# Patient Record
Sex: Female | Born: 1935
Health system: Southern US, Community
[De-identification: ages and names within clinical notes are randomized; demographics above are authoritative.]

## PROBLEM LIST (undated history)

## (undated) DIAGNOSIS — IMO0001 Reserved for inherently not codable concepts without codable children: Secondary | ICD-10-CM

## (undated) DIAGNOSIS — K5792 Diverticulitis of intestine, part unspecified, without perforation or abscess without bleeding: Secondary | ICD-10-CM

## (undated) DIAGNOSIS — I509 Heart failure, unspecified: Secondary | ICD-10-CM

## (undated) HISTORY — DX: Diverticulitis of intestine, part unspecified, without perforation or abscess without bleeding: K57.92

## (undated) HISTORY — PX: HAMMER TOE SURGERY: SHX385

## (undated) HISTORY — PX: CERVICAL CONE BIOPSY: SUR198

---

## 2008-07-22 ENCOUNTER — Emergency Department (HOSPITAL_COMMUNITY): Admission: EM | Admit: 2008-07-22 | Discharge: 2008-07-22 | Payer: Self-pay | Admitting: Emergency Medicine

## 2010-10-14 LAB — POCT I-STAT, CHEM 8
Calcium, Ion: 1.14 mmol/L (ref 1.12–1.32)
Creatinine, Ser: 1 mg/dL (ref 0.4–1.2)
Glucose, Bld: 96 mg/dL (ref 70–99)
Hemoglobin: 14.6 g/dL (ref 12.0–15.0)
Sodium: 135 mEq/L (ref 135–145)
TCO2: 25 mmol/L (ref 0–100)

## 2010-10-14 LAB — COMPREHENSIVE METABOLIC PANEL
ALT: 28 U/L (ref 0–35)
AST: 30 U/L (ref 0–37)
CO2: 26 mEq/L (ref 19–32)
Calcium: 9 mg/dL (ref 8.4–10.5)
GFR calc Af Amer: >60 mL/min (ref >60)
GFR calc non Af Amer: >60 mL/min (ref >60)
Sodium: 132 mEq/L — ABNORMAL LOW (ref 135–145)

## 2010-10-14 LAB — DIFFERENTIAL
Eosinophils Absolute: 0 10*3/uL (ref 0.0–0.7)
Eosinophils Relative: 0 % (ref 0–5)
Lymphs Abs: 1.4 10*3/uL (ref 0.7–4.0)
Monocytes Absolute: 0.5 10*3/uL (ref 0.1–1.0)
Monocytes Relative: 5 % (ref 3–12)

## 2010-10-14 LAB — POCT URINALYSIS DIP (DEVICE)
Glucose, UA: NEGATIVE mg/dL
Nitrite: NEGATIVE
Urobilinogen, UA: 1 mg/dL (ref 0.0–1.0)

## 2010-10-14 LAB — CBC
MCHC: 33.5 g/dL (ref 30.0–36.0)
RBC: 4.57 MIL/uL (ref 3.87–5.11)
WBC: 10.2 10*3/uL (ref 4.0–10.5)

## 2015-01-26 ENCOUNTER — Encounter (HOSPITAL_COMMUNITY)
Admission: RE | Admit: 2015-01-26 | Discharge: 2015-01-26 | Disposition: A | Discharge status: Home / Self Care | Payer: Medicare Other | Source: Ambulatory Visit | Attending: Orthopedic Surgery | Admitting: Orthopedic Surgery

## 2015-01-26 ENCOUNTER — Encounter (HOSPITAL_COMMUNITY): Payer: Self-pay

## 2015-01-26 DIAGNOSIS — S82002A Unspecified fracture of left patella, initial encounter for closed fracture: Secondary | ICD-10-CM | POA: Insufficient documentation

## 2015-01-26 DIAGNOSIS — X58XXXA Exposure to other specified factors, initial encounter: Secondary | ICD-10-CM | POA: Insufficient documentation

## 2015-01-26 DIAGNOSIS — Z01812 Encounter for preprocedural laboratory examination: Secondary | ICD-10-CM | POA: Insufficient documentation

## 2015-01-26 HISTORY — DX: Reserved for inherently not codable concepts without codable children: IMO0001

## 2015-01-26 LAB — URINALYSIS, ROUTINE W REFLEX MICROSCOPIC
Bilirubin Urine: NEGATIVE
Glucose, UA: NEGATIVE mg/dL
Ketones, ur: NEGATIVE mg/dL
Leukocytes, UA: NEGATIVE
Nitrite: NEGATIVE
Protein, ur: NEGATIVE mg/dL
Specific Gravity, Urine: 1.007 (ref 1.005–1.030)
UROBILINOGEN UA: 0.2 mg/dL (ref 0.0–1.0)
pH: 6.5 (ref 5.0–8.0)

## 2015-01-26 LAB — BASIC METABOLIC PANEL
ANION GAP: 5 (ref 5–15)
BUN: 14 mg/dL (ref 6–20)
CALCIUM: 9.4 mg/dL (ref 8.9–10.3)
CHLORIDE: 105 mmol/L (ref 101–111)
CO2: 28 mmol/L (ref 22–32)
Creatinine, Ser: 0.66 mg/dL (ref 0.44–1.00)
GFR calc non Af Amer: >60 mL/min (ref >60)
GLUCOSE: 100 mg/dL — AB (ref 65–99)
POTASSIUM: 4 mmol/L (ref 3.5–5.1)
SODIUM: 138 mmol/L (ref 135–145)

## 2015-01-26 LAB — CBC
HCT: 37.9 % (ref 36.0–46.0)
Hemoglobin: 12.5 g/dL (ref 12.0–15.0)
MCH: 31 pg (ref 26.0–34.0)
MCHC: 33 g/dL (ref 30.0–36.0)
MCV: 94 fL (ref 78.0–100.0)
Platelets: 290 10*3/uL (ref 150–400)
RBC: 4.03 MIL/uL (ref 3.87–5.11)
RDW: 13.1 % (ref 11.5–15.5)
WBC: 4.3 10*3/uL (ref 4.0–10.5)

## 2015-01-26 LAB — PROTIME-INR
INR: 1.04 (ref 0.00–1.49)
PROTHROMBIN TIME: 13.8 s (ref 11.6–15.2)

## 2015-01-26 LAB — URINE MICROSCOPIC-ADD ON

## 2015-01-26 LAB — ABO/RH: ABO/RH(D): O POS

## 2015-01-26 LAB — APTT: aPTT: 30 seconds (ref 24–37)

## 2015-01-26 NOTE — Patient Instructions (Addendum)
Sabrina Hernandez  01/26/2015   Your procedure is scheduled on: 01/29/2015    Report to [facility] Main  Entrance take [facility]  elevators to 3rd floor to  [place] at    0930 AM.  Call this number if you have problems the morning of surgery [phone]   Remember: ONLY 1 PERSON MAY GO WITH YOU TO SHORT STAY TO GET  READY MORNING OF [place].  Do not eat food or drink liquids :After Midnight.     Take these medicines the morning of surgery with A SIP OF WATER: none                                You may not have any metal on your body including hair pins and              piercings  Do not wear jewelry, make-up, lotions, powders or perfumes, deodorant             Do not wear nail polish.  Do not shave  48 hours prior to surgery.               Do not bring valuables to the hospital. [place].  Contacts, dentures or bridgework may not be worn into surgery.  Leave suitcase in the car. After surgery it may be brought to your room.     Special Instructions:   Coughing and deep breathing exercises, leg exercises               Please read over the following fact sheets you were given: _____________________________________________________________________             [facility] - Preparing for Surgery Before surgery, you can play an important role.  Because skin is not sterile, your skin needs to be as free of germs as possible.  You can reduce the number of germs on your skin by washing with CHG (chlorahexidine gluconate) soap before surgery.  CHG is an antiseptic cleaner which kills germs and bonds with the skin to continue killing germs even after washing. Please DO NOT use if you have an allergy to CHG or antibacterial soaps.  If your skin becomes reddened/irritated stop using the CHG and inform your nurse when you arrive at Short Stay. Do not shave (including legs and underarms) for at least 48 hours prior  to the first CHG shower.  You may shave your face/neck. Please follow these instructions carefully:  1.  Shower with CHG Soap the night before surgery and the  morning of Surgery.  2.  If you choose to wash your hair, wash your hair first as usual with your  normal  shampoo.  3.  After you shampoo, rinse your hair and body thoroughly to remove the  shampoo.                           4.  Use CHG as you would any other liquid soap.  You can apply chg directly  to the skin and wash                       Gently with a scrungie or clean washcloth.  5.  Apply the CHG Soap to your body ONLY FROM THE NECK DOWN.   Do not use on face/ open                           Wound or open sores. Avoid contact with eyes, ears mouth and genitals (private parts).                       Wash face,  Genitals (private parts) with your normal soap.             6.  Wash thoroughly, paying special attention to the area where your surgery  will be performed.  7.  Thoroughly rinse your body with warm water from the neck down.  8.  DO NOT shower/wash with your normal soap after using and rinsing off  the CHG Soap.                9.  Pat yourself dry with a clean towel.            10.  Wear clean pajamas.            11.  Place clean sheets on your bed the night of your first shower and do not  sleep with pets. Day of Surgery : Do not apply any lotions/deodorants the morning of surgery.  Please wear clean clothes to the hospital/surgery center.  FAILURE TO FOLLOW THESE INSTRUCTIONS MAY RESULT IN THE CANCELLATION OF YOUR SURGERY PATIENT SIGNATURE_________________________________  NURSE SIGNATURE__________________________________  ________________________________________________________________________  WHAT IS A BLOOD TRANSFUSION? Blood Transfusion Information  A transfusion is the replacement of blood or some of its parts. Blood is made up of multiple cells which provide different functions.  Red blood cells carry oxygen  and are used for blood loss replacement.  White blood cells fight against infection.  Platelets control bleeding.  Plasma helps clot blood.  Other blood products are available for specialized needs, such as hemophilia or other clotting disorders. BEFORE THE TRANSFUSION  Who gives blood for transfusions?   Healthy volunteers who are fully evaluated to make sure their blood is safe. This is blood bank blood. Transfusion therapy is the safest it has ever been in the practice of medicine. Before blood is taken from a donor, a complete history is taken to make sure that person has no history of diseases nor engages in risky social behavior (examples are intravenous drug use or sexual activity with multiple partners). The donor's travel history is screened to minimize risk of transmitting infections, such as malaria. The donated blood is tested for signs of infectious diseases, such as HIV and hepatitis. The blood is then tested to be sure it is compatible with you in order to minimize the chance of a transfusion reaction. If you or a relative donates blood, this is often done in anticipation of surgery and is not appropriate for emergency situations. It takes many days to process the donated blood. RISKS AND COMPLICATIONS Although transfusion therapy is very safe and saves many lives, the main dangers of transfusion include:  1. Getting an infectious disease. 2. Developing a transfusion reaction. This is an allergic reaction to something in the blood you were given. Every precaution is taken to prevent this. The decision to have a blood transfusion has been considered carefully by your caregiver before blood is given. Blood is not given unless the benefits outweigh the risks. AFTER THE TRANSFUSION  Right after  receiving a blood transfusion, you will usually feel much better and more energetic. This is especially true if your red blood cells have gotten low (anemic). The transfusion raises the level  of the red blood cells which carry oxygen, and this usually causes an energy increase.  The nurse administering the transfusion will monitor you carefully for complications. HOME CARE INSTRUCTIONS  No special instructions are needed after a transfusion. You may find your energy is better. Speak with your caregiver about any limitations on activity for underlying diseases you may have. SEEK MEDICAL CARE IF:   Your condition is not improving after your transfusion.  You develop redness or irritation at the intravenous (IV) site. SEEK IMMEDIATE MEDICAL CARE IF:  Any of the following symptoms occur over the next 12 hours:  Shaking chills.  You have a temperature by mouth above 102 F (38.9 C), not controlled by medicine.  Chest, back, or muscle pain.  People around you feel you are not acting correctly or are confused.  Shortness of breath or difficulty breathing.  Dizziness and fainting.  You get a rash or develop hives.  You have a decrease in urine output.  Your urine turns a dark color or changes to pink, red, or brown. Any of the following symptoms occur over the next 10 days:  You have a temperature by mouth above 102 F (38.9 C), not controlled by medicine.  Shortness of breath.  Weakness after normal activity.  The white part of the eye turns yellow (jaundice).  You have a decrease in the amount of urine or are urinating less often.  Your urine turns a dark color or changes to pink, red, or brown. Document Released: 06/13/2000 Document Revised: 09/08/2011 Document Reviewed: 01/31/2008 ExitCare Patient Information 2014 [place].  _______________________________________________________________________  Incentive Spirometer  An incentive spirometer is a tool that can help keep your lungs clear and active. This tool measures how well you are filling your lungs with each breath. Taking long deep breaths may help reverse or decrease the chance of developing  breathing (pulmonary) problems (especially infection) following:  A long period of time when you are unable to move or be active. BEFORE THE PROCEDURE   If the spirometer includes an indicator to show your best effort, your nurse or respiratory therapist will set it to a desired goal.  If possible, sit up straight or lean slightly forward. Try not to slouch.  Hold the incentive spirometer in an upright position. INSTRUCTIONS FOR USE  3. Sit on the edge of your bed if possible, or sit up as far as you can in bed or on a chair. 4. Hold the incentive spirometer in an upright position. 5. Breathe out normally. 6. Place the mouthpiece in your mouth and seal your lips tightly around it. 7. Breathe in slowly and as deeply as possible, raising the piston or the ball toward the top of the column. 8. Hold your breath for 3-5 seconds or for as long as possible. Allow the piston or ball to fall to the bottom of the column. 9. Remove the mouthpiece from your mouth and breathe out normally. 10. Rest for a few seconds and repeat Steps 1 through 7 at least 10 times every 1-2 hours when you are awake. Take your time and take a few normal breaths between deep breaths. 11. The spirometer may include an indicator to show your best effort. Use the indicator as a goal to work toward during each repetition. 12. After each set  of 10 deep breaths, practice coughing to be sure your lungs are clear. If you have an incision (the cut made at the time of surgery), support your incision when coughing by placing a pillow or rolled up towels firmly against it. Once you are able to get out of bed, walk around indoors and cough well. You may stop using the incentive spirometer when instructed by your caregiver.  RISKS AND COMPLICATIONS  Take your time so you do not get dizzy or light-headed.  If you are in pain, you may need to take or ask for pain medication before doing incentive spirometry. It is harder to take a deep  breath if you are having pain. AFTER USE  Rest and breathe slowly and easily.  It can be helpful to keep track of a log of your progress. Your caregiver can provide you with a simple table to help with this. If you are using the spirometer at home, follow these instructions: [place] IF:   You are having difficultly using the spirometer.  You have trouble using the spirometer as often as instructed.  Your pain medication is not giving enough relief while using the spirometer.  You develop fever of 100.5 F (38.1 C) or higher. SEEK IMMEDIATE MEDICAL CARE IF:   You cough up bloody sputum that had not been present before.  You develop fever of 102 F (38.9 C) or greater.  You develop worsening pain at or near the incision site. MAKE SURE YOU:   Understand these instructions.  Will watch your condition.  Will get help right away if you are not doing well or get worse. Document Released: 10/27/2006 Document Revised: 09/08/2011 Document Reviewed: 12/28/2006 [facility] Patient Information 2014 [place], [place].   ________________________________________________________________________
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
## 2015-01-29 ENCOUNTER — Ambulatory Visit (HOSPITAL_COMMUNITY): Payer: Medicare Other

## 2015-01-29 ENCOUNTER — Encounter (HOSPITAL_COMMUNITY): Payer: Self-pay | Admitting: *Deleted

## 2015-01-29 ENCOUNTER — Encounter (HOSPITAL_COMMUNITY)
Admission: RE | Disposition: A | Discharge status: Home Health | Payer: Self-pay | Source: Ambulatory Visit | Attending: Orthopedic Surgery

## 2015-01-29 ENCOUNTER — Inpatient Hospital Stay (HOSPITAL_COMMUNITY)
Admission: RE | Admit: 2015-01-29 | Discharge: 2015-01-30 | DRG: 517 | Disposition: A | Discharge status: Home Health | Payer: Medicare Other | Source: Ambulatory Visit | Attending: Orthopedic Surgery | Admitting: Orthopedic Surgery

## 2015-01-29 ENCOUNTER — Ambulatory Visit (HOSPITAL_COMMUNITY): Payer: Medicare Other | Admitting: Certified Registered Nurse Anesthetist

## 2015-01-29 DIAGNOSIS — S82042A Displaced comminuted fracture of left patella, initial encounter for closed fracture: Principal | ICD-10-CM | POA: Diagnosis present

## 2015-01-29 DIAGNOSIS — S82002S Unspecified fracture of left patella, sequela: Secondary | ICD-10-CM

## 2015-01-29 DIAGNOSIS — Z87891 Personal history of nicotine dependence: Secondary | ICD-10-CM | POA: Diagnosis not present

## 2015-01-29 DIAGNOSIS — T148XXA Other injury of unspecified body region, initial encounter: Secondary | ICD-10-CM

## 2015-01-29 DIAGNOSIS — Z7982 Long term (current) use of aspirin: Secondary | ICD-10-CM | POA: Diagnosis not present

## 2015-01-29 DIAGNOSIS — S82002A Unspecified fracture of left patella, initial encounter for closed fracture: Secondary | ICD-10-CM | POA: Diagnosis present

## 2015-01-29 DIAGNOSIS — Z01812 Encounter for preprocedural laboratory examination: Secondary | ICD-10-CM | POA: Diagnosis not present

## 2015-01-29 HISTORY — PX: ORIF PATELLA: SHX5033

## 2015-01-29 LAB — TYPE AND SCREEN
ABO/RH(D): O POS
ANTIBODY SCREEN: NEGATIVE

## 2015-01-29 SURGERY — OPEN REDUCTION INTERNAL FIXATION (ORIF) PATELLA
Anesthesia: Spinal | Site: Knee | Laterality: Left

## 2015-01-29 MED ORDER — DEXTROSE 5 % IV SOLN
500.0000 mg | Freq: Four times a day (QID) | INTRAVENOUS | Status: DC | PRN
  Filled 2015-01-29: qty 5

## 2015-01-29 MED ORDER — PROPOFOL 10 MG/ML IV BOLUS
INTRAVENOUS | Status: AC
  Filled 2015-01-29: qty 20

## 2015-01-29 MED ORDER — LIDOCAINE HCL (CARDIAC) 20 MG/ML IV SOLN
INTRAVENOUS | Status: DC | PRN
  Administered 2015-01-29: 50 mg via INTRAVENOUS

## 2015-01-29 MED ORDER — CARBOXYMETHYLCELLULOSE SODIUM 0.5 % OP SOLN: 2.0000 [drp] | Freq: Two times a day (BID) | OPHTHALMIC | Status: DC | PRN

## 2015-01-29 MED ORDER — POLYETHYLENE GLYCOL 3350 17 G PO PACK
17.0000 g | PACK | Freq: Two times a day (BID) | ORAL | Status: DC
  Administered 2015-01-30: 17 g via ORAL

## 2015-01-29 MED ORDER — SODIUM CHLORIDE 0.9 % IJ SOLN
INTRAMUSCULAR | Status: DC | PRN
  Administered 2015-01-29: 30 mL

## 2015-01-29 MED ORDER — CEFAZOLIN SODIUM-DEXTROSE 2-3 GM-% IV SOLR
INTRAVENOUS | Status: AC
  Filled 2015-01-29: qty 50

## 2015-01-29 MED ORDER — FERROUS SULFATE 325 (65 FE) MG PO TABS
325.0000 mg | ORAL_TABLET | Freq: Three times a day (TID) | ORAL | Status: DC
  Administered 2015-01-30: 325 mg via ORAL
  Filled 2015-01-29 (×5): qty 1

## 2015-01-29 MED ORDER — PROPOFOL 10 MG/ML IV BOLUS
INTRAVENOUS | Status: DC | PRN
  Administered 2015-01-29 (×2): 20 mg via INTRAVENOUS
  Administered 2015-01-29: 10 mg via INTRAVENOUS

## 2015-01-29 MED ORDER — PROPOFOL INFUSION 10 MG/ML OPTIME
INTRAVENOUS | Status: DC | PRN
  Administered 2015-01-29: 180 ug/kg/min via INTRAVENOUS

## 2015-01-29 MED ORDER — PHENOL 1.4 % MT LIQD
1.0000 | OROMUCOSAL | Status: DC | PRN
  Filled 2015-01-29: qty 177

## 2015-01-29 MED ORDER — BUPIVACAINE IN DEXTROSE 0.75-8.25 % IT SOLN
INTRATHECAL | Status: DC | PRN
  Administered 2015-01-29: 2 mL via INTRATHECAL

## 2015-01-29 MED ORDER — ONDANSETRON HCL 4 MG/2ML IJ SOLN
4.0000 mg | Freq: Four times a day (QID) | INTRAMUSCULAR | Status: DC | PRN
  Filled 2015-01-29: qty 2

## 2015-01-29 MED ORDER — HYDROCODONE-ACETAMINOPHEN 7.5-325 MG PO TABS
1.0000 | ORAL_TABLET | ORAL | Status: DC
  Administered 2015-01-29 – 2015-01-30 (×6): 1 via ORAL
  Filled 2015-01-29 (×5): qty 1
  Filled 2015-01-29: qty 2

## 2015-01-29 MED ORDER — LACTATED RINGERS IV SOLN
INTRAVENOUS | Status: DC
  Administered 2015-01-29: 1000 mL via INTRAVENOUS
  Administered 2015-01-29: 14:00:00 via INTRAVENOUS

## 2015-01-29 MED ORDER — ALUM & MAG HYDROXIDE-SIMETH 200-200-20 MG/5ML PO SUSP: 30.0000 mL | ORAL | Status: DC | PRN

## 2015-01-29 MED ORDER — LIDOCAINE HCL (CARDIAC) 20 MG/ML IV SOLN
INTRAVENOUS | Status: AC
  Filled 2015-01-29: qty 5

## 2015-01-29 MED ORDER — RIVAROXABAN 10 MG PO TABS
10.0000 mg | ORAL_TABLET | ORAL | Status: DC
  Administered 2015-01-30: 10 mg via ORAL
  Filled 2015-01-29 (×2): qty 1

## 2015-01-29 MED ORDER — CHLORHEXIDINE GLUCONATE 4 % EX LIQD: 60.0000 mL | Freq: Once | CUTANEOUS | Status: DC

## 2015-01-29 MED ORDER — HYDROMORPHONE HCL 1 MG/ML IJ SOLN
0.5000 mg | INTRAMUSCULAR | Status: DC | PRN
  Administered 2015-01-29 (×2): 1 mg via INTRAVENOUS
  Filled 2015-01-29 (×2): qty 1

## 2015-01-29 MED ORDER — CELECOXIB 200 MG PO CAPS
200.0000 mg | ORAL_CAPSULE | Freq: Two times a day (BID) | ORAL | Status: DC
  Administered 2015-01-29 – 2015-01-30 (×2): 200 mg via ORAL
  Filled 2015-01-29 (×3): qty 1

## 2015-01-29 MED ORDER — HYDROMORPHONE HCL 1 MG/ML IJ SOLN: 0.2500 mg | INTRAMUSCULAR | Status: DC | PRN

## 2015-01-29 MED ORDER — KETOROLAC TROMETHAMINE 30 MG/ML IJ SOLN
INTRAMUSCULAR | Status: AC
  Filled 2015-01-29: qty 1

## 2015-01-29 MED ORDER — DEXAMETHASONE SODIUM PHOSPHATE 10 MG/ML IJ SOLN: 10.0000 mg | Freq: Once | INTRAMUSCULAR | Status: DC

## 2015-01-29 MED ORDER — DOCUSATE SODIUM 100 MG PO CAPS
100.0000 mg | ORAL_CAPSULE | Freq: Two times a day (BID) | ORAL | Status: DC
  Administered 2015-01-29 – 2015-01-30 (×2): 100 mg via ORAL

## 2015-01-29 MED ORDER — CEFAZOLIN SODIUM-DEXTROSE 2-3 GM-% IV SOLR
2.0000 g | Freq: Four times a day (QID) | INTRAVENOUS | Status: AC
  Administered 2015-01-29 – 2015-01-30 (×2): 2 g via INTRAVENOUS
  Filled 2015-01-29 (×2): qty 50

## 2015-01-29 MED ORDER — TRANEXAMIC ACID 1000 MG/10ML IV SOLN
1000.0000 mg | Freq: Once | INTRAVENOUS | Status: AC
  Administered 2015-01-29: 1000 mg via INTRAVENOUS
  Filled 2015-01-29: qty 10

## 2015-01-29 MED ORDER — METHOCARBAMOL 500 MG PO TABS: 500.0000 mg | ORAL_TABLET | Freq: Four times a day (QID) | ORAL | Status: DC | PRN

## 2015-01-29 MED ORDER — EPHEDRINE SULFATE 50 MG/ML IJ SOLN
INTRAMUSCULAR | Status: DC | PRN
  Administered 2015-01-29 (×2): 5 mg via INTRAVENOUS

## 2015-01-29 MED ORDER — DEXAMETHASONE SODIUM PHOSPHATE 10 MG/ML IJ SOLN
INTRAMUSCULAR | Status: AC
  Filled 2015-01-29: qty 1

## 2015-01-29 MED ORDER — METOCLOPRAMIDE HCL 10 MG PO TABS
5.0000 mg | ORAL_TABLET | Freq: Three times a day (TID) | ORAL | Status: DC | PRN
  Filled 2015-01-29: qty 2

## 2015-01-29 MED ORDER — ONDANSETRON HCL 4 MG PO TABS: 4.0000 mg | ORAL_TABLET | Freq: Four times a day (QID) | ORAL | Status: DC | PRN

## 2015-01-29 MED ORDER — BISACODYL 10 MG RE SUPP: 10.0000 mg | Freq: Every day | RECTAL | Status: DC | PRN

## 2015-01-29 MED ORDER — MAGNESIUM CITRATE PO SOLN: 1.0000 | Freq: Once | ORAL | Status: AC | PRN

## 2015-01-29 MED ORDER — POLYVINYL ALCOHOL 1.4 % OP SOLN
2.0000 [drp] | Freq: Two times a day (BID) | OPHTHALMIC | Status: DC | PRN
  Filled 2015-01-29: qty 15

## 2015-01-29 MED ORDER — MENTHOL 3 MG MT LOZG
1.0000 | LOZENGE | OROMUCOSAL | Status: DC | PRN
Start: 2015-01-29 — End: 2015-01-30

## 2015-01-29 MED ORDER — HYPROMELLOSE (GONIOSCOPIC) 2.5 % OP SOLN
2.0000 [drp] | Freq: Two times a day (BID) | OPHTHALMIC | Status: DC | PRN
  Filled 2015-01-29: qty 15

## 2015-01-29 MED ORDER — SODIUM CHLORIDE 0.9 % IV SOLN
INTRAVENOUS | Status: DC
  Administered 2015-01-29: 16:00:00 via INTRAVENOUS
  Filled 2015-01-29 (×8): qty 1000

## 2015-01-29 MED ORDER — BUPIVACAINE-EPINEPHRINE (PF) 0.25% -1:200000 IJ SOLN
INTRAMUSCULAR | Status: AC
  Filled 2015-01-29: qty 30

## 2015-01-29 MED ORDER — EPHEDRINE SULFATE 50 MG/ML IJ SOLN
INTRAMUSCULAR | Status: AC
  Filled 2015-01-29: qty 1

## 2015-01-29 MED ORDER — DEXAMETHASONE SODIUM PHOSPHATE 10 MG/ML IJ SOLN
10.0000 mg | Freq: Once | INTRAMUSCULAR | Status: AC
  Administered 2015-01-30: 10 mg via INTRAVENOUS
  Filled 2015-01-29: qty 1

## 2015-01-29 MED ORDER — DEXAMETHASONE SODIUM PHOSPHATE 10 MG/ML IJ SOLN
INTRAMUSCULAR | Status: DC | PRN
  Administered 2015-01-29: 10 mg via INTRAVENOUS

## 2015-01-29 MED ORDER — METOCLOPRAMIDE HCL 5 MG/ML IJ SOLN: 5.0000 mg | Freq: Three times a day (TID) | INTRAMUSCULAR | Status: DC | PRN

## 2015-01-29 MED ORDER — DIPHENHYDRAMINE HCL 25 MG PO CAPS: 25.0000 mg | ORAL_CAPSULE | Freq: Four times a day (QID) | ORAL | Status: DC | PRN

## 2015-01-29 MED ORDER — SODIUM CHLORIDE 0.9 % IJ SOLN
INTRAMUSCULAR | Status: AC
  Filled 2015-01-29: qty 50

## 2015-01-29 MED ORDER — KETOROLAC TROMETHAMINE 30 MG/ML IJ SOLN
INTRAMUSCULAR | Status: DC | PRN
  Administered 2015-01-29: 30 mg

## 2015-01-29 MED ORDER — CEFAZOLIN SODIUM-DEXTROSE 2-3 GM-% IV SOLR
2.0000 g | INTRAVENOUS | Status: AC
  Administered 2015-01-29: 2 g via INTRAVENOUS

## 2015-01-29 MED ORDER — BUPIVACAINE-EPINEPHRINE (PF) 0.25% -1:200000 IJ SOLN
INTRAMUSCULAR | Status: DC | PRN
  Administered 2015-01-29: 30 mL

## 2015-01-29 SURGICAL SUPPLY — 57 items
BAG ZIPLOCK 12X15 (MISCELLANEOUS) ×2 IMPLANT
BANDAGE ELASTIC 6 VELCRO ST LF (GAUZE/BANDAGES/DRESSINGS) ×2 IMPLANT
BANDAGE ESMARK 6X9 LF (GAUZE/BANDAGES/DRESSINGS) ×1 IMPLANT
BNDG COHESIVE 6X5 TAN STRL LF (GAUZE/BANDAGES/DRESSINGS) ×2 IMPLANT
BNDG ESMARK 6X9 LF (GAUZE/BANDAGES/DRESSINGS) ×2
BNDG GAUZE ELAST 4 BULKY (GAUZE/BANDAGES/DRESSINGS) IMPLANT
CUFF TOURN SGL QUICK 34 (TOURNIQUET CUFF)
CUFF TOURN SGL QUICK 44 (TOURNIQUET CUFF) IMPLANT
CUFF TRNQT CYL 34X4X40X1 (TOURNIQUET CUFF) IMPLANT
DECANTER SPIKE VIAL GLASS SM (MISCELLANEOUS) ×2 IMPLANT
DRAPE C-ARM 42X120 X-RAY (DRAPES) ×2 IMPLANT
DRAPE C-ARMOR (DRAPES) ×2 IMPLANT
DRAPE INCISE IOBAN 66X45 STRL (DRAPES) ×2 IMPLANT
DRAPE U-SHAPE 47X51 STRL (DRAPES) ×2 IMPLANT
DRSG AQUACEL AG ADV 3.5X10 (GAUZE/BANDAGES/DRESSINGS) ×2 IMPLANT
DRSG EMULSION OIL 3X16 NADH (GAUZE/BANDAGES/DRESSINGS) IMPLANT
DRSG PAD ABDOMINAL 8X10 ST (GAUZE/BANDAGES/DRESSINGS) IMPLANT
DURAPREP 26ML APPLICATOR (WOUND CARE) ×2 IMPLANT
ELECT REM PT RETURN 9FT ADLT (ELECTROSURGICAL) ×2
ELECTRODE REM PT RTRN 9FT ADLT (ELECTROSURGICAL) ×1 IMPLANT
GAUZE SPONGE 4X4 12PLY STRL (GAUZE/BANDAGES/DRESSINGS) IMPLANT
GLOVE BIOGEL PI IND STRL 7.5 (GLOVE) ×1 IMPLANT
GLOVE BIOGEL PI IND STRL 8.5 (GLOVE) ×1 IMPLANT
GLOVE BIOGEL PI INDICATOR 7.5 (GLOVE) ×1
GLOVE BIOGEL PI INDICATOR 8.5 (GLOVE) ×1
GLOVE ECLIPSE 8.0 STRL XLNG CF (GLOVE) IMPLANT
GLOVE ORTHO TXT STRL SZ7.5 (GLOVE) ×4 IMPLANT
GLOVE SURG ORTHO 8.0 STRL STRW (GLOVE) ×2 IMPLANT
GOWN SPEC L3 XXLG W/TWL (GOWN DISPOSABLE) ×4 IMPLANT
GOWN STRL REUS W/TWL LRG LVL3 (GOWN DISPOSABLE) ×2 IMPLANT
IMMOBILIZER KNEE 20 (SOFTGOODS) ×2 IMPLANT
IV CATH 14GX2 1/4 (CATHETERS) ×4 IMPLANT
LIQUID BAND (GAUZE/BANDAGES/DRESSINGS) ×2 IMPLANT
MANIFOLD NEPTUNE II (INSTRUMENTS) ×2 IMPLANT
NS IRRIG 1000ML POUR BTL (IV SOLUTION) ×2 IMPLANT
PACK TOTAL JOINT (CUSTOM PROCEDURE TRAY) ×2 IMPLANT
PAD CAST 4YDX4 CTTN HI CHSV (CAST SUPPLIES) IMPLANT
PADDING CAST COTTON 4X4 STRL (CAST SUPPLIES)
PASSER SUT SWANSON 36MM LOOP (INSTRUMENTS) IMPLANT
POSITIONER SURGICAL ARM (MISCELLANEOUS) ×2 IMPLANT
SPONGE LAP 18X18 X RAY DECT (DISPOSABLE) IMPLANT
SPONGE LAP 4X18 X RAY DECT (DISPOSABLE) IMPLANT
STAPLER VISISTAT (STAPLE) IMPLANT
SUT ETHIBOND NAB CT1 #1 30IN (SUTURE) ×4 IMPLANT
SUT FIBERWIRE #2 38 T-5 BLUE (SUTURE)
SUT FIBERWIRE #5 38 BLUE (WIRE) ×2 IMPLANT
SUT VIC AB 0 CT1 27 (SUTURE) ×2
SUT VIC AB 0 CT1 27XBRD ANTBC (SUTURE) ×2 IMPLANT
SUT VIC AB 1 CT1 27 (SUTURE) ×2
SUT VIC AB 1 CT1 27XBRD ANTBC (SUTURE) ×2 IMPLANT
SUT VIC AB 2-0 CT1 27 (SUTURE) ×2
SUT VIC AB 2-0 CT1 TAPERPNT 27 (SUTURE) ×2 IMPLANT
SUT WIRE 4-0 14IN SS (SUTURE) ×2 IMPLANT
SUTURE FIBERWR #2 38 T-5 BLUE (SUTURE) IMPLANT
TOWEL OR 17X26 10 PK STRL BLUE (TOWEL DISPOSABLE) ×2 IMPLANT
WATER STERILE IRR 1500ML POUR (IV SOLUTION) IMPLANT
WRAP KNEE MAXI GEL POST OP (GAUZE/BANDAGES/DRESSINGS) ×2 IMPLANT

## 2015-01-29 NOTE — Interval H&P Note (Signed)
History and Physical Interval Note:  01/29/2015 12:49 PM  Sabrina Hernandez  has presented today for surgery, with the diagnosis of LEFT PATELLA FRACTURE   The various methods of treatment have been discussed with the patient and family. After consideration of risks, benefits and other options for treatment, the patient has consented to  Procedure(s): OPEN REDUCTION INTERNAL (ORIF) FIXATION LEFT PATELLA (Left) as a surgical intervention .  The patient's history has been reviewed, patient examined, no change in status, stable for surgery.  I have reviewed the patient's chart and labs.  Questions were answered to the patient's satisfaction.     Mauri Pole

## 2015-01-29 NOTE — Anesthesia Procedure Notes (Signed)
Spinal Patient location during procedure: OR Staffing Anesthesiologist: Franne Grip Performed by: anesthesiologist  Preanesthetic Checklist Completed: patient identified, site marked, surgical consent, pre-op evaluation, timeout performed, IV checked, risks and benefits discussed and monitors and equipment checked Spinal Block Patient position: sitting Prep: Betadine Patient monitoring: heart rate, continuous pulse ox and blood pressure Approach: midline Location: L3-4 Injection technique: single-shot Needle Needle type: Spinocan  Needle gauge: 22 G Needle length: 9 cm Additional Notes Expiration date of kit checked and confirmed. Patient tolerated procedure well, without complications. CSF clear. No paresthesia. Meaningful verbal contact maintained throughout spinal placement.

## 2015-01-29 NOTE — Anesthesia Postprocedure Evaluation (Signed)
  Anesthesia Post-op Note  Patient: Sabrina Hernandez  Procedure(s) Performed: Procedure(s): OPEN REDUCTION INTERNAL (ORIF) FIXATION LEFT PATELLA (Left)  Patient Location: PACU  Anesthesia Type: Spinal/MAC  Level of Consciousness: awake and alert   Airway and Oxygen Therapy: Patient Spontanous Breathing  Post-op Pain: Controlled  Post-op Assessment: Post-op Vital signs reviewed, Patient's Cardiovascular Status Stable and Respiratory Function Stable. Block receeding.  Post-op Vital Signs: Reviewed  Filed Vitals:   01/29/15 1545  BP: 139/58  Pulse: 82  Temp:   Resp: 19    Complications: No apparent anesthesia complications

## 2015-01-29 NOTE — Progress Notes (Signed)
Orthopedic Tech Progress Note Patient Details:  Sabrina Hernandez 03-Jun-1935 [id number]  Patient ID: Sabrina Hernandez, female   DOB: October 04, 1934, 80 y.o.   MRN: [id number]   Sabrina Hernandez 01/29/2015, 6:22 PMCalled [employer] for bledsoe brace.

## 2015-01-29 NOTE — Brief Op Note (Addendum)
01/29/2015  12:51 PM  PATIENT:  Sabrina Hernandez  80 y.o. female  PRE-OPERATIVE DIAGNOSIS:  Comminuted closed LEFT PATELLA FRACTURE   POST-OPERATIVE DIAGNOSIS:  Comminuted closed LEFT PATELLA FRACTURE  PROCEDURE:  Procedure(s): OPEN REDUCTION INTERNAL (ORIF) FIXATION LEFT PATELLA (Left)  SURGEON:  Surgeon(s) and Role:    * Paralee Cancel, MD - Primary  PHYSICIAN ASSISTANT: Danae Orleans, PA-C  ANESTHESIA:   spinal  EBL:   Minimal  BLOOD ADMINISTERED:none  DRAINS: none   LOCAL MEDICATIONS USED:  MARCAINE     SPECIMEN:  No Specimen  DISPOSITION OF SPECIMEN:  N/A  COUNTS:  YES  TOURNIQUET:  55min @ 268mmHg  DICTATION: .Other Dictation: Dictation Number [id number]  PLAN OF CARE: Admit for overnight observation  PATIENT DISPOSITION:  PACU - hemodynamically stable.   Delay start of Pharmacological VTE agent (>24hrs) due to surgical blood loss or risk of bleeding: no

## 2015-01-29 NOTE — Transfer of Care (Addendum)
Immediate Anesthesia Transfer of Care Note  Patient: Sabrina Hernandez  Procedure(s) Performed: Procedure(s): OPEN REDUCTION INTERNAL (ORIF) FIXATION LEFT PATELLA (Left)  Patient Location: PACU  Anesthesia Type:MAC and Spinal  Level of Consciousness: Patient easily awoken, sedated, comfortable, cooperative, following commands, responds to stimulation.   Airway & Oxygen Therapy: Patient spontaneously breathing, ventilating well, oxygen via simple oxygen mask.  Post-op Assessment: Report given to PACU RN, vital signs reviewed and stable, spinal level T12.   Post vital signs: Reviewed and stable.  Complications: No apparent anesthesia complications

## 2015-01-29 NOTE — Anesthesia Preprocedure Evaluation (Addendum)
Anesthesia Evaluation  Patient identified by MRN, date of birth, ID band Patient awake    Reviewed: Allergy & Precautions, NPO status , Patient's Chart, lab work & pertinent test results  Airway Mallampati: II  TM Distance: >3 FB Neck ROM: Full    Dental no notable dental hx.    Pulmonary shortness of breath, former smoker,  breath sounds clear to auscultation  Pulmonary exam normal       Cardiovascular negative cardio ROS Normal cardiovascular examRhythm:Regular Rate:Normal     Neuro/Psych negative neurological ROS  negative psych ROS   GI/Hepatic negative GI ROS, Neg liver ROS,   Endo/Other  negative endocrine ROS  Renal/GU negative Renal ROS  negative genitourinary   Musculoskeletal negative musculoskeletal ROS (+)   Abdominal   Peds negative pediatric ROS (+)  Hematology negative hematology ROS (+)   Anesthesia Other Findings   Reproductive/Obstetrics negative OB ROS                            Anesthesia Physical Anesthesia Plan  ASA: II  Anesthesia Plan: Spinal   Post-op Pain Management:    Induction: Intravenous  Airway Management Planned:   Additional Equipment:   Intra-op Plan:   Post-operative Plan: Extubation in OR  Informed Consent: I have reviewed the patients History and Physical, chart, labs and discussed the procedure including the risks, benefits and alternatives for the proposed anesthesia with the patient or authorized representative who has indicated his/her understanding and acceptance.   Dental advisory given  Plan Discussed with: CRNA  Anesthesia Plan Comments: (Discussed risks and benefits of and differences between spinal and general. Discussed risks of spinal including headache, backache, failure, bleeding and hematoma, infection, and nerve damage and paralysis. Patient consents to spinal. Questions answered. Coagulation studies and platelet count  acceptable. )       Anesthesia Quick Evaluation

## 2015-01-30 ENCOUNTER — Encounter (HOSPITAL_COMMUNITY): Payer: Self-pay | Admitting: Orthopedic Surgery

## 2015-01-30 LAB — CBC
HCT: 34.3 % — ABNORMAL LOW (ref 36.0–46.0)
HEMOGLOBIN: 11.5 g/dL — AB (ref 12.0–15.0)
MCH: 31.1 pg (ref 26.0–34.0)
MCHC: 33.5 g/dL (ref 30.0–36.0)
MCV: 92.7 fL (ref 78.0–100.0)
Platelets: 319 10*3/uL (ref 150–400)
RBC: 3.7 MIL/uL — ABNORMAL LOW (ref 3.87–5.11)
RDW: 13 % (ref 11.5–15.5)
WBC: 6.5 10*3/uL (ref 4.0–10.5)

## 2015-01-30 LAB — BASIC METABOLIC PANEL
Anion gap: 8 (ref 5–15)
BUN: 12 mg/dL (ref 6–20)
CHLORIDE: 102 mmol/L (ref 101–111)
CO2: 25 mmol/L (ref 22–32)
Calcium: 9 mg/dL (ref 8.9–10.3)
Creatinine, Ser: 0.53 mg/dL (ref 0.44–1.00)
GFR calc Af Amer: >60 mL/min (ref >60)
GFR calc non Af Amer: >60 mL/min (ref >60)
GLUCOSE: 127 mg/dL — AB (ref 65–99)
Potassium: 4.6 mmol/L (ref 3.5–5.1)
Sodium: 135 mmol/L (ref 135–145)

## 2015-01-30 MED ORDER — TIZANIDINE HCL 4 MG PO TABS: 4.0000 mg | ORAL_TABLET | Freq: Four times a day (QID) | ORAL | Status: DC | PRN

## 2015-01-30 MED ORDER — DOCUSATE SODIUM 100 MG PO CAPS: 100.0000 mg | ORAL_CAPSULE | Freq: Two times a day (BID) | ORAL | Status: DC

## 2015-01-30 MED ORDER — HYDROCODONE-ACETAMINOPHEN 7.5-325 MG PO TABS: 1.0000 | ORAL_TABLET | ORAL | Status: DC | PRN

## 2015-01-30 MED ORDER — RIVAROXABAN 10 MG PO TABS: 10.0000 mg | ORAL_TABLET | ORAL | Status: DC

## 2015-01-30 MED ORDER — POLYETHYLENE GLYCOL 3350 17 G PO PACK: 17.0000 g | PACK | Freq: Two times a day (BID) | ORAL | Status: DC

## 2015-01-30 MED ORDER — FERROUS SULFATE 325 (65 FE) MG PO TABS: 325.0000 mg | ORAL_TABLET | Freq: Three times a day (TID) | ORAL | Status: DC

## 2015-01-30 NOTE — Discharge Instructions (Signed)
Information on my medicine - XARELTO (Rivaroxaban)  This medication education was reviewed with me or my healthcare representative as part of my discharge preparation.  The pharmacist that spoke with me during my hospital stay was:  Rudean Haskell, [facility]  Why was Xarelto prescribed for you? Xarelto was prescribed for you to reduce the risk of blood clots forming after orthopedic surgery. The medical term for these abnormal blood clots is venous thromboembolism (VTE).  What do you need to know about xarelto ? Take your Xarelto ONCE DAILY at the same time every day. You may take it either with or without food.  If you have difficulty swallowing the tablet whole, you may crush it and mix in applesauce just prior to taking your dose.  Take Xarelto exactly as prescribed by your doctor and DO NOT stop taking Xarelto without talking to the doctor who prescribed the medication.  Stopping without other VTE prevention medication to take the place of Xarelto may increase your risk of developing a clot.  After discharge, you should have regular check-up appointments with your healthcare provider that is prescribing your Xarelto.    What do you do if you miss a dose? If you miss a dose, take it as soon as you remember on the same day then continue your regularly scheduled once daily regimen the next day. Do not take two doses of Xarelto on the same day.   Important Safety Information A possible side effect of Xarelto is bleeding. You should call your healthcare provider right away if you experience any of the following: ? Bleeding from an injury or your nose that does not stop. ? Unusual colored urine (red or dark brown) or unusual colored stools (red or black). ? Unusual bruising for unknown reasons. ? A serious fall or if you hit your head (even if there is no bleeding).  Some medicines may interact with Xarelto and might increase your risk of bleeding while on Xarelto. To help avoid  this, consult your healthcare provider or pharmacist prior to using any new prescription or non-prescription medications, including herbals, vitamins, non-steroidal anti-inflammatory drugs (NSAIDs) and supplements.  This website has more information on Xarelto: [email or web address]

## 2015-01-30 NOTE — Progress Notes (Signed)
Physical Therapy Treatment Patient Details Name: Sabrina Hernandez MRN: [id number] DOB: 1934-12-07 Today's Date: 01/30/2015    History of Present Illness L patella fx, s/p ORIF    PT Comments    Practiced steps. Feels better.  Follow Up Recommendations  Home health PT     Equipment Recommendations  Rolling walker with 5" wheels;3in1 (PT)    Recommendations for Other Services       Precautions / Restrictions Precautions Precaution Comments: no L knee flexion Required Braces or Orthoses: Other Brace/Splint (hinged , locked into extension) Knee Immobilizer - Left: On at all times    Mobility  Bed Mobility                  Transfers     Transfers: Sit to/from Stand Sit to Stand: Min guard         General transfer comment: verbal cues for hand placement  Ambulation/Gait Ambulation/Gait assistance: Min guard Ambulation Distance (Feet): 20 Feet Assistive device: Rolling walker (2 wheeled) Gait Pattern/deviations: Step-to pattern;Antalgic         Stairs Stairs: Yes Stairs assistance: Min assist Stair Management: No rails;Step to pattern;Backwards Number of Stairs: 2 General stair comments: sopouse present for instruction  Wheelchair Mobility    Modified Rankin (Stroke Patients Only)       Balance                                    Cognition Arousal/Alertness: Awake/alert                          Exercises      General Comments        Pertinent Vitals/Pain Pain Score: 2  Pain Location: L knee Pain Descriptors / Indicators: Sore Pain Intervention(s): Limited activity within patient's tolerance;Monitored during session;Patient requesting pain meds-RN notified    Home Living                      Prior Function            PT Goals (current goals can now be found in the care plan section) Progress towards PT goals: Progressing toward goals    Frequency  7X/week    PT Plan Current plan remains  appropriate    Co-evaluation             End of Session Equipment Utilized During Treatment: Other (comment) (hinged knee brace ) Activity Tolerance: Treatment limited secondary to medical complications (Comment);Patient tolerated treatment well Patient left: in chair;with call bell/phone within reach     Time: 1356-1417 PT Time Calculation (min) (ACUTE ONLY): 21 min  Charges:  $Gait Training: 8-22 mins                    G Codes:      Sabrina Hernandez 01/30/2015, 2:34 PM
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
## 2015-01-30 NOTE — Progress Notes (Signed)
Patient ID: Sabrina Hernandez, female   DOB: 27-Nov-1934, 80 y.o.   MRN: [id number] Subjective: 1 Day Post-Op Procedure(s) (LRB): OPEN REDUCTION INTERNAL (ORIF) FIXATION LEFT PATELLA (Left)    Patient reports pain as mild.  No events, no major concerns  Objective:   VITALS:   Filed Vitals:   01/30/15 0535  BP: 152/63  Pulse: 63  Temp: 98.2 F (36.8 C)  Resp: 16    Neurovascular intact Incision: dressing C/D/I  LABS  Recent Labs  01/30/15 0420  HGB 11.5*  HCT 34.3*  WBC 6.5  PLT 319     Recent Labs  01/30/15 0420  NA 135  K 4.6  BUN 12  CREATININE 0.53  GLUCOSE 127*    No results for input(s): LABPT, INR in the last 72 hours.   Assessment/Plan: 1 Day Post-Op Procedure(s) (LRB): OPEN REDUCTION INTERNAL (ORIF) FIXATION LEFT PATELLA (Left)   Advance diet Up with therapy Discharge home with home health if needed but may not as she has been dealing with this for a week now  Ordered Bledsoe brace to be worn and locked in extension for all activity outside of bed for first 3-4 weeks (reviewed rationale) RTC in 2 weeks for follow up  WBAT but no ROM exercises with left knee until further directed

## 2015-01-30 NOTE — Evaluation (Signed)
Physical Therapy Evaluation Patient Details Name: Sabrina Hernandez MRN: [id number] DOB: 1935/06/16 Today's Date: 01/30/2015   History of Present Illness  L patella fx, s/p ORIF  Clinical Impression  Pt admitted with above diagnosis. Pt currently with functional limitations due to the deficits listed below (see PT Problem List). Pt ambulated 99' with RW, distance limited by nausea with vomiting. Good progress expected once nausea resolves. Will do stair training this afternoon.  Pt will benefit from skilled PT to increase their independence and safety with mobility to allow discharge to the venue listed below.       Follow Up Recommendations Home health PT    Equipment Recommendations  Rolling walker with 5" wheels;3in1 (PT)    Recommendations for Other Services OT consult     Precautions / Restrictions Precautions Precaution Comments: no L knee flexion Required Braces or Orthoses: Knee Immobilizer - Left Knee Immobilizer - Left: On at all times Restrictions Weight Bearing Restrictions: No Other Position/Activity Restrictions: WBAT      Mobility  Bed Mobility Overal bed mobility: Modified Independent             General bed mobility comments: HOB up 30*, instructed pt to self assist LLE with RLE, used bedrail  Transfers Overall transfer level: Needs assistance Equipment used: Rolling walker (2 wheeled) Transfers: Sit to/from Stand Sit to Stand: Min guard         General transfer comment: verbal cues for hand placement  Ambulation/Gait Ambulation/Gait assistance: Min guard Ambulation Distance (Feet): 70 Feet Assistive device: Rolling walker (2 wheeled) Gait Pattern/deviations: Step-to pattern   Gait velocity interpretation: Below normal speed for age/gender General Gait Details: decr heel strike LLE, cues for sequencing, steady with RW; distance limited by nausea with vomiting  Stairs            Wheelchair Mobility    Modified Rankin (Stroke Patients  Only)       Balance Overall balance assessment: Needs assistance   Sitting balance-Leahy Scale: Good       Standing balance-Leahy Scale: Fair                               Pertinent Vitals/Pain Pain Assessment: 0-10 Pain Score: 4  Pain Location: L knee Pain Descriptors / Indicators: Sore Pain Intervention(s): Premedicated before session;Monitored during session;Limited activity within patient's tolerance;Ice applied    Home Living Family/patient expects to be discharged to:: Private residence Living Arrangements: Spouse/significant other Available Help at Discharge: Available 24 hours/day   Home Access: Stairs to enter Entrance Stairs-Rails: None Entrance Stairs-Number of Steps: 2 Home Layout: Two level;Able to live on main level with bedroom/bathroom Home Equipment: Walker - 4 wheels;Shower seat - built in      Prior Function Level of Independence: Independent with assistive device(s)         Comments: used rollator since patellar fx 01/18/15     Hand Dominance        Extremity/Trunk Assessment   Upper Extremity Assessment: Overall WFL for tasks assessed           Lower Extremity Assessment: LLE deficits/detail   LLE Deficits / Details: sensation intact to light touch, ankle strength/ROM WNL, hip -3/5, knee immobilized  Cervical / Trunk Assessment: Normal  Communication   Communication: No difficulties  Cognition Arousal/Alertness: Awake/alert Behavior During Therapy: WFL for tasks assessed/performed Overall Cognitive Status: Within Functional Limits for tasks assessed  General Comments      Exercises Total Joint Exercises Ankle Circles/Pumps: AROM;Both;10 reps;Supine      Assessment/Plan    PT Assessment Patient needs continued PT services  PT Diagnosis Difficulty walking;Acute pain   PT Problem List Decreased strength;Decreased activity tolerance;Decreased knowledge of use of DME;Decreased  mobility;Pain  PT Treatment Interventions DME instruction;Gait training;Stair training;Functional mobility training;Therapeutic activities;Patient/family education;Therapeutic exercise   PT Goals (Current goals can be found in the Care Plan section) Acute Rehab PT Goals Patient Stated Goal: likes to walk every day, golf PT Goal Formulation: With patient Time For Goal Achievement: 02/06/15 Potential to Achieve Goals: Good    Frequency 7X/week   Barriers to discharge        Co-evaluation               End of Session Equipment Utilized During Treatment: Gait belt Activity Tolerance: Treatment limited secondary to medical complications (Comment) (nausea) Patient left: in chair;with call bell/phone within reach;with family/visitor present Nurse Communication: Mobility status         Time: 8250-5397 PT Time Calculation (min) (ACUTE ONLY): 38 min   Charges:   PT Evaluation $Initial PT Evaluation Tier I: 1 Procedure PT Treatments $Gait Training: 8-22 mins $Therapeutic Activity: 8-22 mins   PT G Codes:        Philomena Doheny 01/30/2015, 10:06 AM [phone]

## 2015-01-30 NOTE — Progress Notes (Signed)
OT Cancellation Note  Patient Details Name: Sabrina Hernandez MRN: [id number] DOB: Apr 25, 1935   Cancelled Treatment:    Reason Eval/Treat Not Completed: OT screened, no needs identified, will sign off  Darlina Rumpf Mantee, OTR/L [phone]  01/30/2015, 1:04 PM

## 2015-01-30 NOTE — Progress Notes (Signed)
Pt to d/c home with [facility]. DME delivered to room prior to d/c. AVS reviewed and "My Chart" discussed with pt. Pt capable of verbalizing medications, signs and symptoms of infection, and follow-up appointments. Remains hemodynamically stable. No signs and symptoms of distress. Educated pt to return to ER in the case of SOB, dizziness, or chest pain.

## 2015-01-30 NOTE — Care Management Note (Signed)
Case Management Note  Patient Details  Name: [name] MRN: [id number] Date of Birth: 23-Jan-1935  Subjective/Objective:                   OPEN REDUCTION INTERNAL (ORIF) FIXATION LEFT PATELLA (Left) Action/Plan: Discharge planning Expected Discharge Date:  01/30/15               Expected Discharge Plan:  [place]  In-House Referral:     Discharge planning Services  CM Consult  Post Acute Care Choice:  Home Health Choice offered to:  Patient  DME Arranged:  3-N-1, Walker rolling DME Agency:  [place]:  PT [place]:  [place]  Status of Service:  Completed, signed off  Medicare Important Message Given:    Date Medicare IM Given:    Medicare IM give by:    Date Additional Medicare IM Given:    Additional Medicare Important Message give by:     If discussed at [place] of Stay Meetings, dates discussed:    Additional Comments: CM met with pt in room to offer choice of home health agency.  Pt chooses [facility] to render HHPT.  Address and contact informaiton verified by pt.  Referral called to [facility] rep, Kristen.  CM called [facility] DME rep, Lecretia to please deliver the 3n1 and rolling walker to room prior to discharge today.  No other CM needs were communicated. Dellie Catholic, RN 01/30/2015, 11:04 AM

## 2015-01-31 NOTE — Progress Notes (Signed)
CM requested HHPT orders and Face to Face for pt per PT recc; however RN, Amy states MD does not want pt to receive HHPT.  CM called pt to notify her of the change in plan and she expressed her appreciation.  No other Cm needs were communicated. [facility] aware.
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
## 2015-04-30 DIAGNOSIS — K219 Gastro-esophageal reflux disease without esophagitis: Secondary | ICD-10-CM | POA: Insufficient documentation

## 2015-04-30 DIAGNOSIS — I10 Essential (primary) hypertension: Secondary | ICD-10-CM | POA: Insufficient documentation

## 2015-06-28 DIAGNOSIS — K130 Diseases of lips: Secondary | ICD-10-CM | POA: Insufficient documentation

## 2015-10-03 DIAGNOSIS — I5022 Chronic systolic (congestive) heart failure: Secondary | ICD-10-CM | POA: Insufficient documentation

## 2015-10-30 DIAGNOSIS — R600 Localized edema: Secondary | ICD-10-CM | POA: Insufficient documentation

## 2015-10-30 DIAGNOSIS — R3129 Other microscopic hematuria: Secondary | ICD-10-CM | POA: Insufficient documentation

## 2016-05-05 DIAGNOSIS — R748 Abnormal levels of other serum enzymes: Secondary | ICD-10-CM | POA: Insufficient documentation

## 2016-11-04 DIAGNOSIS — Z6823 Body mass index (BMI) 23.0-23.9, adult: Secondary | ICD-10-CM | POA: Diagnosis not present

## 2016-11-04 DIAGNOSIS — K047 Periapical abscess without sinus: Secondary | ICD-10-CM | POA: Diagnosis not present

## 2016-11-04 DIAGNOSIS — I5022 Chronic systolic (congestive) heart failure: Secondary | ICD-10-CM | POA: Diagnosis not present

## 2016-11-04 DIAGNOSIS — K219 Gastro-esophageal reflux disease without esophagitis: Secondary | ICD-10-CM | POA: Diagnosis not present

## 2016-11-04 DIAGNOSIS — I10 Essential (primary) hypertension: Secondary | ICD-10-CM | POA: Diagnosis not present

## 2017-03-31 DIAGNOSIS — K219 Gastro-esophageal reflux disease without esophagitis: Secondary | ICD-10-CM | POA: Diagnosis not present

## 2017-03-31 DIAGNOSIS — I110 Hypertensive heart disease with heart failure: Secondary | ICD-10-CM | POA: Diagnosis not present

## 2017-03-31 DIAGNOSIS — I5022 Chronic systolic (congestive) heart failure: Secondary | ICD-10-CM | POA: Diagnosis not present

## 2017-03-31 DIAGNOSIS — I10 Essential (primary) hypertension: Secondary | ICD-10-CM | POA: Diagnosis not present

## 2017-03-31 DIAGNOSIS — R748 Abnormal levels of other serum enzymes: Secondary | ICD-10-CM | POA: Diagnosis not present

## 2017-04-08 DIAGNOSIS — H2513 Age-related nuclear cataract, bilateral: Secondary | ICD-10-CM | POA: Diagnosis not present

## 2017-04-15 DIAGNOSIS — H10413 Chronic giant papillary conjunctivitis, bilateral: Secondary | ICD-10-CM | POA: Diagnosis not present

## 2017-05-25 DIAGNOSIS — H10403 Unspecified chronic conjunctivitis, bilateral: Secondary | ICD-10-CM | POA: Diagnosis not present

## 2017-05-25 DIAGNOSIS — H01024 Squamous blepharitis left upper eyelid: Secondary | ICD-10-CM | POA: Diagnosis not present

## 2017-05-25 DIAGNOSIS — H01021 Squamous blepharitis right upper eyelid: Secondary | ICD-10-CM | POA: Diagnosis not present

## 2017-05-25 DIAGNOSIS — H01022 Squamous blepharitis right lower eyelid: Secondary | ICD-10-CM | POA: Diagnosis not present

## 2017-05-25 DIAGNOSIS — H04123 Dry eye syndrome of bilateral lacrimal glands: Secondary | ICD-10-CM | POA: Diagnosis not present

## 2017-05-25 DIAGNOSIS — H2513 Age-related nuclear cataract, bilateral: Secondary | ICD-10-CM | POA: Diagnosis not present

## 2017-05-25 DIAGNOSIS — H01025 Squamous blepharitis left lower eyelid: Secondary | ICD-10-CM | POA: Diagnosis not present

## 2017-07-08 DIAGNOSIS — H2513 Age-related nuclear cataract, bilateral: Secondary | ICD-10-CM | POA: Diagnosis not present

## 2017-07-08 DIAGNOSIS — H01022 Squamous blepharitis right lower eyelid: Secondary | ICD-10-CM | POA: Diagnosis not present

## 2017-07-08 DIAGNOSIS — H10503 Unspecified blepharoconjunctivitis, bilateral: Secondary | ICD-10-CM | POA: Diagnosis not present

## 2017-07-08 DIAGNOSIS — H04123 Dry eye syndrome of bilateral lacrimal glands: Secondary | ICD-10-CM | POA: Diagnosis not present

## 2017-07-08 DIAGNOSIS — H01024 Squamous blepharitis left upper eyelid: Secondary | ICD-10-CM | POA: Diagnosis not present

## 2017-07-08 DIAGNOSIS — H01021 Squamous blepharitis right upper eyelid: Secondary | ICD-10-CM | POA: Diagnosis not present

## 2017-07-08 DIAGNOSIS — H01025 Squamous blepharitis left lower eyelid: Secondary | ICD-10-CM | POA: Diagnosis not present

## 2017-07-31 DIAGNOSIS — R600 Localized edema: Secondary | ICD-10-CM | POA: Diagnosis not present

## 2017-07-31 DIAGNOSIS — I5022 Chronic systolic (congestive) heart failure: Secondary | ICD-10-CM | POA: Diagnosis not present

## 2017-07-31 DIAGNOSIS — R748 Abnormal levels of other serum enzymes: Secondary | ICD-10-CM | POA: Diagnosis not present

## 2017-07-31 DIAGNOSIS — I110 Hypertensive heart disease with heart failure: Secondary | ICD-10-CM | POA: Diagnosis not present

## 2017-07-31 DIAGNOSIS — K219 Gastro-esophageal reflux disease without esophagitis: Secondary | ICD-10-CM | POA: Diagnosis not present

## 2017-08-24 DIAGNOSIS — H25813 Combined forms of age-related cataract, bilateral: Secondary | ICD-10-CM | POA: Diagnosis not present

## 2017-08-24 DIAGNOSIS — H01025 Squamous blepharitis left lower eyelid: Secondary | ICD-10-CM | POA: Diagnosis not present

## 2017-08-24 DIAGNOSIS — H01021 Squamous blepharitis right upper eyelid: Secondary | ICD-10-CM | POA: Diagnosis not present

## 2017-08-24 DIAGNOSIS — H10503 Unspecified blepharoconjunctivitis, bilateral: Secondary | ICD-10-CM | POA: Diagnosis not present

## 2017-08-24 DIAGNOSIS — H01024 Squamous blepharitis left upper eyelid: Secondary | ICD-10-CM | POA: Diagnosis not present

## 2017-08-24 DIAGNOSIS — H01022 Squamous blepharitis right lower eyelid: Secondary | ICD-10-CM | POA: Diagnosis not present

## 2017-08-24 DIAGNOSIS — H04123 Dry eye syndrome of bilateral lacrimal glands: Secondary | ICD-10-CM | POA: Diagnosis not present

## 2018-01-23 DIAGNOSIS — R1030 Lower abdominal pain, unspecified: Secondary | ICD-10-CM | POA: Diagnosis not present

## 2018-01-23 DIAGNOSIS — K5730 Diverticulosis of large intestine without perforation or abscess without bleeding: Secondary | ICD-10-CM | POA: Diagnosis not present

## 2018-01-23 DIAGNOSIS — R262 Difficulty in walking, not elsewhere classified: Secondary | ICD-10-CM | POA: Diagnosis not present

## 2018-01-23 DIAGNOSIS — R102 Pelvic and perineal pain: Secondary | ICD-10-CM | POA: Diagnosis not present

## 2018-01-23 DIAGNOSIS — Z7982 Long term (current) use of aspirin: Secondary | ICD-10-CM | POA: Diagnosis not present

## 2018-01-23 DIAGNOSIS — I700 Atherosclerosis of aorta: Secondary | ICD-10-CM | POA: Diagnosis not present

## 2018-01-23 DIAGNOSIS — I509 Heart failure, unspecified: Secondary | ICD-10-CM | POA: Diagnosis not present

## 2018-01-23 DIAGNOSIS — R1084 Generalized abdominal pain: Secondary | ICD-10-CM | POA: Diagnosis not present

## 2018-01-23 DIAGNOSIS — R109 Unspecified abdominal pain: Secondary | ICD-10-CM | POA: Diagnosis not present

## 2018-01-23 DIAGNOSIS — K5900 Constipation, unspecified: Secondary | ICD-10-CM | POA: Diagnosis not present

## 2018-01-26 DIAGNOSIS — M1990 Unspecified osteoarthritis, unspecified site: Secondary | ICD-10-CM | POA: Diagnosis not present

## 2018-01-26 DIAGNOSIS — R262 Difficulty in walking, not elsewhere classified: Secondary | ICD-10-CM | POA: Diagnosis not present

## 2018-01-26 DIAGNOSIS — Z09 Encounter for follow-up examination after completed treatment for conditions other than malignant neoplasm: Secondary | ICD-10-CM | POA: Diagnosis not present

## 2018-01-26 DIAGNOSIS — R1084 Generalized abdominal pain: Secondary | ICD-10-CM | POA: Diagnosis not present

## 2018-04-06 DIAGNOSIS — H2513 Age-related nuclear cataract, bilateral: Secondary | ICD-10-CM | POA: Diagnosis not present

## 2018-04-29 DIAGNOSIS — N3001 Acute cystitis with hematuria: Secondary | ICD-10-CM | POA: Diagnosis not present

## 2018-07-02 ENCOUNTER — Emergency Department (HOSPITAL_COMMUNITY): Payer: Medicare Other

## 2018-07-02 ENCOUNTER — Other Ambulatory Visit: Payer: Self-pay

## 2018-07-02 ENCOUNTER — Emergency Department (HOSPITAL_COMMUNITY)
Admission: EM | Admit: 2018-07-02 | Discharge: 2018-07-02 | Disposition: A | Discharge status: Home / Self Care | Payer: Medicare Other | Attending: Emergency Medicine | Admitting: Emergency Medicine

## 2018-07-02 ENCOUNTER — Encounter (HOSPITAL_COMMUNITY): Payer: Self-pay

## 2018-07-02 DIAGNOSIS — Z87891 Personal history of nicotine dependence: Secondary | ICD-10-CM | POA: Insufficient documentation

## 2018-07-02 DIAGNOSIS — Z7982 Long term (current) use of aspirin: Secondary | ICD-10-CM | POA: Insufficient documentation

## 2018-07-02 DIAGNOSIS — R05 Cough: Secondary | ICD-10-CM | POA: Diagnosis not present

## 2018-07-02 DIAGNOSIS — J069 Acute upper respiratory infection, unspecified: Secondary | ICD-10-CM

## 2018-07-02 DIAGNOSIS — Z79899 Other long term (current) drug therapy: Secondary | ICD-10-CM | POA: Diagnosis not present

## 2018-07-02 DIAGNOSIS — I509 Heart failure, unspecified: Secondary | ICD-10-CM | POA: Insufficient documentation

## 2018-07-02 HISTORY — DX: Heart failure, unspecified: I50.9

## 2018-07-02 MED ORDER — IPRATROPIUM-ALBUTEROL 0.5-2.5 (3) MG/3ML IN SOLN
3.0000 mL | Freq: Once | RESPIRATORY_TRACT | Status: AC
  Administered 2018-07-02: 3 mL via RESPIRATORY_TRACT
  Filled 2018-07-02: qty 3

## 2018-07-02 MED ORDER — PREDNISONE 10 MG PO TABS: 10.0000 mg | ORAL_TABLET | Freq: Every day | ORAL | 0 refills | Status: DC

## 2018-07-02 MED ORDER — ALBUTEROL SULFATE HFA 108 (90 BASE) MCG/ACT IN AERS: 1.0000 | INHALATION_SPRAY | Freq: Four times a day (QID) | RESPIRATORY_TRACT | 1 refills | Status: DC | PRN

## 2018-07-02 NOTE — ED Notes (Signed)
Patient transported to X-ray 

## 2018-07-02 NOTE — Discharge Instructions (Addendum)
Prescription for inhaler and prednisone.  Return if worse.Chest x-ray showed no obvious pneumonia.

## 2018-07-02 NOTE — ED Triage Notes (Signed)
Patient c/o fever and a non productive cough x 7 days.

## 2018-07-03 NOTE — ED Provider Notes (Signed)
[facility] DEPT Provider Note   CSN: [id number] Arrival date & time: 07/02/18  4034     History   Chief Complaint Chief Complaint  Patient presents with  . Cough  . Fever    HPI Sabrina Hernandez is a 84 y.o. female.  Low-grade fever and nonproductive cough for 7 days.  No rusty sputum, substernal chest pain, chills.  Severity is minimal.  Patient thinks she has an upper respiratory infection.  She is ambulatory     Past Medical History:  Diagnosis Date  . CHF (congestive heart failure) ([place])   . Shortness of breath dyspnea    with exertion     Patient Active Problem List   Diagnosis Date Noted  . Left patella fracture 01/29/2015    Past Surgical History:  Procedure Laterality Date  . CERVICAL CONE BIOPSY    . HAMMER TOE SURGERY     right foot   . ORIF PATELLA Left 01/29/2015   Procedure: OPEN REDUCTION INTERNAL (ORIF) FIXATION LEFT PATELLA;  Surgeon: Paralee Cancel, MD;  Location: [facility];  Service: Orthopedics;  Laterality: Left;     OB History   No obstetric history on file.      Home Medications    Prior to Admission medications   Medication Sig Start Date End Date Taking? Authorizing Provider  aspirin 81 MG chewable tablet Chew 81 mg by mouth daily.   Yes [provider]  COD LIVER OIL PO Take 1 capsule by mouth 2 (two) times daily.   Yes [provider]  Multiple Vitamin (MULTIVITAMIN WITH MINERALS) TABS tablet Take 1 tablet by mouth every morning.   Yes [provider]  omega-3 acid ethyl esters (LOVAZA) 1 g capsule Take 1 g by mouth 2 (two) times daily.   Yes [provider]  polyvinyl alcohol (LIQUIFILM TEARS) 1.4 % ophthalmic solution Place 3 drops into both eyes as needed for dry eyes.   Yes [provider]  albuterol (PROVENTIL HFA;VENTOLIN HFA) 108 (90 Base) MCG/ACT inhaler Inhale 1-2 puffs into the lungs every 6 (six) hours as needed for wheezing or shortness of breath.  07/02/18   Nat Christen, MD  docusate sodium (COLACE) 100 MG capsule Take 1 capsule (100 mg total) by mouth 2 (two) times daily. Patient not taking: Reported on 07/02/2018 01/30/15   Danae Orleans, PA-C  ferrous sulfate 325 (65 FE) MG tablet Take 1 tablet (325 mg total) by mouth 3 (three) times daily after meals. Patient not taking: Reported on 07/02/2018 01/30/15   Danae Orleans, PA-C  HYDROcodone-acetaminophen (NORCO) 7.5-325 MG per tablet Take 1-2 tablets by mouth every 4 (four) hours as needed for moderate pain. Patient not taking: Reported on 07/02/2018 01/30/15   Danae Orleans, PA-C  polyethylene glycol (MIRALAX / GLYCOLAX) packet Take 17 g by mouth 2 (two) times daily. Patient not taking: Reported on 07/02/2018 01/30/15   Danae Orleans, PA-C  predniSONE (DELTASONE) 10 MG tablet Take 1 tablet (10 mg total) by mouth daily with breakfast. 1 tablet daily for 5 days, 1/2 tablet daily for 5 days 07/02/18   Nat Christen, MD  rivaroxaban (XARELTO) 10 MG TABS tablet Take 1 tablet (10 mg total) by mouth daily. Patient not taking: Reported on 07/02/2018 01/31/15 02/13/15  Danae Orleans, PA-C  tiZANidine (ZANAFLEX) 4 MG tablet Take 1 tablet (4 mg total) by mouth every 6 (six) hours as needed for muscle spasms. Patient not taking: Reported on 07/02/2018 01/30/15   Danae Orleans, PA-C  Family History Family History  Problem Relation Age of Onset  . Heart failure Mother     Social History Social History   Tobacco Use  . Smoking status: Former Research scientist (life sciences)  . Smokeless tobacco: Never Used  Substance Use Topics  . Alcohol use: Yes    Comment: social   . Drug use: No     Allergies   Patient has no allergy information on record.   Review of Systems Review of Systems  All other systems reviewed and are negative.    Physical Exam Updated Vital Signs BP (!) 163/93   Pulse 95   Temp 97.6 F (36.4 C) (Oral)   Resp 17   Ht 5' 0.5" (1.537 m)   Wt 49.9 kg   SpO2 97%   BMI 21.13 kg/m   Physical  Exam Vitals signs and nursing note reviewed.  Constitutional:      Appearance: She is well-developed.     Comments: nad  HENT:     Head: Normocephalic and atraumatic.  Eyes:     Conjunctiva/sclera: Conjunctivae normal.  Neck:     Musculoskeletal: Neck supple.  Cardiovascular:     Rate and Rhythm: Normal rate and regular rhythm.  Pulmonary:     Effort: Pulmonary effort is normal.     Breath sounds: Normal breath sounds.  Abdominal:     General: Bowel sounds are normal.     Palpations: Abdomen is soft.  Musculoskeletal: Normal range of motion.  Skin:    General: Skin is warm and dry.  Neurological:     Mental Status: She is alert and oriented to person, place, and time.  Psychiatric:        Behavior: Behavior normal.      ED Treatments / Results  Labs (all labs ordered are listed, but only abnormal results are displayed) Labs Reviewed - No data to display  EKG None  Radiology Dg Chest 2 View  Result Date: 07/02/2018 CLINICAL DATA:  Fever and cough EXAM: CHEST - 2 VIEW COMPARISON:  09/30/2015 FINDINGS: Chronic apical nodular densities, worse on the right which are unchanged, compatible with parenchymal scarring. Mild cardiac enlargement. No current superimposed pneumonia, collapse or consolidation. Negative for edema, effusion or pneumothorax. Trachea is midline. Degenerative changes noted of the spine. IMPRESSION: Mild cardiomegaly without current pneumonia or CHF Chronic apical pleura parenchymal scarring worse on the right with a similar nodular pattern. Atherosclerosis Electronically Signed   By: Jerilynn Mages.  Shick M.D.   On: 07/02/2018 12:58    Procedures Procedures (including critical care time)  Medications Ordered in ED Medications  ipratropium-albuterol (DUONEB) 0.5-2.5 (3) MG/3ML nebulizer solution 3 mL (3 mLs Nebulization Given 07/02/18 1217)     Initial Impression / Assessment and Plan / ED Course  I have reviewed the triage vital signs and the nursing  notes.  Pertinent labs & imaging results that were available during my care of the patient were reviewed by me and considered in my medical decision making (see chart for details).     History and physical most consistent with viral URI.  Chest x-ray shows no pneumonia or heart failure.  She responded well to a nebulizer treatment of albuterol/Atrovent.  Discharge medication albuterol inhaler and prednisone.  Final Clinical Impressions(s) / ED Diagnoses   Final diagnoses:  Upper respiratory tract infection, unspecified type    ED Discharge Orders         Ordered    albuterol (PROVENTIL HFA;VENTOLIN HFA) 108 (90 Base) MCG/ACT inhaler  Every 6  hours PRN     07/02/18 1455    predniSONE (DELTASONE) 10 MG tablet  Daily with breakfast     07/02/18 1455           Nat Christen, MD 07/03/18 2106

## 2018-07-08 ENCOUNTER — Encounter: Payer: Self-pay | Admitting: Family Medicine

## 2018-07-08 ENCOUNTER — Ambulatory Visit (INDEPENDENT_AMBULATORY_CARE_PROVIDER_SITE_OTHER): Payer: Medicare Other | Admitting: Family Medicine

## 2018-07-08 VITALS — BP 146/78 | HR 77 | Temp 98.2°F | Ht 60.5 in | Wt 114.6 lb

## 2018-07-08 DIAGNOSIS — I10 Essential (primary) hypertension: Secondary | ICD-10-CM

## 2018-07-08 DIAGNOSIS — R059 Cough, unspecified: Secondary | ICD-10-CM

## 2018-07-08 DIAGNOSIS — R05 Cough: Secondary | ICD-10-CM | POA: Diagnosis not present

## 2018-07-08 DIAGNOSIS — Z7689 Persons encountering health services in other specified circumstances: Secondary | ICD-10-CM | POA: Diagnosis not present

## 2018-07-08 DIAGNOSIS — M542 Cervicalgia: Secondary | ICD-10-CM

## 2018-07-08 DIAGNOSIS — R0982 Postnasal drip: Secondary | ICD-10-CM

## 2018-07-08 NOTE — Progress Notes (Signed)
Sabrina Hernandez is a 84 y.o. female  Chief Complaint  Patient presents with  . Establish Care    daily headaches & depression    HPI: Sabrina Hernandez is a 84 y.o. female here as a new patient to our office to establish care. Her previous PCP was Dr. Lenna Gilford with [facility] who recently retired.   She is from [place], [place] originally but has lived in [place] for 30 years. She would like to move back. She is married with 5 grown children (3 in [place] and 2 in [place]. She is a retired Psychologist, prison and probation services.  Pt complains of Rt sided neck pain on/off x years. She feels she may have some arthritis in her neck that is the underlying cause. She also thinks MSK component d/t lots of recent long distance travel. She adjusted how many pillows she sleeps on (2 down to 1) and states this has helped.  She complains of cough x 1-1.5 wks since she rode on [place] train from [place] to [place]. Cough is non-productive, worse at night. + PND, nasal congestion, lots of throat-clearing. No wheeze. Some increased SOB with minimal exertion and she notes still walking daily but with greater effort. She normally walks 3-5 miles per day.  She has been taking delysm with relief.   She has a h/o depression but is not on medication for this.  She has a dx of HTN on was on coreg in the past but is not currently taking any anti-HTN meds.   Last labs: 03/2017  Specialists: none  Med refills needed today: none   Past Medical History:  Diagnosis Date  . CHF (congestive heart failure) ([place])   . Diverticulitis   . Shortness of breath dyspnea    with exertion     Past Surgical History:  Procedure Laterality Date  . CERVICAL CONE BIOPSY    . HAMMER TOE SURGERY     right foot   . ORIF PATELLA Left 01/29/2015   Procedure: OPEN REDUCTION INTERNAL (ORIF) FIXATION LEFT PATELLA;  Surgeon: Paralee Cancel, MD;  Location: [facility];  Service: Orthopedics;  Laterality: Left;    Social History   Socioeconomic History  . Marital  status: Married    Spouse name: Not on file  . Number of children: Not on file  . Years of education: Not on file  . Highest education level: Not on file  Occupational History  . Not on file  Social Needs  . Financial resource strain: Not on file  . Food insecurity:    Worry: Not on file    Inability: Not on file  . Transportation needs:    Medical: Not on file    Non-medical: Not on file  Tobacco Use  . Smoking status: Former Research scientist (life sciences)  . Smokeless tobacco: Never Used  Substance and Sexual Activity  . Alcohol use: Yes    Comment: social   . Drug use: No  . Sexual activity: Not on file  Lifestyle  . Physical activity:    Days per week: Not on file    Minutes per session: Not on file  . Stress: Not on file  Relationships  . Social connections:    Talks on phone: Not on file    Gets together: Not on file    Attends religious service: Not on file    Active member of club or organization: Not on file    Attends meetings of clubs or organizations: Not on file    Relationship status:  Not on file  . Intimate partner violence:    Fear of current or ex partner: Not on file    Emotionally abused: Not on file    Physically abused: Not on file    Forced sexual activity: Not on file  Other Topics Concern  . Not on file  Social History Narrative   She is from [place], [place] originally but has lived in [place] for 30 years. She would like to move back. She is married with 5 grown children (3 in [place] and 2 in [place]. She is a retired Psychologist, prison and probation services.    Family History  Problem Relation Age of Onset  . Heart failure Mother   . Early death Mother   . Alcohol abuse Father   . Mental illness Father   . Stroke Father       There is no immunization history on file for this patient.  Outpatient Encounter Medications as of 07/08/2018  Medication Sig  . albuterol (PROVENTIL HFA;VENTOLIN HFA) 108 (90 Base) MCG/ACT inhaler Inhale 1-2 puffs into the lungs every 6 (six) hours as needed for  wheezing or shortness of breath.  Marland Kitchen aspirin 81 MG chewable tablet Chew 81 mg by mouth daily.  . COD LIVER OIL PO Take 1 capsule by mouth 2 (two) times daily.  . Multiple Vitamin (MULTIVITAMIN WITH MINERALS) TABS tablet Take 1 tablet by mouth every morning.  Marland Kitchen omega-3 acid ethyl esters (LOVAZA) 1 g capsule Take 1 g by mouth 2 (two) times daily.  . polyvinyl alcohol (LIQUIFILM TEARS) 1.4 % ophthalmic solution Place 3 drops into both eyes as needed for dry eyes.  . predniSONE (DELTASONE) 10 MG tablet Take 1 tablet (10 mg total) by mouth daily with breakfast. 1 tablet daily for 5 days, 1/2 tablet daily for 5 days  . [DISCONTINUED] docusate sodium (COLACE) 100 MG capsule Take 1 capsule (100 mg total) by mouth 2 (two) times daily. (Patient not taking: Reported on 07/02/2018)  . [DISCONTINUED] ferrous sulfate 325 (65 FE) MG tablet Take 1 tablet (325 mg total) by mouth 3 (three) times daily after meals. (Patient not taking: Reported on 07/02/2018)  . [DISCONTINUED] HYDROcodone-acetaminophen (NORCO) 7.5-325 MG per tablet Take 1-2 tablets by mouth every 4 (four) hours as needed for moderate pain. (Patient not taking: Reported on 07/02/2018)  . [DISCONTINUED] polyethylene glycol (MIRALAX / GLYCOLAX) packet Take 17 g by mouth 2 (two) times daily. (Patient not taking: Reported on 07/02/2018)  . [DISCONTINUED] rivaroxaban (XARELTO) 10 MG TABS tablet Take 1 tablet (10 mg total) by mouth daily. (Patient not taking: Reported on 07/02/2018)  . [DISCONTINUED] tiZANidine (ZANAFLEX) 4 MG tablet Take 1 tablet (4 mg total) by mouth every 6 (six) hours as needed for muscle spasms. (Patient not taking: Reported on 07/02/2018)   No facility-administered encounter medications on file as of 07/08/2018.      ROS: Pertinent positives and negatives noted in HPI. Remainder of ROS non-contributory    No Known Allergies  BP (!) 146/78   Pulse 77   Temp 98.2 F (36.8 C) (Oral)   Ht 5' 0.5" (1.537 m)   Wt 114 lb 9.6 oz (52 kg)   SpO2  98%   BMI 22.01 kg/m   Physical Exam  Constitutional: She appears well-developed and well-nourished. No distress.  HENT:  Head: Normocephalic and atraumatic.  Right Ear: Tympanic membrane and ear canal normal.  Left Ear: Tympanic membrane and ear canal normal.  Nose: Mucosal edema present. No rhinorrhea.  Mouth/Throat: Mucous membranes  are normal. Posterior oropharyngeal erythema present. No posterior oropharyngeal edema.  Neck: Normal range of motion. Neck supple. Muscular tenderness (Rt posterior lateral aspect of neck) present. Normal range of motion (normal ROM with pt notes pain/discomfort with sidebending to Lt and Rt rotation) present.    Cardiovascular: Normal rate and regular rhythm.  Pulmonary/Chest: Effort normal and breath sounds normal. No respiratory distress. She has no wheezes. She has no rhonchi. She has no rales.     A/P:  1. Encounter to establish care with new doctor  2. Essential hypertension - based on age, pts BP is at goal - cont low sodium diet and regular walking (pt walks 3-5 miles at least 4-5 days per week)  3. PND (post-nasal drip) 4. Cough - increase water intake - nasal saline spray TID - flonase daily and mucinex BID x 1-2 wks - f/u if symptoms worsen or do not improve in 1-2 wks  5. Musculoskeletal neck pain - heating pad at least BID followed by stretching/ROM exercises - pt notes improvement with fewer pillows at night and using neck pillow when sitting to relax to watch tv - NSAIDs (ibuprofen or naprosyn) PRN but will avoid muscle relaxants d/t possible side effects - f/u PRN  Discussed plan and reviewed medications with patient, including risks, benefits, and potential side effects. Pt expressed understand. All questions answered.  I spent 45 min with the patient today and greater than 50% was spent in counseling, coordination of care, education

## 2018-07-08 NOTE — Patient Instructions (Addendum)
Drink plenty of fluids, especially water Use nasal saline spray at least 3 times per day Try Mucinex 1 tab twice per day or you can take 1 tab of claritin or zyrtec daily Try flonase 2 sprays each nostril daily Follow-up if symptoms worsen or do not improve in 7-10 days  Heating pad  Exercises  Neck Exercises Neck exercises can be important for many reasons:  They can help you to improve and maintain flexibility in your neck. This can be especially important as you age.  They can help to make your neck stronger. This can make movement easier.  They can reduce or prevent neck pain.  They may help your upper back. Ask your health care provider which neck exercises would be best for you. Exercises to improve neck flexibility Neck stretch Repeat this exercise 3-5 times. 1. Do this exercise while standing or while sitting in a chair. 2. Place your feet flat on the floor, shoulder-width apart. 3. Slowly turn your head to the right. Turn it all the way to the right so you can look over your right shoulder. Do not tilt or tip your head. 4. Hold this position for 10-30 seconds. 5. Slowly turn your head to the left, to look over your left shoulder. 6. Hold this position for 10-30 seconds.  Neck retraction Repeat this exercise 8-10 times. Do this 3-4 times a day or as told by your health care provider. 1. Do this exercise while standing or while sitting in a sturdy chair. 2. Look straight ahead. Do not bend your neck. 3. Use your fingers to push your chin backward. Do not bend your neck for this movement. Continue to face straight ahead. If you are doing the exercise properly, you will feel a slight sensation in your throat and a stretch at the back of your neck. 4. Hold the stretch for 1-2 seconds. Relax and repeat. Exercises to improve neck strength Neck press Repeat this exercise 10 times. Do it first thing in the morning and right before bed or as told by your health care  provider. 1. Lie on your back on a firm bed or on the floor with a pillow under your head. 2. Use your neck muscles to push your head down on the pillow and straighten your spine. 3. Hold the position as well as you can. Keep your head facing up and your chin tucked. 4. Slowly count to 5 while holding this position. 5. Relax for a few seconds. Then repeat. Isometric strengthening Do a full set of these exercises 2 times a day or as told by your health care provider. 1. Sit in a supportive chair and place your hand on your forehead. 2. Push forward with your head and neck while pushing back with your hand. Hold for 10 seconds. 3. Relax. Then repeat the exercise 3 times. 4. Next, do thesequence again, this time putting your hand against the back of your head. Use your head and neck to push backward against the hand pressure. 5. Finally, do the same exercise on either side of your head, pushing sideways against the pressure of your hand. Prone head lifts Repeat this exercise 5 times. Do this 2 times a day or as told by your health care provider. 1. Lie face-down, resting on your elbows so that your chest and upper back are raised. 2. Start with your head facing downward, near your chest. Position your chin either on or near your chest. 3. Slowly lift your head upward. Lift  until you are looking straight ahead. Then continue lifting your head as far back as you can stretch. 4. Hold your head up for 5 seconds. Then slowly lower it to your starting position. Supine head lifts Repeat this exercise 8-10 times. Do this 2 times a day or as told by your health care provider. 1. Lie on your back, bending your knees to point to the ceiling and keeping your feet flat on the floor. 2. Lift your head slowly off the floor, raising your chin toward your chest. 3. Hold for 5 seconds. 4. Relax and repeat. Scapular retraction Repeat this exercise 5 times. Do this 2 times a day or as told by your health care  provider. 1. Stand with your arms at your sides. Look straight ahead. 2. Slowly pull both shoulders backward and downward until you feel a stretch between your shoulder blades in your upper back. 3. Hold for 10-30 seconds. 4. Relax and repeat. Contact a health care provider if:  Your neck pain or discomfort gets much worse when you do an exercise.  Your neck pain or discomfort does not improve within 2 hours after you exercise. If you have any of these problems, stop exercising right away. Do not do the exercises again unless your health care provider says that you can. Get help right away if:  You develop sudden, severe neck pain. If this happens, stop exercising right away. Do not do the exercises again unless your health care provider says that you can. This information is not intended to replace advice given to you by your health care provider. Make sure you discuss any questions you have with your health care provider. Document Released: 05/28/2015 Document Revised: 10/20/2017 Document Reviewed: 12/25/2014 Elsevier Interactive Patient Education  2019 [employer].

## 2018-08-04 ENCOUNTER — Ambulatory Visit (INDEPENDENT_AMBULATORY_CARE_PROVIDER_SITE_OTHER): Payer: Medicare Other

## 2018-08-04 ENCOUNTER — Encounter: Payer: Self-pay | Admitting: Family Medicine

## 2018-08-04 ENCOUNTER — Ambulatory Visit: Payer: Medicare Other | Admitting: Family Medicine

## 2018-08-04 ENCOUNTER — Ambulatory Visit (INDEPENDENT_AMBULATORY_CARE_PROVIDER_SITE_OTHER): Payer: Medicare Other | Admitting: Family Medicine

## 2018-08-04 VITALS — BP 110/60 | HR 100 | Temp 98.0°F | Ht 60.5 in | Wt 112.4 lb

## 2018-08-04 DIAGNOSIS — J45909 Unspecified asthma, uncomplicated: Secondary | ICD-10-CM | POA: Insufficient documentation

## 2018-08-04 DIAGNOSIS — R05 Cough: Secondary | ICD-10-CM | POA: Diagnosis not present

## 2018-08-04 DIAGNOSIS — J4541 Moderate persistent asthma with (acute) exacerbation: Secondary | ICD-10-CM

## 2018-08-04 DIAGNOSIS — R059 Cough, unspecified: Secondary | ICD-10-CM

## 2018-08-04 DIAGNOSIS — Z8679 Personal history of other diseases of the circulatory system: Secondary | ICD-10-CM | POA: Insufficient documentation

## 2018-08-04 MED ORDER — BENZONATATE 200 MG PO CAPS: 200.0000 mg | ORAL_CAPSULE | Freq: Two times a day (BID) | ORAL | 0 refills | Status: DC | PRN

## 2018-08-04 MED ORDER — PREDNISONE 10 MG (21) PO TBPK: ORAL_TABLET | ORAL | 0 refills | Status: DC

## 2018-08-04 MED ORDER — METHYLPREDNISOLONE SODIUM SUCC 125 MG IJ SOLR
125.0000 mg | Freq: Once | INTRAMUSCULAR | Status: AC
  Administered 2018-08-04: 125 mg via INTRAMUSCULAR

## 2018-08-04 NOTE — Patient Instructions (Signed)
Asthma, Adult  Asthma is a long-term (chronic) condition that causes recurrent episodes in which the airways become tight and narrow. The airways are the passages that lead from the nose and mouth down into the lungs. Asthma episodes, also called asthma attacks, can cause coughing, wheezing, shortness of breath, and chest pain. The airways can also fill with mucus. During an attack, it can be difficult to breathe. Asthma attacks can range from minor to life threatening. Asthma cannot be cured, but medicines and lifestyle changes can help control it and treat acute attacks. What are the causes? This condition is believed to be caused by inherited (genetic) and environmental factors, but its exact cause is not known. There are many things that can bring on an asthma attack or make asthma symptoms worse (triggers). Asthma triggers are different for each person. Common triggers include:  Mold.  Dust.  Cigarette smoke.  Cockroaches.  Things that can cause allergy symptoms (allergens), such as animal dander or pollen from trees or grass.  Air pollutants such as household cleaners, wood smoke, smog, or Advertising account planner.  Cold air, weather changes, and winds (which increase molds and pollen in the air).  Strong emotional expressions such as crying or laughing hard.  Stress.  Certain medicines (such as aspirin) or types of medicines (such as beta-blockers).  Sulfites in foods and drinks. Foods and drinks that may contain sulfites include dried fruit, potato chips, and sparkling grape juice.  Infections or inflammatory conditions such as the flu, a cold, or inflammation of the nasal membranes (rhinitis).  Gastroesophageal reflux disease (GERD).  Exercise or strenuous activity. What are the signs or symptoms? Symptoms of this condition may occur right after asthma is triggered or many hours later. Symptoms include:  Wheezing. This can sound like whistling when you breathe.  Excessive  nighttime or early morning coughing.  Frequent or severe coughing with a common cold.  Chest tightness.  Shortness of breath.  Tiredness (fatigue) with minimal activity. How is this diagnosed? This condition is diagnosed based on:  Your medical history.  A physical exam.  Tests, which may include: ? Lung function studies and pulmonary studies (spirometry). These tests can evaluate the flow of air in your lungs. ? Allergy tests. ? Imaging tests, such as X-rays. How is this treated? There is no cure for this condition, but treatment can help control your symptoms. Treatment for asthma usually involves:  Identifying and avoiding your asthma triggers.  Using medicines to control your symptoms. Generally, two types of medicines are used to treat asthma: ? Controller medicines. These help prevent asthma symptoms from occurring. They are usually taken every day. ? Fast-acting reliever or rescue medicines. These quickly relieve asthma symptoms by widening the narrow and tight airways. They are used as needed and provide short-term relief.  Using supplemental oxygen. This may be needed during a severe episode.  Using other medicines, such as: ? Allergy medicines, such as antihistamines, if your asthma attacks are triggered by allergens. ? Immune medicines (immunomodulators). These are medicines that help control the immune system.  Creating an asthma action plan. An asthma action plan is a written plan for managing and treating your asthma attacks. This plan includes: ? A list of your asthma triggers and how to avoid them. ? Information about when medicines should be taken and when their dosage should be changed. ? Instructions about using a device called a peak flow meter. A peak flow meter measures how well the lungs are working and the  severity of your asthma. It helps you monitor your condition. Follow these instructions at home: Controlling your home environment Control your home  environment in the following ways to help avoid triggers and prevent asthma attacks:  Change your heating and air conditioning filter regularly.  Limit your use of fireplaces and wood stoves.  Get rid of pests (such as roaches and mice) and their droppings.  Throw away plants if you see mold on them.  Clean floors and dust surfaces regularly. Use unscented cleaning products.  Try to have someone else vacuum for you regularly. Stay out of rooms while they are being vacuumed and for a short while afterward. If you vacuum, use a dust mask from a hardware store, a double-layered or microfilter vacuum cleaner bag, or a vacuum cleaner with a HEPA filter.  Replace carpet with wood, tile, or vinyl flooring. Carpet can trap dander and dust.  Use allergy-proof pillows, mattress covers, and box spring covers.  Keep your bedroom a trigger-free room.  Avoid pets and keep windows closed when allergens are in the air.  Wash beddings every week in hot water and dry them in a dryer.  Use blankets that are made of polyester or cotton.  Clean bathrooms and kitchens with bleach. If possible, have someone repaint the walls in these rooms with mold-resistant paint. Stay out of the rooms that are being cleaned and painted.  Wash your hands often with soap and water. If soap and water are not available, use hand sanitizer.  Do not allow anyone to smoke in your home. General instructions  Take over-the-counter and prescription medicines only as told by your health care provider. ? Speak with your health care provider if you have questions about how or when to take the medicines. ? Make note if you are requiring more frequent dosages.  Do not use any products that contain nicotine or tobacco, such as cigarettes and e-cigarettes. If you need help quitting, ask your health care provider. Also, avoid being exposed to secondhand smoke.  Use a peak flow meter as told by your health care provider. Record and  keep track of the readings.  Understand and use the asthma action plan to help minimize, or stop an asthma attack, without needing to seek medical care.  Make sure you stay up to date on your yearly vaccinations as told by your health care provider. This may include vaccines for the flu and pneumonia.  Avoid outdoor activities when allergen counts are high and when air quality is low.  Wear a ski mask that covers your nose and mouth during outdoor winter activities. Exercise indoors on cold days if you can.  Warm up before exercising, and take time for a cool-down period after exercise.  Keep all follow-up visits as told by your health care provider. This is important. Where to find more information  For information about asthma, turn to the Centers for Disease Control and Prevention at www.cdc.gov/asthma/faqs.htm  For air quality information, turn to AirNow at https://airnow.gov/ Contact a health care provider if:  You have wheezing, shortness of breath, or a cough even while you are taking medicine to prevent attacks.  The mucus you cough up (sputum) is thicker than usual.  Your sputum changes from clear or white to yellow, green, gray, or bloody.  Your medicines are causing side effects, such as a rash, itching, swelling, or trouble breathing.  You need to use a reliever medicine more than 2-3 times a week.  Your peak flow reading   is still at 50-79% of your personal best after following your action plan for 1 hour.  You have a fever. Get help right away if:  You are getting worse and do not respond to treatment during an asthma attack.  You are short of breath when at rest or when doing very little physical activity.  You have difficulty eating, drinking, or talking.  You have chest pain or tightness.  You develop a fast heartbeat or palpitations.  You have a bluish color to your lips or fingernails.  You are light-headed or dizzy, or you faint.  Your peak flow  reading is less than 50% of your personal best.  You feel too tired to breathe normally. Summary  Asthma is a long-term (chronic) condition that causes recurrent episodes in which the airways become tight and narrow. These episodes can cause coughing, wheezing, shortness of breath, and chest pain.  Asthma cannot be cured, but medicines and lifestyle changes can help control it and treat acute attacks.  Make sure you understand how to avoid triggers and how and when to use your medicines.  Asthma attacks can range from minor to life threatening. Get help right away if you have an asthma attack and do not respond to treatment with your usual rescue medicines. This information is not intended to replace advice given to you by your health care provider. Make sure you discuss any questions you have with your health care provider. Document Released: 06/16/2005 Document Revised: 07/21/2016 Document Reviewed: 07/21/2016 Elsevier Interactive Patient Education  2019 [employer]  Bronchospasm, Adult  Bronchospasm is a tightening of the airways going into the lungs. During an episode, it may be harder to breathe. You may cough, and you may make a whistling sound when you breathe (wheeze). This condition often affects people with asthma. What are the causes? This condition is caused by swelling and irritation in the airways. It can be triggered by:  An infection (common).  Seasonal allergies.  An allergic reaction.  Exercise.  Irritants. These include pollution, cigarette smoke, strong odors, aerosol sprays, and paint fumes.  Weather changes. Winds increase molds and pollens in the air. Cold air may cause swelling.  Stress and emotional upset. What are the signs or symptoms? Symptoms of this condition include:  Wheezing. If the episode was triggered by an allergy, wheezing may start right away or hours later.  Nighttime coughing.  Frequent or severe coughing with a simple  cold.  Chest tightness.  Shortness of breath.  Decreased ability to exercise. How is this diagnosed? This condition is usually diagnosed with a review of your medical history and a physical exam. Tests, such as lung function tests, are sometimes done to look for other conditions. The need for a chest X-ray depends on where the wheezing occurs and whether it is the first time you have wheezed. How is this treated? This condition may be treated with:  Inhaled medicines. These open up the airways and help you breathe. They can be taken with an inhaler or a nebulizer device.  Corticosteroid medicines. These may be given for severe bronchospasm, usually when it is associated with asthma.  Avoiding triggers, such as irritants, infection, or allergies. Follow these instructions at home: Medicines  Take over-the-counter and prescription medicines only as told by your health care provider.  If you need to use an inhaler or nebulizer to take your medicine, ask your health care provider to explain how to use it correctly. If you were given a spacer,  always use it with your inhaler. Lifestyle  Reduce the number of triggers in your home. To do this: ? Change your heating and air conditioning filter at least once a month. ? Limit your use of fireplaces and wood stoves. ? Do not smoke. Do not allow smoking in your home. ? Avoid using perfumes and fragrances. ? Get rid of pests, such as roaches and mice, and their droppings. ? Remove any mold from your home. ? Keep your house clean and dust free. Use unscented cleaning products. ? Replace carpet with wood, tile, or vinyl flooring. Carpet can trap dander and dust. ? Use allergy-proof pillows, mattress covers, and box spring covers. ? Wash bed sheets and blankets every week in hot water. Dry them in a dryer. ? Use blankets that are made of polyester or cotton. ? Wash your hands often. ? Do not allow pets in your bedroom.  Avoid breathing in  cold air when you exercise. General instructions  Have a plan for seeking medical care. Know when to call your health care provider and local emergency services, and where to get emergency care.  Stay up to date on your immunizations.  When you have an episode of bronchospasm, stay calm. Try to relax and breathe more slowly.  If you have asthma, make sure you have an asthma action plan.  Keep all follow-up visits as told by your health care provider. This is important. Contact a health care provider if:  You have muscle aches.  You have chest pain.  The mucus that you cough up (sputum) changes from clear or white to yellow, green, gray, or bloody.  You have a fever.  Your sputum gets thicker. Get help right away if:  Your wheezing and coughing get worse, even after you take your prescribed medicines.  It gets even harder to breathe.  You develop severe chest pain. Summary  Bronchospasm is a tightening of the airways going into the lungs.  During an episode of bronchospasm, you may have a harder time breathing. You may cough and make a whistling sound when you breathe (wheeze).  Avoid exposure to triggers such as smoke, dust, mold, animal dander, and fragrances.  When you have an episode of bronchospasm, stay calm. Try to relax and breathe more slowly. This information is not intended to replace advice given to you by your health care provider. Make sure you discuss any questions you have with your health care provider. Document Released: 06/19/2003 Document Revised: 06/12/2016 Document Reviewed: 06/12/2016 Elsevier Interactive Patient Education  2019 [employer].

## 2018-08-04 NOTE — Progress Notes (Signed)
Established Patient Office Visit  Subjective:  Patient ID: Sabrina Hernandez, female    DOB: Apr 24, 1935  Age: 84 y.o. MRN: [id number]  CC:  Chief Complaint  Patient presents with  . URI    HPI Sabrina Hernandez presents for evaluation and treatment of a cough that is persisted since the end of December.  Seen in the ER on 3 January and treated with prednisone and albuterol inhaler.  Chest x-ray at that time showed mild cardiomegaly without pneumonia or CHF.  Patient has no history of asthma.  She has been tight and wheezy in her chest.  Cough is been nonproductive.  There is been no fever or chills.  Seen approximately a month ago and treated with Flonase for her postnasal drip.  Postnasal drip has improved with the Flonase treatment.  Past Medical History:  Diagnosis Date  . CHF (congestive heart failure) ([place])   . Diverticulitis   . Shortness of breath dyspnea    with exertion     Past Surgical History:  Procedure Laterality Date  . CERVICAL CONE BIOPSY    . HAMMER TOE SURGERY     right foot   . ORIF PATELLA Left 01/29/2015   Procedure: OPEN REDUCTION INTERNAL (ORIF) FIXATION LEFT PATELLA;  Surgeon: Paralee Cancel, MD;  Location: [facility];  Service: Orthopedics;  Laterality: Left;    Family History  Problem Relation Age of Onset  . Heart failure Mother   . Early death Mother   . Alcohol abuse Father   . Mental illness Father   . Stroke Father     Social History   Socioeconomic History  . Marital status: Married    Spouse name: Not on file  . Number of children: Not on file  . Years of education: Not on file  . Highest education level: Not on file  Occupational History  . Not on file  Social Needs  . Financial resource strain: Not on file  . Food insecurity:    Worry: Not on file    Inability: Not on file  . Transportation needs:    Medical: Not on file    Non-medical: Not on file  Tobacco Use  . Smoking status: Former Research scientist (life sciences)  . Smokeless tobacco: Never Used    Substance and Sexual Activity  . Alcohol use: Yes    Comment: social   . Drug use: No  . Sexual activity: Not on file  Lifestyle  . Physical activity:    Days per week: Not on file    Minutes per session: Not on file  . Stress: Not on file  Relationships  . Social connections:    Talks on phone: Not on file    Gets together: Not on file    Attends religious service: Not on file    Active member of club or organization: Not on file    Attends meetings of clubs or organizations: Not on file    Relationship status: Not on file  . Intimate partner violence:    Fear of current or ex partner: Not on file    Emotionally abused: Not on file    Physically abused: Not on file    Forced sexual activity: Not on file  Other Topics Concern  . Not on file  Social History Narrative   She is from [place], [place] originally but has lived in [place] for 30 years. She would like to move back. She is married with 5 grown children (3 in [place] and 2  in [place]. She is a retired Psychologist, prison and probation services.    Outpatient Medications Prior to Visit  Medication Sig Dispense Refill  . aspirin 81 MG chewable tablet Chew 81 mg by mouth daily.    . COD LIVER OIL PO Take 1 capsule by mouth 2 (two) times daily.    . Multiple Vitamin (MULTIVITAMIN WITH MINERALS) TABS tablet Take 1 tablet by mouth every morning.    Marland Kitchen omega-3 acid ethyl esters (LOVAZA) 1 g capsule Take 1 g by mouth 2 (two) times daily.    . polyvinyl alcohol (LIQUIFILM TEARS) 1.4 % ophthalmic solution Place 3 drops into both eyes as needed for dry eyes.    Marland Kitchen albuterol (PROVENTIL HFA;VENTOLIN HFA) 108 (90 Base) MCG/ACT inhaler Inhale 1-2 puffs into the lungs every 6 (six) hours as needed for wheezing or shortness of breath. 1 Inhaler 1  . predniSONE (DELTASONE) 10 MG tablet Take 1 tablet (10 mg total) by mouth daily with breakfast. 1 tablet daily for 5 days, 1/2 tablet daily for 5 days 8 tablet 0   No facility-administered medications prior to visit.     No  Known Allergies  ROS Review of Systems  Constitutional: Negative for chills, diaphoresis, fatigue, fever and unexpected weight change.  HENT: Positive for postnasal drip. Negative for rhinorrhea, sinus pressure and sinus pain.   Eyes: Negative.   Respiratory: Positive for cough and wheezing.   Cardiovascular: Negative.  Negative for chest pain.  Gastrointestinal: Negative.   Endocrine: Negative for polyphagia and polyuria.  Genitourinary: Negative.   Musculoskeletal: Negative for arthralgias and myalgias.  Skin: Negative.   Allergic/Immunologic: Negative for immunocompromised state.  Neurological: Negative for light-headedness and numbness.  Hematological: Does not bruise/bleed easily.  Psychiatric/Behavioral: Negative.       Objective:    Physical Exam  Constitutional: She is oriented to person, place, and time. She appears well-developed and well-nourished. No distress.  HENT:  Head: Normocephalic and atraumatic.  Right Ear: External ear normal.  Left Ear: External ear normal.  Mouth/Throat: Oropharynx is clear and moist. No oropharyngeal exudate.  Eyes: Pupils are equal, round, and reactive to light. Conjunctivae are normal. Right eye exhibits no discharge. Left eye exhibits no discharge. No scleral icterus.  Neck: Neck supple. No JVD present. No tracheal deviation present. No thyromegaly present.  Cardiovascular: Normal rate, regular rhythm and normal heart sounds.  Pulmonary/Chest: Effort normal and breath sounds normal. No stridor. No respiratory distress. She has no wheezes. She has no rales.  Lymphadenopathy:    She has no cervical adenopathy.  Neurological: She is alert and oriented to person, place, and time.  Skin: Skin is warm and dry. She is not diaphoretic.  Psychiatric: She has a normal mood and affect. Her behavior is normal.    BP 110/60   Pulse 100   Temp 98 F (36.7 C) (Oral)   Ht 5' 0.5" (1.537 m)   Wt 112 lb 6 oz (51 kg)   SpO2 93%   BMI 21.59  kg/m  Wt Readings from Last 3 Encounters:  08/04/18 112 lb 6 oz (51 kg)  07/08/18 114 lb 9.6 oz (52 kg)  07/02/18 110 lb (49.9 kg)   BP Readings from Last 3 Encounters:  08/04/18 110/60  07/08/18 (!) 146/78  07/02/18 (!) 163/93   Guideline developer:  UpToDate (see UpToDate for funding source) Date Released: June 2014  Health Maintenance Due  Topic Date Due  . Samul Dada  03/02/1956  . DEXA SCAN  03/02/2002  . PNA vac  Low Risk Adult (1 of 2 - PCV13) 03/02/2002    There are no preventive care reminders to display for this patient.  No results found for: TSH Lab Results  Component Value Date   WBC 6.5 01/30/2015   HGB 11.5 (L) 01/30/2015   HCT 34.3 (L) 01/30/2015   MCV 92.7 01/30/2015   PLT 319 01/30/2015   Lab Results  Component Value Date   NA 135 01/30/2015   K 4.6 01/30/2015   CO2 25 01/30/2015   GLUCOSE 127 (H) 01/30/2015   BUN 12 01/30/2015   CREATININE 0.53 01/30/2015   BILITOT 3.4 (H) 07/22/2008   ALKPHOS 65 07/22/2008   AST 30 07/22/2008   ALT 28 07/22/2008   PROT 7.0 07/22/2008   ALBUMIN 3.8 07/22/2008   CALCIUM 9.0 01/30/2015   ANIONGAP 8 01/30/2015   No results found for: CHOL No results found for: HDL No results found for: LDLCALC No results found for: TRIG No results found for: CHOLHDL No results found for: HGBA1C    Assessment & Plan:   Problem List Items Addressed This Visit      Respiratory   Reactive airway disease - Primary   Relevant Medications   methylPREDNISolone sodium succinate (SOLU-MEDROL) 125 mg/2 mL injection 125 mg (Completed)   benzonatate (TESSALON) 200 MG capsule   predniSONE (STERAPRED UNI-PAK 21 TAB) 10 MG (21) TBPK tablet   Other Relevant Orders   DG Chest 2 View   CBC     Other   Cough   Relevant Medications   benzonatate (TESSALON) 200 MG capsule   Other Relevant Orders   DG Chest 2 View   CBC      Meds ordered this encounter  Medications  . methylPREDNISolone sodium succinate (SOLU-MEDROL) 125  mg/2 mL injection 125 mg  . benzonatate (TESSALON) 200 MG capsule    Sig: Take 1 capsule (200 mg total) by mouth 2 (two) times daily as needed for cough.    Dispense:  20 capsule    Refill:  0  . predniSONE (STERAPRED UNI-PAK 21 TAB) 10 MG (21) TBPK tablet    Sig: Take 6 today, 5 tomorrow, 4 the next day and then 3, 2, 1 and stop    Dispense:  21 tablet    Refill:  0    Follow-up: Return in about 1 week (around 08/11/2018), or if symptoms worsen or fail to improve.

## 2018-09-28 ENCOUNTER — Telehealth: Payer: Self-pay | Admitting: Family Medicine

## 2018-09-28 NOTE — Telephone Encounter (Signed)
rec'd voice message from pt. on COVID 19 Question line.  Left voice message that her symptoms of COVID have resolved.  Questioned if a blood test can be done to check for presence of antibodies, following the Coronavirus.  Reported that her husband's symptoms have also resolved, and wanted to ask about blood test for him, as well.      Attempted to call pt.  Pt. Answered, but was not able to hear nurse speaking; call was disconnected.  Attempted to call pt. back, and left voice message that her question was rec'd., and will direct her question to Dr. Bryan Lemma.

## 2018-09-28 NOTE — Telephone Encounter (Signed)
Dr.C please advise

## 2018-09-29 NOTE — Telephone Encounter (Signed)
Thank you for the information. I would agree with recommendation that the patient contact the Health Dept with questions and for further direction. As far as I know, [facility] is not currently testing pts with presumed cases for immunity/antibody status.

## 2018-09-29 NOTE — Telephone Encounter (Addendum)
Pt. returned call.  Advised of the response from Dr. Bryan Lemma re: no testing being done to check for antibodies to any strain of the Coronavirus.  The patient wanted to make Dr. Bryan Lemma aware that they took a trip to [facility], 12/26, and became ill on 12/28.  Stated that she and husband were both ill for the months of January and February.  Also, reported her husband had an eye infection during that time, and was advised by eye doctor, that the eye infection could have been caused by Coronavirus.  Pt. Stated that the eye doctor also recommended they should be tested to check for antibodies that may have developed, as result of COVID 19.  Offered pt. the phone number to the Health Department, for further inquiry on this matter.  Pt. appreciated the information.  Will send note to PCP for her awareness of the above information.

## 2018-09-29 NOTE — Telephone Encounter (Signed)
I was not aware pt tested positive for coronavirus and do not see that documentation anywhere in her chart. However, I'm glad she is feeling better. Unfortunately, at this time, we are not testing for antibodies to any strain of the coronavirus. Please encourage her to stay home and practice good hand washing, etc in order to remain healthy.

## 2018-09-29 NOTE — Telephone Encounter (Signed)
Left pt VM to call back to inform her of Dr. Loletha Grayer advise. Okay for [facility] to relay message.

## 2018-09-29 NOTE — Telephone Encounter (Signed)
Dr. Murray Hodgkins

## 2018-10-13 DIAGNOSIS — D040 Carcinoma in situ of skin of lip: Secondary | ICD-10-CM | POA: Diagnosis not present

## 2018-10-13 DIAGNOSIS — L570 Actinic keratosis: Secondary | ICD-10-CM | POA: Diagnosis not present

## 2018-10-13 DIAGNOSIS — Z85828 Personal history of other malignant neoplasm of skin: Secondary | ICD-10-CM | POA: Diagnosis not present

## 2018-11-01 DIAGNOSIS — Z85828 Personal history of other malignant neoplasm of skin: Secondary | ICD-10-CM | POA: Diagnosis not present

## 2018-11-01 DIAGNOSIS — C4402 Squamous cell carcinoma of skin of lip: Secondary | ICD-10-CM | POA: Diagnosis not present

## 2018-11-11 DIAGNOSIS — H2513 Age-related nuclear cataract, bilateral: Secondary | ICD-10-CM | POA: Diagnosis not present

## 2018-12-14 DIAGNOSIS — H10023 Other mucopurulent conjunctivitis, bilateral: Secondary | ICD-10-CM | POA: Diagnosis not present

## 2019-05-11 DIAGNOSIS — L821 Other seborrheic keratosis: Secondary | ICD-10-CM | POA: Diagnosis not present

## 2019-05-11 DIAGNOSIS — L570 Actinic keratosis: Secondary | ICD-10-CM | POA: Diagnosis not present

## 2019-05-11 DIAGNOSIS — L814 Other melanin hyperpigmentation: Secondary | ICD-10-CM | POA: Diagnosis not present

## 2019-05-11 DIAGNOSIS — Z85828 Personal history of other malignant neoplasm of skin: Secondary | ICD-10-CM | POA: Diagnosis not present

## 2019-08-08 ENCOUNTER — Telehealth: Payer: Self-pay | Admitting: Family Medicine

## 2019-08-08 NOTE — Telephone Encounter (Signed)
Pt called because she is scheduled for covid shot and was told she needed to ok it with her doctor because she is on blood thinner

## 2019-08-10 NOTE — Telephone Encounter (Signed)
Pt informed and verbalized understanding.  No further documentation needed.

## 2019-08-10 NOTE — Telephone Encounter (Signed)
Pt is ok to receive covid vaccine. Does she need documentation from me? If so, Brendell can you please write a note saying pt is able to receive covid vaccine? Thanks

## 2019-08-14 ENCOUNTER — Ambulatory Visit: Payer: Medicare Other

## 2019-10-25 DIAGNOSIS — M545 Low back pain: Secondary | ICD-10-CM | POA: Diagnosis not present

## 2019-11-03 DIAGNOSIS — M1712 Unilateral primary osteoarthritis, left knee: Secondary | ICD-10-CM | POA: Diagnosis not present

## 2019-11-03 DIAGNOSIS — M545 Low back pain: Secondary | ICD-10-CM | POA: Diagnosis not present

## 2019-11-03 DIAGNOSIS — M5416 Radiculopathy, lumbar region: Secondary | ICD-10-CM | POA: Diagnosis not present

## 2019-11-03 DIAGNOSIS — M25562 Pain in left knee: Secondary | ICD-10-CM | POA: Diagnosis not present

## 2019-12-12 ENCOUNTER — Telehealth: Payer: Self-pay | Admitting: Family Medicine

## 2019-12-12 NOTE — Telephone Encounter (Signed)
Pt called and wanted to know the number for a podiatrist for [facility], I gave her [phone] which is for [facility]([place]). Pt said she will give them a call

## 2019-12-29 ENCOUNTER — Encounter: Payer: Self-pay | Admitting: Podiatry

## 2019-12-29 ENCOUNTER — Other Ambulatory Visit: Payer: Self-pay

## 2019-12-29 ENCOUNTER — Ambulatory Visit (INDEPENDENT_AMBULATORY_CARE_PROVIDER_SITE_OTHER): Payer: Medicare Other | Admitting: Podiatry

## 2019-12-29 VITALS — BP 150/76 | HR 62 | Resp 16

## 2019-12-29 DIAGNOSIS — L600 Ingrowing nail: Secondary | ICD-10-CM

## 2019-12-29 DIAGNOSIS — S90221A Contusion of right lesser toe(s) with damage to nail, initial encounter: Secondary | ICD-10-CM

## 2019-12-29 MED ORDER — NEOMYCIN-POLYMYXIN-HC 1 % OT SOLN: OTIC | 1 refills | Status: DC

## 2019-12-29 NOTE — Patient Instructions (Signed)

## 2019-12-29 NOTE — Progress Notes (Signed)
  Subjective:  Patient ID: Sabrina Hernandez, female    DOB: [birth year],  MRN: [id number] HPI Chief Complaint  Patient presents with  . Toe Pain    Hallux right - injury to toe 2 months ago, bruised and sore, no treatment  . New Patient (Initial Visit)    85 y.o. female presents with the above complaint.   ROS: Denies fever chills nausea vomiting muscle aches pains calf pain back pain chest pain shortness of breath.  Past Medical History:  Diagnosis Date  . CHF (congestive heart failure) ([place])   . Diverticulitis   . Shortness of breath dyspnea    with exertion    Past Surgical History:  Procedure Laterality Date  . CERVICAL CONE BIOPSY    . HAMMER TOE SURGERY     right foot   . ORIF PATELLA Left 01/29/2015   Procedure: OPEN REDUCTION INTERNAL (ORIF) FIXATION LEFT PATELLA;  Surgeon: Paralee Cancel, MD;  Location: [facility];  Service: Orthopedics;  Laterality: Left;    Current Outpatient Medications:  .  aspirin 81 MG chewable tablet, Chew 81 mg by mouth daily., Disp: , Rfl:  .  Multiple Vitamin (MULTIVITAMIN WITH MINERALS) TABS tablet, Take 1 tablet by mouth every morning., Disp: , Rfl:  .  NEOMYCIN-POLYMYXIN-HYDROCORTISONE (CORTISPORIN) 1 % SOLN OTIC solution, Apply 1-2 drops to toe BID after soaking, Disp: 10 mL, Rfl: 1  No Known Allergies Review of Systems Objective:   Vitals:   12/29/19 0829  BP: (!) 150/76  Pulse: 62  Resp: 16    General: Well developed, nourished, in no acute distress, alert and oriented x3   Dermatological: Skin is warm, dry and supple bilateral. Nails x 10 are well maintained; remaining integument appears unremarkable at this time. There are no open sores, no preulcerative lesions, no rash or signs of infection present.  Sharp incurvated nail margin along the tibial border the hallux right with subungual hematoma hallux right proximally.  Does not appear to be clinically infected just painful palpation  Vascular: Dorsalis Pedis artery and Posterior  Tibial artery pedal pulses are 2/4 bilateral with immedate capillary fill time. Pedal hair growth present. No varicosities and no lower extremity edema present bilateral.   Neruologic: Grossly intact via light touch bilateral. Vibratory intact via tuning fork bilateral. Protective threshold with Semmes Wienstein monofilament intact to all pedal sites bilateral. Patellar and Achilles deep tendon reflexes 2+ bilateral. No Babinski or clonus noted bilateral.   Musculoskeletal: No gross boney pedal deformities bilateral. No pain, crepitus, or limitation noted with foot and ankle range of motion bilateral. Muscular strength 5/5 in all groups tested bilateral.  Gait: Unassisted, Nonantalgic.    Radiographs:  None taken  Assessment & Plan:   Assessment: Ingrown toenail fibular border hallux right  Plan: Chemical matricectomy was performed today along the fibular border of the hallux right is tolerated well without complications.  She was provided with both oral and written home-going structure for the care and soaking of the toe as well as a prescription for Cortisporin Otic to be applied twice daily after soaking.  Follow-up with her in 2 weeks     Eian Vandervelden T. [place], [place]

## 2020-01-12 ENCOUNTER — Encounter: Payer: Self-pay | Admitting: Podiatry

## 2020-01-12 ENCOUNTER — Other Ambulatory Visit: Payer: Self-pay

## 2020-01-12 ENCOUNTER — Ambulatory Visit (INDEPENDENT_AMBULATORY_CARE_PROVIDER_SITE_OTHER): Payer: Medicare Other | Admitting: Podiatry

## 2020-01-12 DIAGNOSIS — Z9889 Other specified postprocedural states: Secondary | ICD-10-CM | POA: Diagnosis not present

## 2020-01-12 DIAGNOSIS — L03031 Cellulitis of right toe: Secondary | ICD-10-CM

## 2020-01-12 DIAGNOSIS — L600 Ingrowing nail: Secondary | ICD-10-CM

## 2020-01-12 NOTE — Progress Notes (Signed)
She presents today for follow-up of her matrixectomy hallux right fibular border.  She states that she is no longer soaking the gauze and developed a scab.  She states that there is no pain.  Objective: Vital signs are stable alert oriented x3 toe is mildly edematous there is mild erythema just along the fibular border there is a small eschar along the fibular margin that was resected.  Proximal lateral border does demonstrate a small bullous purple in color it appears to be a little blood blister associated with the chemical destruction of the matrix.  I lanced the area after Betadine prep.  Assessment well-healing matrixectomy.  Plan: Discussed etiology pathology conservative surgical therapies at this point time I recommend she continue to soak at least every other day at least once a day and apply Band-Aid daily but leave open at bedtime.  She will watch for signs and symptoms of infection should any arise she will notify us immediately.

## 2020-02-03 DIAGNOSIS — Z20828 Contact with and (suspected) exposure to other viral communicable diseases: Secondary | ICD-10-CM | POA: Diagnosis not present

## 2020-04-10 ENCOUNTER — Ambulatory Visit (INDEPENDENT_AMBULATORY_CARE_PROVIDER_SITE_OTHER): Payer: Medicare Other | Admitting: Podiatry

## 2020-04-10 ENCOUNTER — Other Ambulatory Visit: Payer: Self-pay

## 2020-04-10 ENCOUNTER — Encounter: Payer: Self-pay | Admitting: Podiatry

## 2020-04-10 DIAGNOSIS — M79676 Pain in unspecified toe(s): Secondary | ICD-10-CM | POA: Diagnosis not present

## 2020-04-10 DIAGNOSIS — Z9889 Other specified postprocedural states: Secondary | ICD-10-CM

## 2020-04-10 DIAGNOSIS — Q828 Other specified congenital malformations of skin: Secondary | ICD-10-CM | POA: Diagnosis not present

## 2020-04-10 DIAGNOSIS — B351 Tinea unguium: Secondary | ICD-10-CM

## 2020-04-10 DIAGNOSIS — L600 Ingrowing nail: Secondary | ICD-10-CM | POA: Diagnosis not present

## 2020-04-10 NOTE — Progress Notes (Signed)
She presents today chief complaint of painful elongated toenails 1 through 5 bilaterally.  Objective: Vital signs are stable alert oriented x3 there is no erythema edema cellulitis drainage or odor.  Nails are long thick yellow dystrophic-like mycotic.  Multiple reactive hyperkeratotic lesions plantar aspect of the bilateral foot  Assessment: Pain in limb secondary to onychomycosis ingrown margins.  Poor keratomas.  Plan: Debridement of toenails and calluses bilateral.

## 2020-05-16 ENCOUNTER — Ambulatory Visit: Payer: Medicare Other | Admitting: Family Medicine

## 2020-05-16 DIAGNOSIS — D1801 Hemangioma of skin and subcutaneous tissue: Secondary | ICD-10-CM | POA: Diagnosis not present

## 2020-05-16 DIAGNOSIS — L814 Other melanin hyperpigmentation: Secondary | ICD-10-CM | POA: Diagnosis not present

## 2020-05-16 DIAGNOSIS — L928 Other granulomatous disorders of the skin and subcutaneous tissue: Secondary | ICD-10-CM | POA: Diagnosis not present

## 2020-05-16 DIAGNOSIS — Z85828 Personal history of other malignant neoplasm of skin: Secondary | ICD-10-CM | POA: Diagnosis not present

## 2020-05-16 DIAGNOSIS — L821 Other seborrheic keratosis: Secondary | ICD-10-CM | POA: Diagnosis not present

## 2020-05-16 DIAGNOSIS — D485 Neoplasm of uncertain behavior of skin: Secondary | ICD-10-CM | POA: Diagnosis not present

## 2020-05-21 DIAGNOSIS — Z23 Encounter for immunization: Secondary | ICD-10-CM | POA: Diagnosis not present

## 2020-07-26 DIAGNOSIS — H2513 Age-related nuclear cataract, bilateral: Secondary | ICD-10-CM | POA: Diagnosis not present

## 2020-07-30 IMAGING — DX DG CHEST 2V
2 series · 2 of 2 positions shown · non-contrast
Comparison: 07/02/2010

CLINICAL DATA: Cough for 1 month

EXAM:
CHEST - 2 VIEW

[chest pa]
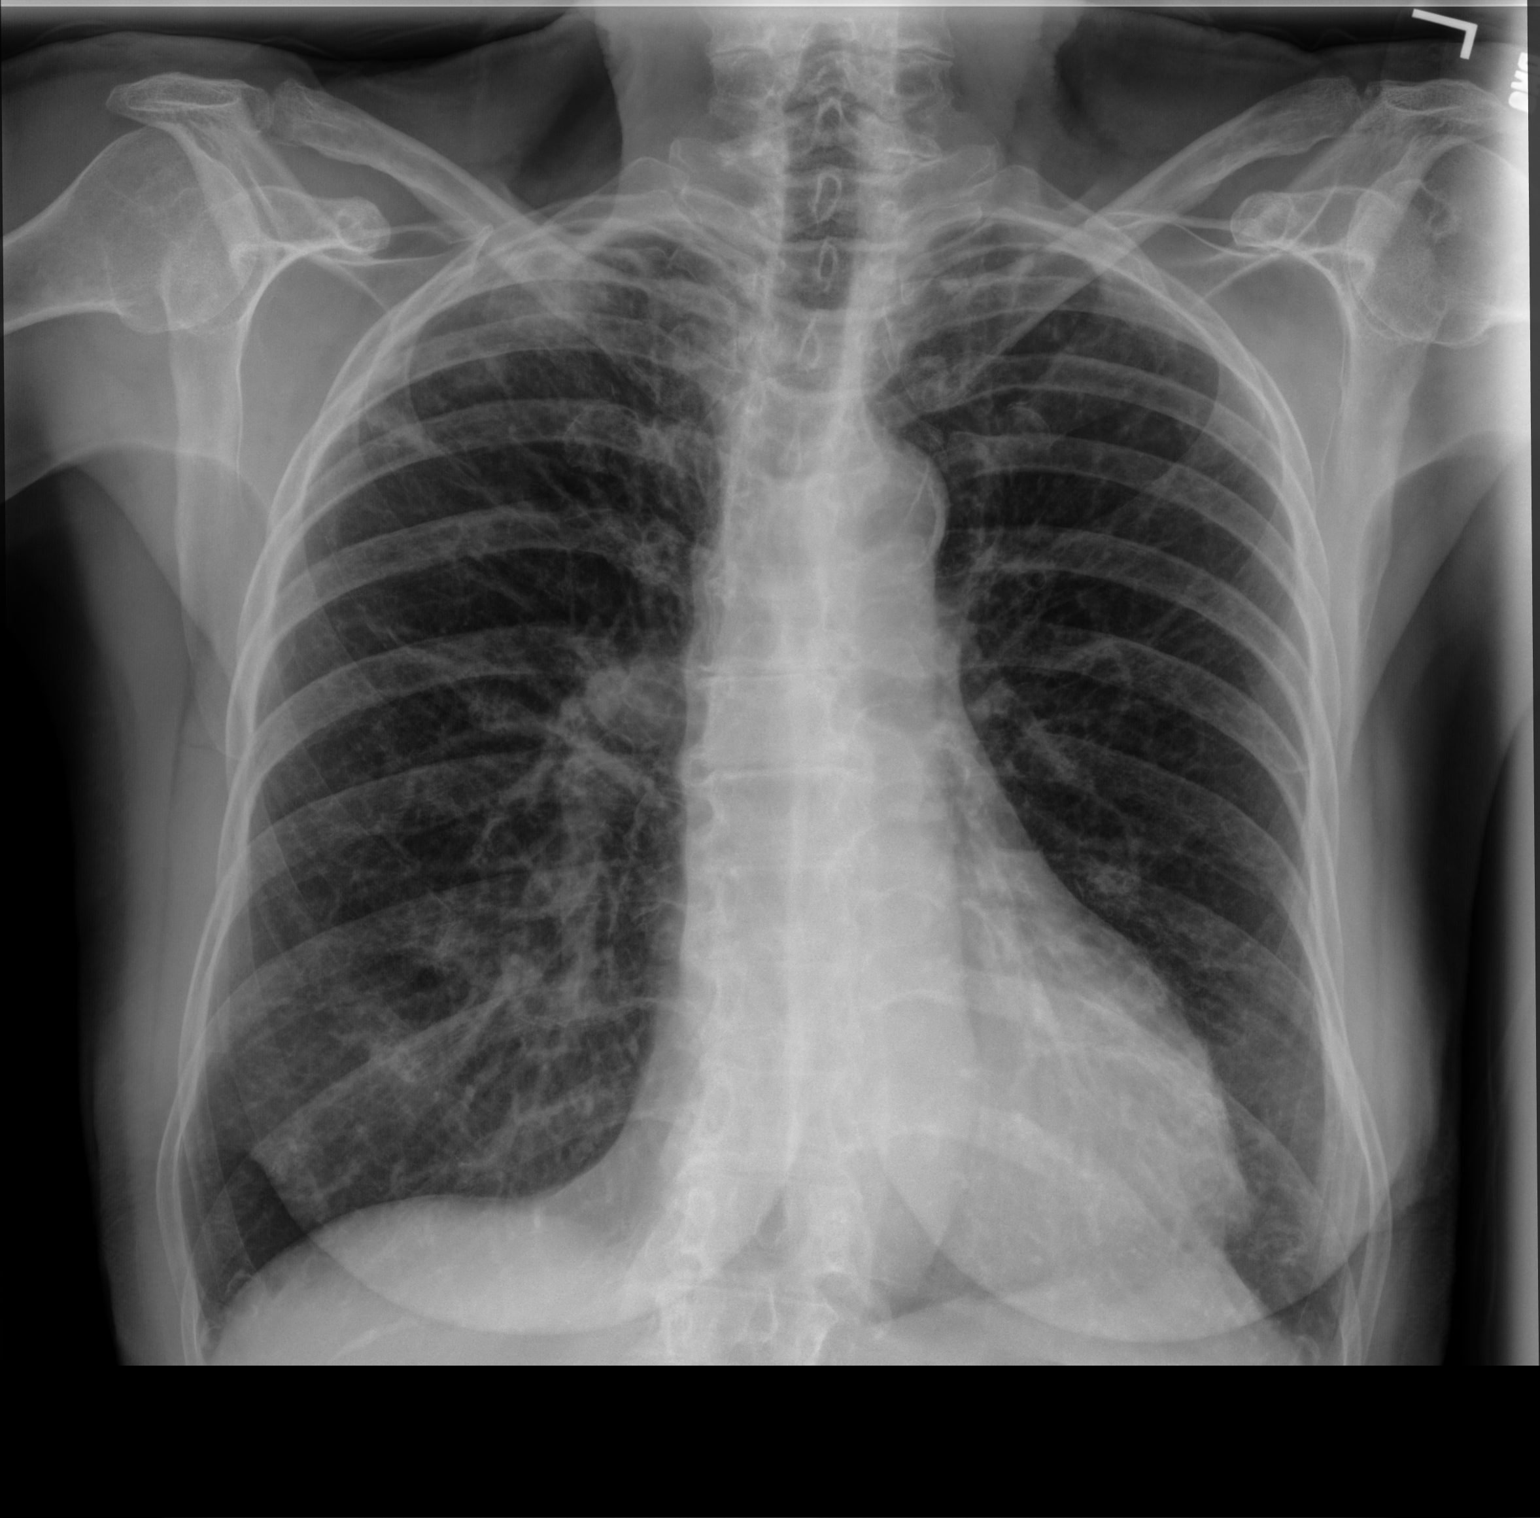

[chest lat]
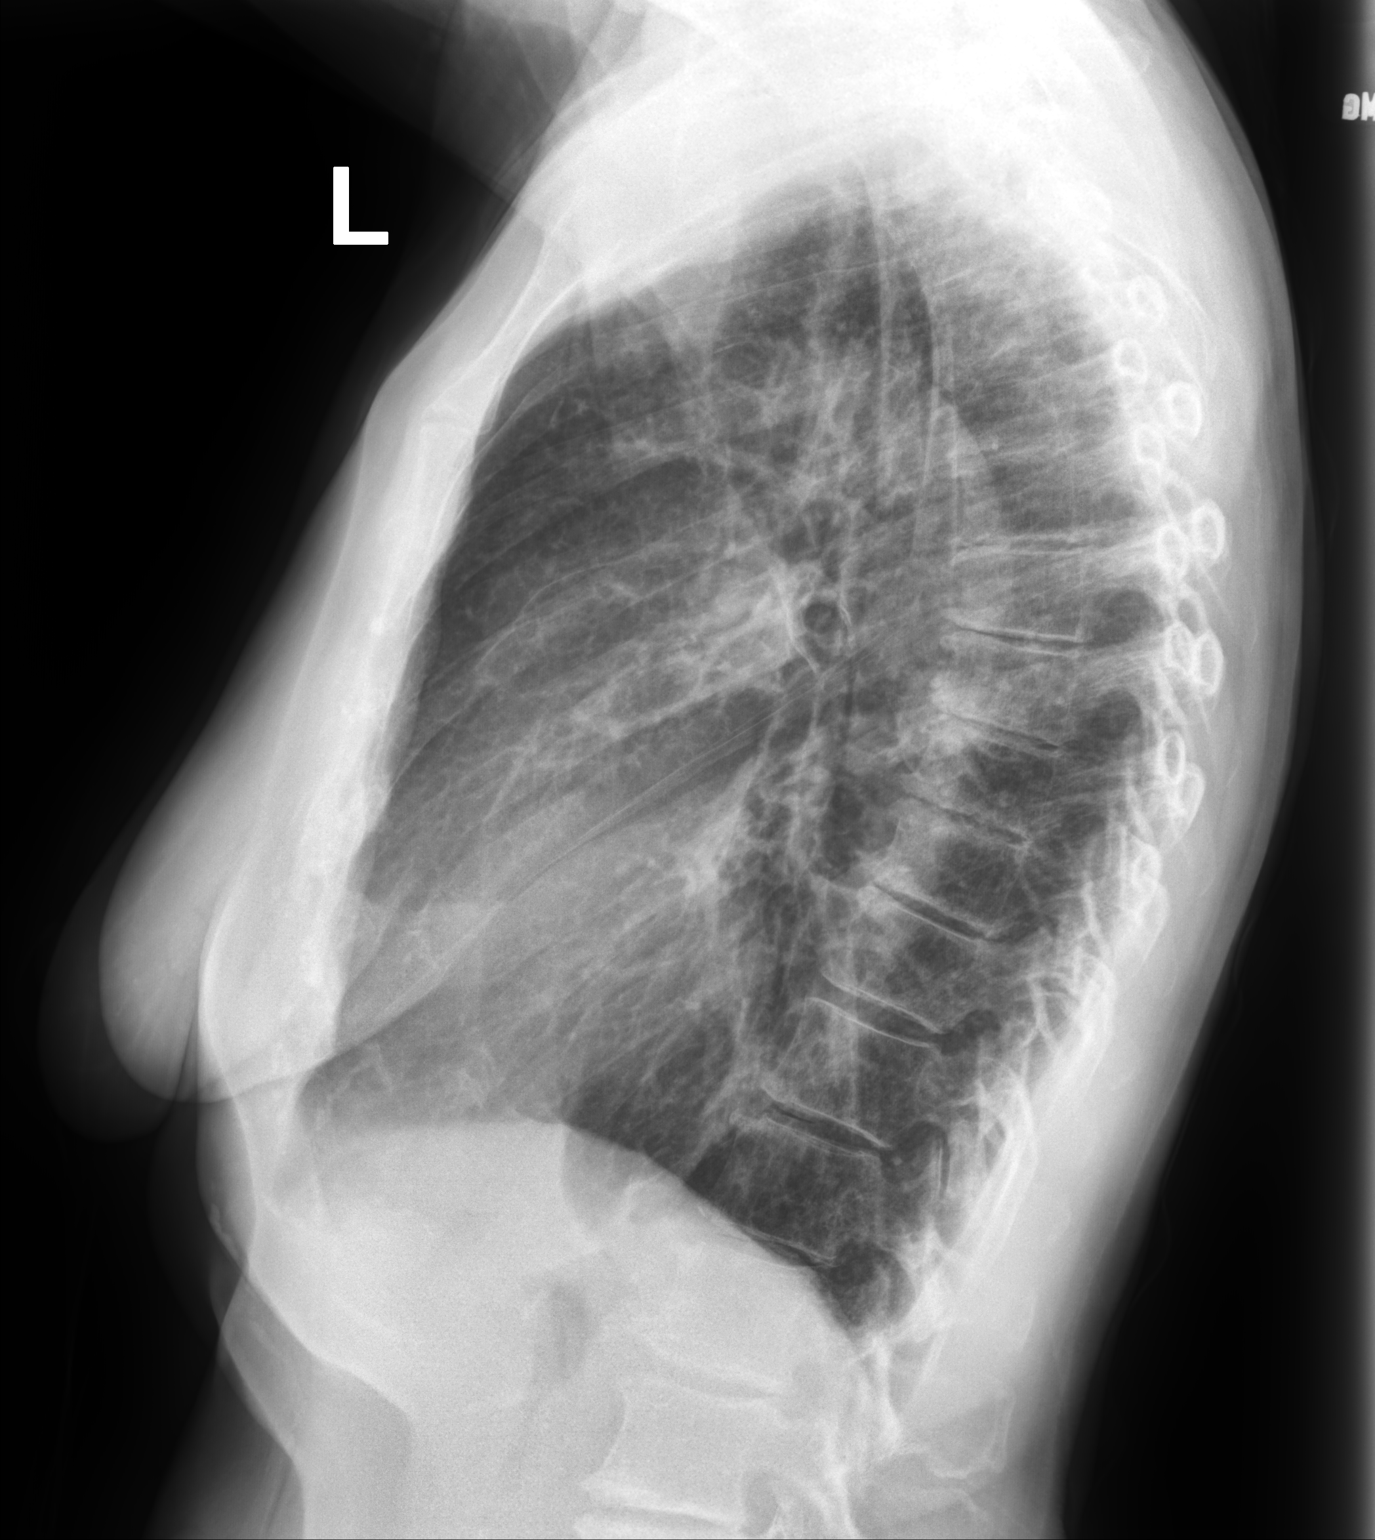

[2 of 2 positions shown; findings below may reference images not displayed]

FINDINGS: Cardiac shadow is within normal limits. The lungs are well aerated
bilaterally. Mild interstitial changes are noted accentuated by the
imaging technique. No focal infiltrate or effusion is seen.
Degenerative changes of the thoracic spine are noted. Chronic apical
scarring is again seen.
IMPRESSION: Chronic interstitial changes without acute abnormality.

## 2020-08-02 ENCOUNTER — Encounter (HOSPITAL_BASED_OUTPATIENT_CLINIC_OR_DEPARTMENT_OTHER): Payer: Self-pay | Admitting: *Deleted

## 2020-08-02 ENCOUNTER — Emergency Department (HOSPITAL_BASED_OUTPATIENT_CLINIC_OR_DEPARTMENT_OTHER)
Admission: EM | Admit: 2020-08-02 | Discharge: 2020-08-02 | Disposition: A | Discharge status: Home / Self Care | Payer: Medicare Other | Attending: Emergency Medicine | Admitting: Emergency Medicine

## 2020-08-02 ENCOUNTER — Other Ambulatory Visit: Payer: Self-pay

## 2020-08-02 ENCOUNTER — Emergency Department (HOSPITAL_BASED_OUTPATIENT_CLINIC_OR_DEPARTMENT_OTHER): Payer: Medicare Other

## 2020-08-02 ENCOUNTER — Other Ambulatory Visit (HOSPITAL_BASED_OUTPATIENT_CLINIC_OR_DEPARTMENT_OTHER): Payer: Self-pay | Admitting: Emergency Medicine

## 2020-08-02 ENCOUNTER — Telehealth: Payer: Self-pay | Admitting: Family Medicine

## 2020-08-02 DIAGNOSIS — Z20822 Contact with and (suspected) exposure to covid-19: Secondary | ICD-10-CM | POA: Insufficient documentation

## 2020-08-02 DIAGNOSIS — J90 Pleural effusion, not elsewhere classified: Secondary | ICD-10-CM | POA: Diagnosis not present

## 2020-08-02 DIAGNOSIS — Z87891 Personal history of nicotine dependence: Secondary | ICD-10-CM | POA: Insufficient documentation

## 2020-08-02 DIAGNOSIS — R059 Cough, unspecified: Secondary | ICD-10-CM | POA: Diagnosis not present

## 2020-08-02 DIAGNOSIS — J45909 Unspecified asthma, uncomplicated: Secondary | ICD-10-CM | POA: Insufficient documentation

## 2020-08-02 DIAGNOSIS — I5022 Chronic systolic (congestive) heart failure: Secondary | ICD-10-CM | POA: Insufficient documentation

## 2020-08-02 DIAGNOSIS — I5023 Acute on chronic systolic (congestive) heart failure: Secondary | ICD-10-CM

## 2020-08-02 DIAGNOSIS — I110 Hypertensive heart disease with heart failure: Secondary | ICD-10-CM | POA: Diagnosis not present

## 2020-08-02 DIAGNOSIS — R0602 Shortness of breath: Secondary | ICD-10-CM | POA: Insufficient documentation

## 2020-08-02 DIAGNOSIS — Z7982 Long term (current) use of aspirin: Secondary | ICD-10-CM | POA: Insufficient documentation

## 2020-08-02 LAB — CBC
HCT: 43.1 % (ref 36.0–46.0)
Hemoglobin: 14.3 g/dL (ref 12.0–15.0)
MCH: 30.1 pg (ref 26.0–34.0)
MCHC: 33.2 g/dL (ref 30.0–36.0)
MCV: 90.7 fL (ref 80.0–100.0)
Platelets: 214 10*3/uL (ref 150–400)
RBC: 4.75 MIL/uL (ref 3.87–5.11)
RDW: 13.8 % (ref 11.5–15.5)
WBC: 3.3 10*3/uL — ABNORMAL LOW (ref 4.0–10.5)
nRBC: 0 % (ref 0.0–0.2)

## 2020-08-02 LAB — BASIC METABOLIC PANEL
Anion gap: 10 (ref 5–15)
BUN: 13 mg/dL (ref 8–23)
CO2: 25 mmol/L (ref 22–32)
Calcium: 9.2 mg/dL (ref 8.9–10.3)
Chloride: 104 mmol/L (ref 98–111)
Creatinine, Ser: 0.63 mg/dL (ref 0.44–1.00)
GFR, Estimated: >60 mL/min (ref >60)
Glucose, Bld: 95 mg/dL (ref 70–99)
Potassium: 3.9 mmol/L (ref 3.5–5.1)
Sodium: 139 mmol/L (ref 135–145)

## 2020-08-02 LAB — BRAIN NATRIURETIC PEPTIDE: B Natriuretic Peptide: 1145.5 pg/mL — ABNORMAL HIGH (ref 0.0–100.0)

## 2020-08-02 LAB — SARS CORONAVIRUS 2 (TAT 6-24 HRS): SARS Coronavirus 2: NEGATIVE

## 2020-08-02 LAB — TROPONIN I (HIGH SENSITIVITY)
Troponin I (High Sensitivity): 49 ng/L — ABNORMAL HIGH (ref <18)
Troponin I (High Sensitivity): 53 ng/L — ABNORMAL HIGH (ref <18)

## 2020-08-02 MED ORDER — DOXYCYCLINE HYCLATE 100 MG PO CAPS: 100.0000 mg | ORAL_CAPSULE | Freq: Two times a day (BID) | ORAL | 0 refills | Status: DC

## 2020-08-02 MED ORDER — FUROSEMIDE 10 MG/ML IJ SOLN
20.0000 mg | Freq: Once | INTRAMUSCULAR | Status: AC
  Administered 2020-08-02: 20 mg via INTRAVENOUS
  Filled 2020-08-02: qty 2

## 2020-08-02 MED ORDER — FUROSEMIDE 20 MG PO TABS: 20.0000 mg | ORAL_TABLET | Freq: Every day | ORAL | 0 refills | Status: DC

## 2020-08-02 MED ORDER — POTASSIUM CHLORIDE CRYS ER 20 MEQ PO TBCR: 20.0000 meq | EXTENDED_RELEASE_TABLET | Freq: Every day | ORAL | 0 refills | Status: DC

## 2020-08-02 MED FILL — DOXYCYCLINE HYCLATE 100 MG: 100 | 7 days supply | Qty: 14 | Fill #0

## 2020-08-02 MED FILL — POTASSIUM CHLORIDE CRYS ER: 20 | 4 days supply | Qty: 4 | Fill #0

## 2020-08-02 MED FILL — FUROSEMIDE 20 MG TAB: 20 | 14 days supply | Qty: 14 | Fill #0

## 2020-08-02 NOTE — Telephone Encounter (Addendum)
Received call from [facility] [place] at [facility] ER Ph # [phone]  Pt is in ER for CHF/hx of heart failure with respiratory issues related to that. He feels pt is needing echo but not sure if he is going to admit her. He has advised pt to priortize health as she has cancelled 2 appts with our office. If not admitted he will likely send home with lasix. He is waiting on some results still. Pt is scheduled visit with Dr. Loletha Grayer 08/10/2020 10:30am. He is advising f/u with cardiology as well.

## 2020-08-02 NOTE — ED Notes (Signed)
Pt c/o severe SHOB after ambulating to the BR; tachypnea noted; RT to bedside; pt was able to recover quickly once she was resting and slow deep breaths encouraged.

## 2020-08-02 NOTE — ED Triage Notes (Signed)
She is here with c.o SOB. Hx of CHF. Dry cough. Her symptoms have been on and off for a couple of weeks. No Covid exposure.

## 2020-08-02 NOTE — ED Notes (Signed)
Pt on monitor and vitals cycling 

## 2020-08-02 NOTE — ED Provider Notes (Addendum)
[facility] EMERGENCY DEPARTMENT Provider Note   CSN: [id number] Arrival date & time: 08/02/20  1111     History Chief Complaint  Patient presents with  . Shortness of Breath    Sabrina Hernandez is a 86 y.o. female.  HPI Patient is an 86 year old female with a past medical history significant for CHF  Patient states that she takes no medications at home given that her CHF seems to have not been very severe.  She states that she does not have any history of hypertension or other medical issues.  She states that she is healthy.  She states that for the past 3 months she has been having worsening shortness of breath.  She states that she goes on long walks and has noticed that she gets more short of breath than she used to.  She notes that she is having having some wheezing.  Over the past 2 weeks she has noted some intermittent coughing.  No hemoptysis or chest pain.  She states last night she felt like she could not sleep at all because she felt short of breath.  She states that her shortness of breath is sometimes worse at night.  She has had weight loss over the past several months as well.  She has not been able to see her primary care doctor because she states that she normally sees Dr. Ethelene Hal of [facility].  She states she has not been able to make appointment due to availability with their office.  Patient denies any chest pain.  States that her shortness of breath is not preventing her from doing her daily activities     Past Medical History:  Diagnosis Date  . CHF (congestive heart failure) ([place])   . Diverticulitis   . Shortness of breath dyspnea    with exertion     Patient Active Problem List   Diagnosis Date Noted  . Reactive airway disease 08/04/2018  . Cough 08/04/2018  . History of CHF (congestive heart failure) 08/04/2018  . Dental abscess 11/04/2016  . Abnormal liver enzymes 05/05/2016  . Leg edema, right 10/30/2015  . Microhematuria  10/30/2015  . Chronic systolic congestive heart failure ([place]) 10/03/2015  . Lip ulcer 06/28/2015  . Essential hypertension 04/30/2015  . Gastroesophageal reflux disease without esophagitis 04/30/2015  . Left patella fracture 01/29/2015    Past Surgical History:  Procedure Laterality Date  . CERVICAL CONE BIOPSY    . HAMMER TOE SURGERY     right foot   . ORIF PATELLA Left 01/29/2015   Procedure: OPEN REDUCTION INTERNAL (ORIF) FIXATION LEFT PATELLA;  Surgeon: Paralee Cancel, MD;  Location: [facility];  Service: Orthopedics;  Laterality: Left;     OB History   No obstetric history on file.     Family History  Problem Relation Age of Onset  . Heart failure Mother   . Early death Mother   . Alcohol abuse Father   . Mental illness Father   . Stroke Father     Social History   Tobacco Use  . Smoking status: Former Research scientist (life sciences)  . Smokeless tobacco: Never Used  Vaping Use  . Vaping Use: Never used  Substance Use Topics  . Alcohol use: Yes    Comment: social   . Drug use: No    Home Medications Prior to Admission medications   Medication Sig Start Date End Date Taking? Authorizing Provider  aspirin 81 MG chewable tablet Chew 81 mg by mouth daily.   Yes  [provider]  Multiple Vitamin (MULTIVITAMIN WITH MINERALS) TABS tablet Take 1 tablet by mouth every morning.   Yes [provider]  NEOMYCIN-POLYMYXIN-HYDROCORTISONE (CORTISPORIN) 1 % SOLN OTIC solution Apply 1-2 drops to toe BID after soaking 12/29/19   Hyatt, Max T, DPM    Allergies    Patient has no known allergies.  Review of Systems   Review of Systems  Constitutional: Positive for appetite change (causing some weight loss) and fatigue. Negative for chills and fever.  HENT: Negative for congestion.   Eyes: Negative for pain.  Respiratory: Positive for cough and shortness of breath.   Cardiovascular: Negative for chest pain and leg swelling.  Gastrointestinal: Negative for abdominal pain, diarrhea, nausea  and vomiting.  Genitourinary: Negative for dysuria.  Musculoskeletal: Negative for myalgias.  Skin: Negative for rash.  Neurological: Negative for dizziness and headaches.    Physical Exam Updated Vital Signs BP (!) 146/104 Comment: RA  Pulse 83 Comment: RA  Temp (!) 97.1 F (36.2 C) (Oral)   Resp 18 Comment: RA  Ht 5' 0.5" (1.537 m)   Wt 47.2 kg   SpO2 96% Comment: RA  BMI 19.98 kg/m   Physical Exam Vitals and nursing note reviewed.  Constitutional:      General: She is not in acute distress.    Comments: Pleasant well-appearing 86 year old.  In no acute distress.  Sitting comfortably in bed.  Able answer questions appropriately follow commands. No increased work of breathing. Speaking in full sentences.  HENT:     Head: Normocephalic and atraumatic.     Nose: Nose normal.  Eyes:     General: No scleral icterus. Cardiovascular:     Rate and Rhythm: Normal rate and regular rhythm.     Pulses: Normal pulses.     Heart sounds: Normal heart sounds.     Comments: Radial artery pulses are 3+ and symmetric Pulmonary:     Effort: Pulmonary effort is normal. No respiratory distress.     Breath sounds: No wheezing.     Comments: Faint crackles in the lung bases.  Lungs are otherwise clear to auscultation all fields. Otherwise good air movement.  No increased work of breathing, no tachypnea.  Speaking in full sentences. Abdominal:     Palpations: Abdomen is soft.     Tenderness: There is no abdominal tenderness.  Musculoskeletal:     Cervical back: Normal range of motion.     Right lower leg: No edema.     Left lower leg: No edema.  Skin:    General: Skin is warm and dry.     Capillary Refill: Capillary refill takes less than 2 seconds.  Neurological:     Mental Status: She is alert. Mental status is at baseline.  Psychiatric:        Mood and Affect: Mood normal.        Behavior: Behavior normal.     ED Results / Procedures / Treatments   Labs (all labs ordered are  listed, but only abnormal results are displayed) Labs Reviewed  CBC - Abnormal; Notable for the following components:      Result Value   WBC 3.3 (*)    All other components within normal limits  SARS CORONAVIRUS 2 (TAT 6-24 HRS)  BASIC METABOLIC PANEL  BRAIN NATRIURETIC PEPTIDE  TROPONIN I (HIGH SENSITIVITY)    EKG EKG Interpretation  Date/Time:  Thursday August 02 2020 11:34:10 EST Ventricular Rate:  85 PR Interval:    QRS Duration: 151  QT Interval:  421 QTC Calculation: 501 R Axis:   174 Text Interpretation: Sinus rhythm LAE, consider biatrial enlargement Consider left ventricular hypertrophy Probable lateral infarct, age indeterminate Anterior Q waves, possibly due to LVH Prolonged QT interval No previous tracing Confirmed by Blanchie Dessert [phone]) on 08/02/2020 12:03:14 PM   Radiology No results found.  Procedures Procedures   Medications Ordered in ED Medications - No data to display  ED Course  I have reviewed the triage vital signs and the nursing notes.  Pertinent labs & imaging results that were available during my care of the patient were reviewed by me and considered in my medical decision making (see chart for details).  Patient is 86 year old female she is a past medical history of CHF.  I read patient's records and it appears that patient's last echo was done approximately 5 years ago had an LVEF of 20-25% of the time.  Patient does have some faint crackles, is not hypoxic.  Responded well to Lasix. Vital signs are within normal limits apart from borderline elevated blood pressures.  I do have concern for our feel exacerbation therefore will obtain lab work. BNP elevated at 1145 consistent with CHF CBC with mild leukopenia concerning for possible COVID BMP without significant electrolyte abnormality. Troponin x2     49>>> 53 no significant delta she is not having any chest pain. EKG with no significant changes. Covid test negative.  I reviewed  patient's echo from 10/01/2015 patient had LVEF of 20-25% of the time with mild to moderate mitral regurg no evidence of aortic stenosis.  Evidence of LV dilation.  I also reviewed patient's visit 08/04/2018 with Dr. Ethelene Hal where patient was complaining of similar symptoms with cough and shortness of breath.  Was treated for asthma. Therefore may have hx of reactive airways -- she seems uncertain about this.    Clinical Course as of 08/02/20 1245  Thu Aug 02, 2020  1245 1. Patchy bilateral airspace disease, left greater than right, suggests multifocal pneumonia. Given focal appearance of the right infrahilar opacity, follow-up recommended to ensure complete resolution. CT imaging could be used to further evaluate as clinically warranted. 2. Probable small bilateral pleural effusions. [WF]    Clinical Course User Index [WF] Tedd Sias, [place]   Chest x-ray with evidence of atypical pulmonary edema she does have some small bilateral pleural effusions I doubt-as this is described by radiology report-that this is infectious as she has no symptoms of this such as fever, tachycardia and her cough is mild however will cover with doxycycline.  She will follow-up closely with her primary care doctor as well as cardiology.  I have personally called and set up appointment for both her cardiologist as well as her primary care doctor.  When discussing with her PCP in terms of the patient has canceled her past 2 appointments due to having bridge games that were scheduled at that time.  Will DC with doxycycline for possible PNA, lasix and K+  MDM Rules/Calculators/A&P                          I discussed this case with my attending physician who cosigned this note including patient's presenting symptoms, physical exam, and planned diagnostics and interventions. Attending physician stated agreement with plan or made changes to plan which were implemented.   Attending physician assessed patient at  bedside.  Final Clinical Impression(s) / ED Diagnoses Final diagnoses:  None    Rx /  DC Orders ED Discharge Orders    None       Tedd Sias, [place] 08/02/20 2038    Tedd Sias, [place] 08/02/20 2038    Blanchie Dessert, MD 08/02/20 [phone]

## 2020-08-02 NOTE — Discharge Instructions (Addendum)
You have been scheduled for 2 appointments. Dr. Loletha Grayer 08/10/2020 10:30am Cardiology appointment has been set up for 08/09/2020 at [place] [place] PHONE NUMBER PROVIDED TO CONFIRM [name].   Please keep these 2 appointments that I have made for you.  You may always return to the ER should he have any new or concerning symptoms, more difficulty breathing, chest pain, other new or concerning symptoms.  I am starting you on a medication called Lasix.  This will increase the frequency with which you pee.  This is to be expected.  I have also provided you with 3 days of potassium tablets.  Please take as prescribed.  Note you will need to have your electrolytes rechecked at your follow-up visit.

## 2020-08-02 NOTE — Telephone Encounter (Signed)
Pt should keep appts with cardio on 08/09/20 and me on 08/10/20.

## 2020-08-06 NOTE — Progress Notes (Signed)
Cardiology Office Note:    Date:  08/09/2020   ID:  Adiyah Lame, DOB 1934/11/04, MRN [id number]  PCP:  Ronnald Nian, DO  [facility] Cardiologist:  [name], MD  [facility] Electrophysiologist:  None   Referring MD: Ronnald Nian, DO    History of Present Illness:    Sabrina Hernandez is a 86 y.o. female with a hx of HTN, GERD and chronic systolic heart failure who was referred by Dr. Bryan Lemma for further management of HFrEF.  Was seen in [facility] ED on 08/02/20 with worsening DOE over the past couple of months as well as orthopnea. In the ED, ECG with NSR, LAE, LBBB. LVH. TTE with LVEF 20-25% in 2017. BNP 1145. Trop 49>>53. COVID negative. She was diuresed with lasix IV with good response. She was discharged home with plans to follow-up with Cardiology for further management.  Since her ED visit, the patient states she was doing okay until this morning when she began to feel more short of breath. She has been taking lasix 20mg  daily as well as doxycycline for possible pneumonia. She weighs herself every morning and notes she has been losing weight, but currently is reporting orthopnea and increasing dyspnea. Has been sleeping propped up on several pillows in bed. No nausea, vomiting or bleeding issues.   The patient also states that she has noted increasing shortness of breath over the past several months with decreased exercise capacity prior to going to the ER as detailed above. Specifically, she used to be able to walk 3 miles but now can only walk 1 mile at a slower pace. Feels like she has been slowing down. No chest pain, palpitations, LE edema. Has been having orthopnea as above.   She was initially diagnosed with HFrEF in 2017 when she presented with worsening SOB. Was seen at [facility]. Declined cath at that time but myoview was negative for ischemia. She was initially on metop/ACE but these were later stopped due to fatigue. She has not followed regularly  with a cardiologist. Last TTE was in 2017. No ICD in place.   Family history: brother with HFrEF and CAD, mother with HFrEF, brother with history of valve repair  Past Medical History:  Diagnosis Date  . CHF (congestive heart failure) ([place])   . Diverticulitis   . Shortness of breath dyspnea    with exertion     Past Surgical History:  Procedure Laterality Date  . CERVICAL CONE BIOPSY    . HAMMER TOE SURGERY     right foot   . ORIF PATELLA Left 01/29/2015   Procedure: OPEN REDUCTION INTERNAL (ORIF) FIXATION LEFT PATELLA;  Surgeon: Paralee Cancel, MD;  Location: [facility];  Service: Orthopedics;  Laterality: Left;    Current Medications: Current Meds  Medication Sig  . aspirin 81 MG chewable tablet Chew 81 mg by mouth daily.  Marland Kitchen losartan (COZAAR) 25 MG tablet Take 1 tablet (25 mg total) by mouth every morning.  . metoprolol succinate (TOPROL XL) 25 MG 24 hr tablet Take 0.5 tablets (12.5 mg total) by mouth at bedtime.  . Multiple Vitamin (MULTIVITAMIN WITH MINERALS) TABS tablet Take 1 tablet by mouth every morning.  . [DISCONTINUED] doxycycline (VIBRAMYCIN) 100 MG capsule Take 1 capsule (100 mg total) by mouth 2 (two) times daily for 7 days.  . [DISCONTINUED] furosemide (LASIX) 20 MG tablet Take 1 tablet (20 mg total) by mouth daily for 14 days.  . [DISCONTINUED] NEOMYCIN-POLYMYXIN-HYDROCORTISONE (CORTISPORIN) 1 % SOLN OTIC solution  Apply 1-2 drops to toe BID after soaking     Allergies:   Patient has no known allergies.   Social History   Socioeconomic History  . Marital status: Married    Spouse name: Not on file  . Number of children: Not on file  . Years of education: Not on file  . Highest education level: Not on file  Occupational History  . Not on file  Tobacco Use  . Smoking status: Former Research scientist (life sciences)  . Smokeless tobacco: Never Used  Vaping Use  . Vaping Use: Never used  Substance and Sexual Activity  . Alcohol use: Yes    Comment: social   . Drug use: No  . Sexual  activity: Not on file  Other Topics Concern  . Not on file  Social History Narrative   She is from [place], [place] originally but has lived in [place] for 30 years. She would like to move back. She is married with 5 grown children (3 in [place] and 2 in [place]. She is a retired Psychologist, prison and probation services.   Social Determinants of Health   Financial Resource Strain: Not on file  Food Insecurity: Not on file  Transportation Needs: Not on file  Physical Activity: Not on file  Stress: Not on file  Social Connections: Not on file     Family History: The patient's family history includes Alcohol abuse in her father; Early death in her mother; Heart failure in her mother; Mental illness in her father; Stroke in her father.  ROS:   Please see the history of present illness.    Review of Systems  Constitutional: Positive for malaise/fatigue. Negative for chills and fever.  HENT: Negative for nosebleeds.   Eyes: Negative for blurred vision.  Respiratory: Positive for shortness of breath. Negative for hemoptysis.   Cardiovascular: Positive for orthopnea. Negative for chest pain, palpitations, claudication, leg swelling and PND.  Gastrointestinal: Negative for nausea and vomiting.  Genitourinary: Negative for hematuria.  Musculoskeletal: Negative for falls.  Neurological: Negative for dizziness and loss of consciousness.  Endo/Heme/Allergies: Negative for polydipsia.  Psychiatric/Behavioral: Negative for substance abuse.    EKGs/Labs/Other Studies Reviewed:    The following studies were reviewed today: Myoview 2017: 1. No clear evidence for reversibility or ischemia..   2. Dilated left ventricle with diffuse hypokinesia and paradoxical  motion along the septal wall.   3. Left ventricular ejection fraction is 28%.   4. High-risk stress test findings*.   TTE 2017: Findings  Mitral Valve  Structurally normal mitral valve.  Mild to moderate mitral regurgitation by color flow doppler examination.   Aortic Valve  Structurally normal aortic valve with good leaflet mobility, and no  regurgitation by color flow doppler examination.  Tricuspid Valve  Tricuspid valve is structurally normal. Trace tricuspid regurgitation by color  flow doppler examination. Normal estimated pulmonary artery pressure  Pulmonic Valve  Pulmonic valve is structurally normal.  Mild pulmonic regurgitation present by color flow doppler examination.  No evidence of pulmonic stenosis.  Left Atrium  Left atrial volume index of 38.1 ml per meters squared BSA.  Moderately dilated left atrium.  Left Ventricle  Mildly dilated left ventricle.  Severe global LV hypokinesis.  Right Atrium  Normal right atrium.  Right Ventricle  Normal right ventricle structure and function.  Pericardial Effusion  No evidence of pericardial effusion.  Miscellaneous  The aorta is within normal limits.  The IVC is normal   EKG:  EKG 08/02/20: NSR with LAE, LBBB  Recent Labs:  08/02/2020: B Natriuretic Peptide 1,145.5; BUN 13; Creatinine, Ser 0.63; Hemoglobin 14.3; Platelets 214; Potassium 3.9; Sodium 139  Recent Lipid Panel No results found for: CHOL, TRIG, HDL, CHOLHDL, VLDL, LDLCALC, LDLDIRECT   Physical Exam:    VS:  BP 126/78   Pulse 98   Ht 5' (1.524 m)   Wt 106 lb (48.1 kg)   SpO2 98%   BMI 20.70 kg/m     Wt Readings from Last 3 Encounters:  08/09/20 106 lb (48.1 kg)  08/02/20 104 lb (47.2 kg)  08/04/18 112 lb 6 oz (51 kg)     GEN:  Well nourished, well developed in no acute distress HEENT: Normal NECK: mild JVD to base of neck CARDIAC: RRR, no murmurs, rubs, gallops RESPIRATORY:  Clear to auscultation without rales, wheezing or rhonchi  ABDOMEN: Soft, non-tender, non-distended MUSCULOSKELETAL:  No edema; No deformity  SKIN: Warm and dry NEUROLOGIC:  Alert and oriented x 3 PSYCHIATRIC:  Normal affect   ASSESSMENT:    1. Acute on chronic systolic congestive heart failure ([place])   2. Primary hypertension     PLAN:    In order of problems listed above:  #Acute on chronic systolic heart failure: LVEF 20-25%. Suspected non-ischemic CM with negative myoview in 2017, however, has not had cath. Recent acute decompensation with volume overload and elevated BNP. Not currently on GDMT and not followed by Cardiology by my review of the chart. Needs aggressive medical optimization. -Check TTE to reassess LVEF -Check BNP today -Continue lasix 20mg  daily and repeat BMET today -Start metoprolol 12.5mg  XL daily -Start losartan 25mg  today--change to entresto pending EF -Add Spironolactone at next visit -Would benefit from SGLT-2 inhibitor, will add as able -Low Na diet -Daily weights -If EF remains <35% despite GDMT, will need CRT-D (has LBBB) -May merit ischemic work-up pending TTE  #HTN: Well controlled. -Start metop and losartan as above -Repeat BMET as above  Medication Adjustments/Labs and Tests Ordered: Current medicines are reviewed at length with the patient today.  Concerns regarding medicines are outlined above.  Orders Placed This Encounter  Procedures  . Basic Metabolic Panel (BMET)  . B Nat Peptide  . Basic Metabolic Panel (BMET)  . ECHOCARDIOGRAM COMPLETE   Meds ordered this encounter  Medications  . furosemide (LASIX) 20 MG tablet    Sig: Take 1 tablet (20 mg total) by mouth daily.    Dispense:  90 tablet    Refill:  2  . metoprolol succinate (TOPROL XL) 25 MG 24 hr tablet    Sig: Take 0.5 tablets (12.5 mg total) by mouth at bedtime.    Dispense:  45 tablet    Refill:  2  . losartan (COZAAR) 25 MG tablet    Sig: Take 1 tablet (25 mg total) by mouth every morning.    Dispense:  90 tablet    Refill:  2    Patient Instructions  Medication Instructions:  Your physician has recommended you make the following change in your medication:  Start Metoprolol Succinate 12.5 mg by mouth daily. Take at night Start Losartan 25 mg by mouth daily.  Take in the morning    *If you  need a refill on your cardiac medications before your next appointment, please call your pharmacy*   Lab Work: Lab work to be done today--BMP and BNP Your physician recommends that you return for lab work in: one week.  BMP.  This is not fasting.  The lab is open from 7:30-4:15  If you  have labs (blood work) drawn today and your tests are completely normal, you will receive your results only by: Marland Kitchen MyChart Message (if you have MyChart) OR . A paper copy in the mail If you have any lab test that is abnormal or we need to change your treatment, we will call you to review the results.   Testing/Procedures: Your physician has requested that you have an echocardiogram. Echocardiography is a painless test that uses sound waves to create images of your heart. It provides your doctor with information about the size and shape of your heart and how well your heart's chambers and valves are working. This procedure takes approximately one hour. There are no restrictions for this procedure.     Follow-Up: At [facility], you and your health needs are our priority.  As part of our continuing mission to provide you with exceptional heart care, we have created designated Provider Care Teams.  These Care Teams include your primary Cardiologist (physician) and Advanced Practice Providers (APPs -  Physician Assistants and Nurse Practitioners) who all work together to provide you with the care you need, when you need it.  We recommend signing up for the patient portal called "MyChart".  Sign up information is provided on this After Visit Summary.  MyChart is used to connect with patients for Virtual Visits (Telemedicine).  Patients are able to view lab/test results, encounter notes, upcoming appointments, etc.  Non-urgent messages can be sent to your provider as well.   To learn more about what you can do with MyChart, go to [email or web address].    Your next appointment:   3 week(s)  The format for  your next appointment:   In Person  Provider:   You may see [name], MD or one of the following Advanced Practice Providers on your designated Care Team:    [name], PA-C  Robbie Lis, [place]    Other Instructions      Signed, [name], MD  08/09/2020 11:24 AM    [place]

## 2020-08-08 ENCOUNTER — Ambulatory Visit: Payer: Medicare Other | Admitting: Family Medicine

## 2020-08-09 ENCOUNTER — Encounter: Payer: Self-pay | Admitting: Cardiology

## 2020-08-09 ENCOUNTER — Other Ambulatory Visit: Payer: Self-pay

## 2020-08-09 ENCOUNTER — Telehealth: Payer: Self-pay

## 2020-08-09 ENCOUNTER — Ambulatory Visit (INDEPENDENT_AMBULATORY_CARE_PROVIDER_SITE_OTHER): Payer: Medicare Other | Admitting: Cardiology

## 2020-08-09 VITALS — BP 126/78 | HR 98 | Ht 60.0 in | Wt 106.0 lb

## 2020-08-09 DIAGNOSIS — I5022 Chronic systolic (congestive) heart failure: Secondary | ICD-10-CM | POA: Diagnosis not present

## 2020-08-09 DIAGNOSIS — I5023 Acute on chronic systolic (congestive) heart failure: Secondary | ICD-10-CM

## 2020-08-09 DIAGNOSIS — I10 Essential (primary) hypertension: Secondary | ICD-10-CM

## 2020-08-09 MED ORDER — FUROSEMIDE 20 MG PO TABS: 20.0000 mg | ORAL_TABLET | Freq: Every day | ORAL | 2 refills | Status: DC

## 2020-08-09 MED ORDER — LOSARTAN POTASSIUM 25 MG PO TABS: 25.0000 mg | ORAL_TABLET | Freq: Every morning | ORAL | 2 refills | Status: DC

## 2020-08-09 MED ORDER — METOPROLOL SUCCINATE ER 25 MG PO TB24: 12.5000 mg | ORAL_TABLET | Freq: Every day | ORAL | 2 refills | Status: DC

## 2020-08-09 NOTE — Patient Instructions (Signed)
Medication Instructions:  Your physician has recommended you make the following change in your medication:  Start Metoprolol Succinate 12.5 mg by mouth daily. Take at night Start Losartan 25 mg by mouth daily.  Take in the morning    *If you need a refill on your cardiac medications before your next appointment, please call your pharmacy*   Lab Work: Lab work to be done today--BMP and BNP Your physician recommends that you return for lab work in: one week.  BMP.  This is not fasting.  The lab is open from 7:30-4:15  If you have labs (blood work) drawn today and your tests are completely normal, you will receive your results only by: Marland Kitchen MyChart Message (if you have MyChart) OR . A paper copy in the mail If you have any lab test that is abnormal or we need to change your treatment, we will call you to review the results.   Testing/Procedures: Your physician has requested that you have an echocardiogram. Echocardiography is a painless test that uses sound waves to create images of your heart. It provides your doctor with information about the size and shape of your heart and how well your heart's chambers and valves are working. This procedure takes approximately one hour. There are no restrictions for this procedure.     Follow-Up: At [facility], you and your health needs are our priority.  As part of our continuing mission to provide you with exceptional heart care, we have created designated Provider Care Teams.  These Care Teams include your primary Cardiologist (physician) and Advanced Practice Providers (APPs -  Physician Assistants and Nurse Practitioners) who all work together to provide you with the care you need, when you need it.  We recommend signing up for the patient portal called "MyChart".  Sign up information is provided on this After Visit Summary.  MyChart is used to connect with patients for Virtual Visits (Telemedicine).  Patients are able to view lab/test results,  encounter notes, upcoming appointments, etc.  Non-urgent messages can be sent to your provider as well.   To learn more about what you can do with MyChart, go to [email or web address].    Your next appointment:   3 week(s)  The format for your next appointment:   In Person  Provider:   You may see [name], MD or one of the following Advanced Practice Providers on your designated Care Team:    [name], PA-C  Robbie Lis, [place]    Other Instructions

## 2020-08-09 NOTE — Telephone Encounter (Signed)
Orders placed for Pro BNP.

## 2020-08-10 ENCOUNTER — Telehealth: Payer: Self-pay

## 2020-08-10 ENCOUNTER — Inpatient Hospital Stay: Payer: Medicare Other | Admitting: Family Medicine

## 2020-08-10 DIAGNOSIS — I5022 Chronic systolic (congestive) heart failure: Secondary | ICD-10-CM

## 2020-08-10 DIAGNOSIS — I5023 Acute on chronic systolic (congestive) heart failure: Secondary | ICD-10-CM

## 2020-08-10 LAB — BASIC METABOLIC PANEL
BUN/Creatinine Ratio: 18 (ref 12–28)
BUN: 15 mg/dL (ref 8–27)
CO2: 27 mmol/L (ref 20–29)
Calcium: 10.1 mg/dL (ref 8.7–10.3)
Chloride: 103 mmol/L (ref 96–106)
Creatinine, Ser: 0.84 mg/dL (ref 0.57–1.00)
GFR calc Af Amer: 74 mL/min/{1.73_m2} (ref >59)
GFR calc non Af Amer: 64 mL/min/{1.73_m2} (ref >59)
Glucose: 75 mg/dL (ref 65–99)
Potassium: 4.2 mmol/L (ref 3.5–5.2)
Sodium: 143 mmol/L (ref 134–144)

## 2020-08-10 LAB — PRO B NATRIURETIC PEPTIDE: NT-Pro BNP: 4251 pg/mL — ABNORMAL HIGH (ref 0–738)

## 2020-08-10 MED ORDER — FUROSEMIDE 20 MG PO TABS: 20.0000 mg | ORAL_TABLET | Freq: Two times a day (BID) | ORAL | 3 refills | Status: DC

## 2020-08-10 NOTE — Addendum Note (Signed)
Addended by: Precious Gilding on: 08/10/2020 04:38 PM   Modules accepted: Orders

## 2020-08-10 NOTE — Telephone Encounter (Signed)
Notified patient of lab results and Dr. Jacolyn Reedy recommendations. Patient states that she weighs herself every morning as directed.  She understands to take furosemide 20mg  twice daily.  Made a 2 week follow up appointment for 08/24/20 at 0900 and BMP and BNP labs for the same day.

## 2020-08-10 NOTE — Telephone Encounter (Signed)
-----   Message from [name], MD sent at 08/10/2020  1:17 PM EST ----- Her fluid levels are very high which is why she has been feeling so short of breath. We need to increase her lasix to 20mg  BID and see her in follow-up within the next couple of weeks for repeat labs. Otherwise her kidney function and electrolytes look good. Please ask her to keep a log of her daily weights for [place] to review.

## 2020-08-14 ENCOUNTER — Other Ambulatory Visit: Payer: Self-pay

## 2020-08-15 ENCOUNTER — Inpatient Hospital Stay: Payer: Medicare Other | Admitting: Family Medicine

## 2020-08-16 ENCOUNTER — Other Ambulatory Visit: Payer: Medicare Other

## 2020-08-16 ENCOUNTER — Other Ambulatory Visit: Payer: Medicare Other | Admitting: *Deleted

## 2020-08-16 ENCOUNTER — Other Ambulatory Visit: Payer: Self-pay

## 2020-08-16 DIAGNOSIS — I10 Essential (primary) hypertension: Secondary | ICD-10-CM | POA: Diagnosis not present

## 2020-08-16 DIAGNOSIS — I313 Pericardial effusion (noninflammatory): Secondary | ICD-10-CM | POA: Diagnosis not present

## 2020-08-16 DIAGNOSIS — I5023 Acute on chronic systolic (congestive) heart failure: Secondary | ICD-10-CM | POA: Diagnosis not present

## 2020-08-16 DIAGNOSIS — R195 Other fecal abnormalities: Secondary | ICD-10-CM | POA: Diagnosis not present

## 2020-08-16 DIAGNOSIS — I5022 Chronic systolic (congestive) heart failure: Secondary | ICD-10-CM | POA: Diagnosis not present

## 2020-08-17 ENCOUNTER — Telehealth: Payer: Self-pay

## 2020-08-17 LAB — BASIC METABOLIC PANEL
BUN/Creatinine Ratio: 18 (ref 12–28)
BUN: 17 mg/dL (ref 8–27)
CO2: 24 mmol/L (ref 20–29)
Calcium: 10 mg/dL (ref 8.7–10.3)
Chloride: 97 mmol/L (ref 96–106)
Creatinine, Ser: 0.95 mg/dL (ref 0.57–1.00)
GFR calc Af Amer: 64 mL/min/{1.73_m2} (ref >59)
GFR calc non Af Amer: 56 mL/min/{1.73_m2} — ABNORMAL LOW (ref >59)
Glucose: 75 mg/dL (ref 65–99)
Potassium: 4.3 mmol/L (ref 3.5–5.2)
Sodium: 138 mmol/L (ref 134–144)

## 2020-08-17 LAB — PRO B NATRIURETIC PEPTIDE: NT-Pro BNP: 2765 pg/mL — ABNORMAL HIGH (ref 0–738)

## 2020-08-17 NOTE — Telephone Encounter (Signed)
-----   Message from [name], MD sent at 08/17/2020  9:01 AM EST ----- Her kidney function and electrolytes look stable. Her fluid levels are improving. No changes to medications at this time and I will see her next week!

## 2020-08-17 NOTE — Telephone Encounter (Signed)
The patient has been notified of the result and verbalized understanding.  All questions (if any) were answered. Wilma Flavin, RN 08/17/2020 9:06 AM

## 2020-08-20 NOTE — Progress Notes (Unsigned)
Cardiology Office Note:    Date:  08/24/2020   ID:  Sabrina Hernandez, DOB 10-03-1934, MRN [id number]  PCP:  Ronnald Nian, DO   [place]  Cardiologist:  [name], MD  Advanced Practice Provider:  No care team member to display Electrophysiologist:  None   Referring MD: Ronnald Nian, DO    History of Present Illness:    Sabrina Hernandez is a 86 y.o. female with a hx of HTN, GERD and chronic systolic heart failure who presents to clinic for follow-up of her HFrEF.  She was initially diagnosed with HFrEF in 2017 when she presented with worsening SOB. Was seen at [facility]. Declined cath at that time but myoview was negative for ischemia. She was initially on metop/ACE but these were later stopped due to fatigue. She has not followed regularly with a cardiologist. Last TTE was in 2017. No ICD in place.   Was seen in [facility] ED on 08/02/20 with worsening DOE over the past couple of months as well as orthopnea. In the ED, ECG with NSR, LAE, LBBB. LVH. TTE with LVEF 20-25% in 2017. BNP 1145. Trop 49>>53. COVID negative. She was diuresed with lasix IV with good response. She was discharged home with plans to follow-up with Cardiology for further management.  During our last visit on 08/09/20 the patient was doing better but continued to have SOB and orthopnea. BNP 4251. We resumed diuresis with lasix 20mg  BID.  Today, the patient states that she has been having black stools. Has been ongoing for the past several days. Having BM at least once per day. Not on iron supplementation. On ASA but not AC. No lightheadedness, dizziness, syncope. Had some bloody stool for a couple days after being constipated, which she attributed to hemorrhoids but this resolved.   Otherwise, the patient states she is much improved from prior visit. No orthopnea but continues to feel short winded with prolonged exertion. No nausea, vomiting. No chest pain. Able to do 2 laps around  the neighborhood daily before SOB setting. No significant LE edema. Weights have been staying relatively stable at 104. Has been taking all medications as prescribed.   Past Medical History:  Diagnosis Date  . CHF (congestive heart failure) ([place])   . Diverticulitis   . Shortness of breath dyspnea    with exertion     Past Surgical History:  Procedure Laterality Date  . CERVICAL CONE BIOPSY    . HAMMER TOE SURGERY     right foot   . ORIF PATELLA Left 01/29/2015   Procedure: OPEN REDUCTION INTERNAL (ORIF) FIXATION LEFT PATELLA;  Surgeon: Paralee Cancel, MD;  Location: [facility] ORS;  Service: Orthopedics;  Laterality: Left;    Current Medications: Current Meds  Medication Sig  . dapagliflozin propanediol (FARXIGA) 10 MG TABS tablet Take 1 tablet (10 mg total) by mouth daily before breakfast.  . esomeprazole (NEXIUM) 40 MG capsule Take 1 capsule (40 mg total) by mouth 2 (two) times daily before a meal.  . furosemide (LASIX) 20 MG tablet Take 1 tablet (20 mg total) by mouth 2 (two) times daily.  . metoprolol succinate (TOPROL XL) 25 MG 24 hr tablet Take 0.5 tablets (12.5 mg total) by mouth at bedtime.  . Multiple Vitamin (MULTIVITAMIN WITH MINERALS) TABS tablet Take 1 tablet by mouth every morning.  . sacubitril-valsartan (ENTRESTO) 24-26 MG Take 1 tablet by mouth 2 (two) times daily.  . [DISCONTINUED] aspirin 81 MG chewable tablet Chew 81 mg  by mouth daily.  . [DISCONTINUED] losartan (COZAAR) 25 MG tablet Take 1 tablet (25 mg total) by mouth every morning.     Allergies:   Patient has no known allergies.   Social History   Socioeconomic History  . Marital status: Married    Spouse name: Not on file  . Number of children: Not on file  . Years of education: Not on file  . Highest education level: Not on file  Occupational History  . Not on file  Tobacco Use  . Smoking status: Former Research scientist (life sciences)  . Smokeless tobacco: Never Used  Vaping Use  . Vaping Use: Never used  Substance and Sexual  Activity  . Alcohol use: Yes    Comment: social   . Drug use: No  . Sexual activity: Not on file  Other Topics Concern  . Not on file  Social History Narrative   She is from [place], [place] originally but has lived in [place] for 30 years. She would like to move back. She is married with 5 grown children (3 in [place] and 2 in [place]. She is a retired Psychologist, prison and probation services.   Social Determinants of Health   Financial Resource Strain: Not on file  Food Insecurity: Not on file  Transportation Needs: Not on file  Physical Activity: Not on file  Stress: Not on file  Social Connections: Not on file     Family History: The patient's family history includes Alcohol abuse in her father; Early death in her mother; Heart failure in her mother; Mental illness in her father; Stroke in her father.  ROS:   Please see the history of present illness.    Review of Systems  Constitutional: Negative for chills and fever.  HENT: Negative for congestion and sore throat.   Eyes: Negative for blurred vision and redness.  Respiratory: Positive for shortness of breath.   Cardiovascular: Negative for chest pain, palpitations, orthopnea, claudication, leg swelling and PND.  Gastrointestinal: Positive for melena. Negative for diarrhea, nausea and vomiting.  Genitourinary: Negative for dysuria and flank pain.  Musculoskeletal: Positive for joint pain. Negative for falls.  Neurological: Negative for dizziness and loss of consciousness.  Endo/Heme/Allergies: Negative for polydipsia.  Psychiatric/Behavioral: Negative for substance abuse.    EKGs/Labs/Other Studies Reviewed:    The following studies were reviewed today: Myoview 2017: 1. No clear evidence for reversibility or ischemia..   2. Dilated left ventricle with diffuse hypokinesia and paradoxical  motion along the septal wall.   3. Left ventricular ejection fraction is 28%.   4. High-risk stress test findings*.   TTE 2017: Findings  Mitral Valve   Structurally normal mitral valve.  Mild to moderate mitral regurgitation by color flow doppler examination.  Aortic Valve  Structurally normal aortic valve with good leaflet mobility, and no  regurgitation by color flow doppler examination.  Tricuspid Valve  Tricuspid valve is structurally normal. Trace tricuspid regurgitation by color  flow doppler examination. Normal estimated pulmonary artery pressure  Pulmonic Valve  Pulmonic valve is structurally normal.  Mild pulmonic regurgitation present by color flow doppler examination.  No evidence of pulmonic stenosis.  Left Atrium  Left atrial volume index of 38.1 ml per meters squared BSA.  Moderately dilated left atrium.  Left Ventricle  Mildly dilated left ventricle.  Severe global LV hypokinesis.  Right Atrium  Normal right atrium.  Right Ventricle  Normal right ventricle structure and function.  Pericardial Effusion  No evidence of pericardial effusion.  Miscellaneous  The aorta is  within normal limits.  The IVC is normal   Recent Labs: 08/02/2020: B Natriuretic Peptide 1,145.5; Hemoglobin 14.3; Platelets 214 08/16/2020: BUN 17; Creatinine, Ser 0.95; NT-Pro BNP 2,765; Potassium 4.3; Sodium 138  Recent Lipid Panel No results found for: CHOL, TRIG, HDL, CHOLHDL, VLDL, LDLCALC, LDLDIRECT    Physical Exam:    VS:  BP 130/60   Pulse 60   Ht 5' 0.5" (1.537 m)   Wt 108 lb 9.6 oz (49.3 kg)   SpO2 96%   BMI 20.86 kg/m     Wt Readings from Last 3 Encounters:  08/24/20 108 lb 9.6 oz (49.3 kg)  08/09/20 106 lb (48.1 kg)  08/02/20 104 lb (47.2 kg)     GEN:  Well nourished, well developed in no acute distress HEENT: Normal NECK: No JVD; No carotid bruits CARDIAC: RRR, 2/6 systolic murmur. No rubs or gallops RESPIRATORY:  Clear to auscultation without rales, wheezing or rhonchi  ABDOMEN: Soft, non-tender, non-distended MUSCULOSKELETAL:  No edema; No deformity  SKIN: Warm and dry NEUROLOGIC:  Alert and oriented x  3 PSYCHIATRIC:  Normal affect   ASSESSMENT:    1. Acute on chronic systolic congestive heart failure (HCC)   2. Dark stools   3. Chronic systolic congestive heart failure ([place])   4. Primary hypertension    PLAN:    In order of problems listed above:  #Acute on chronic systolic heart failure: LVEF 20-25%. Suspected non-ischemic CM with negative myoview in 2017, however, has not had cath. Recent acute decompensation with volume overload and elevated BNP. Currently improved with NYHA class II symptoms today. Appears close to euvolemic. Has been compliant with all home medications -Follow-up TTE -Continue lasix 20mg  BID -Continue metoprolol 12.5mg  XL daily -Stop losartan and start entresto 24/26mg  BID -Add Spironolactone at next visit -Start farxiga -Low Na diet -Daily weights -If EF remains <35% despite GDMT, will need CRT-D (has LBBB) -May merit ischemic work-up pending TTE  #Black Stool: Concern for melena. Has been ongoing for the past several days. On ASA 81mg  daily but not on AC. No dizziness, lightheadedness or worsening of baseline SOB. -Stop ASA -Check CBC -Refer to GI  #HTN: Well controlled. -Continue metop and start entresto as above -Repeat BMET as above      Medication Adjustments/Labs and Tests Ordered: Current medicines are reviewed at length with the patient today.  Concerns regarding medicines are outlined above.  Orders Placed This Encounter  Procedures  . CBC  . Ambulatory referral to Gastroenterology   Meds ordered this encounter  Medications  . esomeprazole (NEXIUM) 40 MG capsule    Sig: Take 1 capsule (40 mg total) by mouth 2 (two) times daily before a meal.    Dispense:  60 capsule    Refill:  5  . dapagliflozin propanediol (FARXIGA) 10 MG TABS tablet    Sig: Take 1 tablet (10 mg total) by mouth daily before breakfast.    Dispense:  90 tablet    Refill:  1  . sacubitril-valsartan (ENTRESTO) 24-26 MG    Sig: Take 1 tablet by mouth 2 (two)  times daily.    Dispense:  60 tablet    Refill:  1    Patient Instructions  Medication Instructions:  Your physician has recommended you make the following change in your medication:  1.  STOP Aspirin 2.  STOP Losartan 3.  START Nexium 40 mg taking 1 twice a day 4.  START Entresto 24/26 taking 1 twice a day 5.  START Wilder Glade  10 mg taking 1 daily   *If you need a refill on your cardiac medications before your next appointment, please call your pharmacy*   Lab Work: TODAY:  BMET, PRO BNP, & CBC  If you have labs (blood work) drawn today and your tests are completely normal, you will receive your results only by: Marland Kitchen MyChart Message (if you have MyChart) OR . A paper copy in the mail If you have any lab test that is abnormal or we need to change your treatment, we will call you to review the results.   Testing/Procedures: None ordered   Follow-Up: At [facility], you and your health needs are our priority.  As part of our continuing mission to provide you with exceptional heart care, we have created designated Provider Care Teams.  These Care Teams include your primary Cardiologist (physician) and Advanced Practice Providers (APPs -  Physician Assistants and Nurse Practitioners) who all work together to provide you with the care you need, when you need it.  We recommend signing up for the patient portal called "MyChart".  Sign up information is provided on this After Visit Summary.  MyChart is used to connect with patients for Virtual Visits (Telemedicine).  Patients are able to view lab/test results, encounter notes, upcoming appointments, etc.  Non-urgent messages can be sent to your provider as well.   To learn more about what you can do with MyChart, go to [email or web address].    Your next appointment:   Keep your appointments as scheduled  The format for your next appointment:   In Person  Provider:   Gwyndolyn Kaufman, MD   Other Instructions       Signed, [name], MD  08/24/2020 10:32 AM    [place]

## 2020-08-24 ENCOUNTER — Other Ambulatory Visit: Payer: Self-pay

## 2020-08-24 ENCOUNTER — Ambulatory Visit (INDEPENDENT_AMBULATORY_CARE_PROVIDER_SITE_OTHER): Payer: Medicare Other | Admitting: Cardiology

## 2020-08-24 ENCOUNTER — Other Ambulatory Visit: Payer: Medicare Other

## 2020-08-24 ENCOUNTER — Encounter: Payer: Self-pay | Admitting: Cardiology

## 2020-08-24 VITALS — BP 130/60 | HR 60 | Ht 60.5 in | Wt 108.6 lb

## 2020-08-24 DIAGNOSIS — I5022 Chronic systolic (congestive) heart failure: Secondary | ICD-10-CM

## 2020-08-24 DIAGNOSIS — I5023 Acute on chronic systolic (congestive) heart failure: Secondary | ICD-10-CM | POA: Diagnosis not present

## 2020-08-24 DIAGNOSIS — R195 Other fecal abnormalities: Secondary | ICD-10-CM

## 2020-08-24 DIAGNOSIS — I10 Essential (primary) hypertension: Secondary | ICD-10-CM | POA: Diagnosis not present

## 2020-08-24 LAB — CBC
Hematocrit: 45.4 % (ref 34.0–46.6)
Hemoglobin: 15.1 g/dL (ref 11.1–15.9)
MCH: 29.8 pg (ref 26.6–33.0)
MCHC: 33.3 g/dL (ref 31.5–35.7)
MCV: 90 fL (ref 79–97)
Platelets: 248 10*3/uL (ref 150–450)
RBC: 5.07 x10E6/uL (ref 3.77–5.28)
RDW: 13.1 % (ref 11.7–15.4)
WBC: 4.7 10*3/uL (ref 3.4–10.8)

## 2020-08-24 MED ORDER — ESOMEPRAZOLE MAGNESIUM 40 MG PO CPDR: 40.0000 mg | DELAYED_RELEASE_CAPSULE | Freq: Two times a day (BID) | ORAL | 5 refills | Status: DC

## 2020-08-24 MED ORDER — DAPAGLIFLOZIN PROPANEDIOL 10 MG PO TABS: 10.0000 mg | ORAL_TABLET | Freq: Every day | ORAL | 1 refills | Status: DC

## 2020-08-24 MED ORDER — ENTRESTO 24-26 MG PO TABS: 1.0000 | ORAL_TABLET | Freq: Two times a day (BID) | ORAL | 1 refills | Status: DC

## 2020-08-24 NOTE — Patient Instructions (Addendum)
Medication Instructions:  Your physician has recommended you make the following change in your medication:  1.  STOP Aspirin 2.  STOP Losartan 3.  START Nexium 40 mg taking 1 twice a day 4.  START Entresto 24/26 taking 1 twice a day 5.  START Farxiga 10 mg taking 1 daily   *If you need a refill on your cardiac medications before your next appointment, please call your pharmacy*   Lab Work: TODAY:  BMET, PRO BNP, & CBC  If you have labs (blood work) drawn today and your tests are completely normal, you will receive your results only by: Marland Kitchen MyChart Message (if you have MyChart) OR . A paper copy in the mail If you have any lab test that is abnormal or we need to change your treatment, we will call you to review the results.   Testing/Procedures: None ordered   Follow-Up: At [facility], you and your health needs are our priority.  As part of our continuing mission to provide you with exceptional heart care, we have created designated Provider Care Teams.  These Care Teams include your primary Cardiologist (physician) and Advanced Practice Providers (APPs -  Physician Assistants and Nurse Practitioners) who all work together to provide you with the care you need, when you need it.  We recommend signing up for the patient portal called "MyChart".  Sign up information is provided on this After Visit Summary.  MyChart is used to connect with patients for Virtual Visits (Telemedicine).  Patients are able to view lab/test results, encounter notes, upcoming appointments, etc.  Non-urgent messages can be sent to your provider as well.   To learn more about what you can do with MyChart, go to [email or web address].    Your next appointment:   Keep your appointments as scheduled  The format for your next appointment:   In Person  Provider:   Gwyndolyn Kaufman, MD   Other Instructions

## 2020-08-27 ENCOUNTER — Telehealth: Payer: Self-pay

## 2020-08-27 NOTE — Telephone Encounter (Signed)
**Note De-Identified Noella Kipnis Obfuscation** Non formulary Esomeprazole PA started through covermymeds. Key: [id number]

## 2020-08-29 ENCOUNTER — Encounter: Payer: Self-pay | Admitting: Physician Assistant

## 2020-08-29 NOTE — Telephone Encounter (Signed)
**Note De-Identified Althia Egolf Obfuscation** Letter received from [facility] stating that they approved the pts Esomeprazole non formulary PA. Approval is valid until 08/27/2021  [facility] is aware of this approval.

## 2020-08-30 ENCOUNTER — Ambulatory Visit (HOSPITAL_COMMUNITY): Payer: Medicare Other | Attending: Cardiology

## 2020-08-30 ENCOUNTER — Other Ambulatory Visit: Payer: Self-pay

## 2020-08-30 DIAGNOSIS — I5023 Acute on chronic systolic (congestive) heart failure: Secondary | ICD-10-CM

## 2020-08-30 LAB — ECHOCARDIOGRAM COMPLETE
Area-P 1/2: 4.21 cm2
S' Lateral: 5.4 cm

## 2020-08-30 MED ORDER — PERFLUTREN LIPID MICROSPHERE
1.0000 mL | INTRAVENOUS | Status: AC | PRN
  Administered 2020-08-30: 1 mL via INTRAVENOUS

## 2020-08-30 MED ORDER — PERFLUTREN LIPID MICROSPHERE: 1.0000 mL | INTRAVENOUS | Status: DC | PRN

## 2020-09-02 NOTE — Progress Notes (Signed)
Cardiology Office Note:    Date:  09/06/2020   ID:  Sabrina Hernandez, DOB 1935-03-03, MRN [id number]  PCP:  Ronnald Nian, DO   [place]  Cardiologist:  [name], MD  Advanced Practice Provider:  No care team member to display Electrophysiologist:  None    Referring MD: Ronnald Nian, DO     History of Present Illness:    Sabrina Hernandez is a 86 y.o. female with a hx of HTN, GERD and systolic heart failure who presents to clinic for follow-up of her HFrEF.  She was initiallydiagnosed with HFrEF in 2017when she presented with worsening SOB. Was seen at [facility]. Declined cath at that time but myoview was negative for ischemia.She was initially on metop/ACE but these were later stopped due to fatigue. She has not followed regularly with a cardiologist. Last TTE was in 2017. No ICD in place.  Was seen in [place] 08/02/20 with worsening DOE over the past couple of months as well as orthopnea. In the ED, ECG with NSR, LAE, LBBB. LVH. TTE with LVEF 20-25% in2017. BNP 1145. Trop 49>>53. COVID negative. She was diuresed with lasix IV with good response. She was discharged home with plans to follow-up with Cardiology for further management.  During our visit 08/09/20 the patient was doing better but continued to have SOB and orthopnea. BNP 4251. We resumed diuresis with lasix 20mg  BID.  During our visit on 08/24/20, the patient reported having black stools. On ASA but not AC. We stopped her ASA and referred to GI. CBC with HgB 15.1.  TTE on 08/29/20 with LVEF <20% with global hypokinesis. G1DD, mild MR, small to moderate pericardial effusion.  Today, the patient states that her breathing is significantly improved. She has resumed walking and is able to back to her 3 laps around the neighborhood without issues. No chest pain, orthopnea, LE edema, lightheadedness or syncope. She has been taking all medications as prescribed but states she  does not want to keep adding things if she can manage not to. We discussed that given her LVEF<20% a LHC/RHC would be helpful to discern if she has significant CAD that may be causing the reduced pumping function. The patient states she would not like to pursue any invasive testing at all. She is only wanting to pursue medical management at this time even if that means that this may shorten her lifespan.  Past Medical History:  Diagnosis Date  . CHF (congestive heart failure) ([place])   . Diverticulitis   . Shortness of breath dyspnea    with exertion     Past Surgical History:  Procedure Laterality Date  . CERVICAL CONE BIOPSY    . HAMMER TOE SURGERY     right foot   . ORIF PATELLA Left 01/29/2015   Procedure: OPEN REDUCTION INTERNAL (ORIF) FIXATION LEFT PATELLA;  Surgeon: Paralee Cancel, MD;  Location: [facility];  Service: Orthopedics;  Laterality: Left;    Current Medications: Current Meds  Medication Sig  . dapagliflozin propanediol (FARXIGA) 10 MG TABS tablet Take 1 tablet (10 mg total) by mouth daily before breakfast.  . esomeprazole (NEXIUM) 40 MG capsule Take 1 capsule (40 mg total) by mouth 2 (two) times daily before a meal.  . furosemide (LASIX) 20 MG tablet Take 1 tablet (20 mg total) by mouth daily.  . metoprolol succinate (TOPROL XL) 25 MG 24 hr tablet Take 0.5 tablets (12.5 mg total) by mouth at bedtime.  . Multiple  Vitamin (MULTIVITAMIN WITH MINERALS) TABS tablet Take 1 tablet by mouth every morning.  . sacubitril-valsartan (ENTRESTO) 49-51 MG Take 1 tablet by mouth 2 (two) times daily.  Marland Kitchen spironolactone (ALDACTONE) 25 MG tablet Take 0.5 tablets (12.5 mg total) by mouth daily.  . [DISCONTINUED] furosemide (LASIX) 20 MG tablet Take 1 tablet (20 mg total) by mouth 2 (two) times daily.  . [DISCONTINUED] sacubitril-valsartan (ENTRESTO) 24-26 MG Take 1 tablet by mouth 2 (two) times daily.     Allergies:   Patient has no known allergies.   Social History   Socioeconomic History   . Marital status: Married    Spouse name: Not on file  . Number of children: Not on file  . Years of education: Not on file  . Highest education level: Not on file  Occupational History  . Not on file  Tobacco Use  . Smoking status: Former Research scientist (life sciences)  . Smokeless tobacco: Never Used  Vaping Use  . Vaping Use: Never used  Substance and Sexual Activity  . Alcohol use: Yes    Comment: social   . Drug use: No  . Sexual activity: Not on file  Other Topics Concern  . Not on file  Social History Narrative   She is from [place], [place] originally but has lived in [place] for 30 years. She would like to move back. She is married with 5 grown children (3 in [place] and 2 in [place]. She is a retired Psychologist, prison and probation services.   Social Determinants of Health   Financial Resource Strain: Not on file  Food Insecurity: Not on file  Transportation Needs: Not on file  Physical Activity: Not on file  Stress: Not on file  Social Connections: Not on file     Family History: The patient's family history includes Alcohol abuse in her father; Early death in her mother; Heart failure in her mother; Mental illness in her father; Stroke in her father.  ROS:   Please see the history of present illness.    Review of Systems  Constitutional: Negative for chills and fever.  HENT: Negative for hearing loss.   Eyes: Negative for blurred vision and redness.  Respiratory: Negative for shortness of breath.   Cardiovascular: Negative for chest pain, palpitations, orthopnea, claudication, leg swelling and PND.  Gastrointestinal: Negative for melena, nausea and vomiting.  Genitourinary: Negative for dysuria.  Musculoskeletal: Positive for joint pain. Negative for falls.  Neurological: Negative for dizziness and loss of consciousness.  Endo/Heme/Allergies: Negative for polydipsia.  Psychiatric/Behavioral: Negative for substance abuse.    EKGs/Labs/Other Studies Reviewed:    The following studies were reviewed today: TTE  08/30/20: 1. Left ventricular ejection fraction, by estimation, is <20%. The left  ventricle has severely decreased function. The left ventricle demonstrates  global hypokinesis. The left ventricular internal cavity size was mildly  dilated. Left ventricular  diastolic parameters are consistent with Grade I diastolic dysfunction  (impaired relaxation). Elevated left ventricular end-diastolic pressure.  2. Right ventricular systolic function is normal. The right ventricular  size is normal. Tricuspid regurgitation signal is inadequate for assessing  PA pressure.  3. The mitral valve is degenerative. Mild mitral valve regurgitation. No  evidence of mitral stenosis.  4. The aortic valve is tricuspid. Aortic valve regurgitation is not  visualized. Mild aortic valve sclerosis is present, with no evidence of  aortic valve stenosis.  5. The inferior vena cava is normal in size with greater than 50%  respiratory variability, suggesting right atrial pressure of 3 mmHg.  6. A small to moderate pericardial effusion is present. The pericardial  effusion is circumferential with no evidence of pericardial tamponade.  Myoview 2017: 1. No clear evidence for reversibility or ischemia..   2. Dilated left ventricle with diffuse hypokinesia and paradoxical  motion along the septal wall.   3. Left ventricular ejection fraction is 28%.   4. High-risk stress test findings*.   TTE 2017: Findings  Mitral Valve  Structurally normal mitral valve.  Mild to moderate mitral regurgitation by color flow doppler examination.  Aortic Valve  Structurally normal aortic valve with good leaflet mobility, and no  regurgitation by color flow doppler examination.  Tricuspid Valve  Tricuspid valve is structurally normal. Trace tricuspid regurgitation by color  flow doppler examination. Normal estimated pulmonary artery pressure  Pulmonic Valve  Pulmonic valve is structurally normal.  Mild pulmonic  regurgitation present by color flow doppler examination.  No evidence of pulmonic stenosis.  Left Atrium  Left atrial volume index of 38.1 ml per meters squared BSA.  Moderately dilated left atrium.  Left Ventricle  Mildly dilated left ventricle.  Severe global LV hypokinesis.  Right Atrium  Normal right atrium.  Right Ventricle  Normal right ventricle structure and function.  Pericardial Effusion  No evidence of pericardial effusion.  Miscellaneous  The aorta is within normal limits.  The IVC is normal   EKG:  EKG 08/02/20: NSR, LBBB, lateral q waves  Recent Labs: 08/02/2020: B Natriuretic Peptide 1,145.5 08/16/2020: BUN 17; Creatinine, Ser 0.95; NT-Pro BNP 2,765; Potassium 4.3; Sodium 138 08/24/2020: Hemoglobin 15.1; Platelets 248  Recent Lipid Panel No results found for: CHOL, TRIG, HDL, CHOLHDL, VLDL, LDLCALC, LDLDIRECT   Physical Exam:    VS:  BP 136/68   Pulse (!) 59   Ht 5' 0.5" (1.537 m)   Wt 108 lb 6.4 oz (49.2 kg)   SpO2 97%   BMI 20.82 kg/m     Wt Readings from Last 3 Encounters:  09/06/20 108 lb 6.4 oz (49.2 kg)  08/24/20 108 lb 9.6 oz (49.3 kg)  08/09/20 106 lb (48.1 kg)     GEN:  Well nourished, well developed in no acute distress HEENT: Normal NECK: No JVD; No carotid bruits CARDIAC: RRR, no murmurs, rubs, gallops RESPIRATORY:  Clear to auscultation without rales, wheezing or rhonchi  ABDOMEN: Soft, non-tender, non-distended MUSCULOSKELETAL:  No edema; No deformity  SKIN: Warm and dry NEUROLOGIC:  Alert and oriented x 3 PSYCHIATRIC:  Normal affect   ASSESSMENT:    1. Chronic systolic congestive heart failure (HCC)    PLAN:    In order of problems listed above:  #Acute on chronic systolic heart failure: LVEF 20-25%. Suspected non-ischemic CM with negative myoview in 2017, however, has not had cath. Recent acute decompensation with volume overload and elevated BNP. Currently improved with NYHA class I-II symptoms today. Appears euvolemic. Has  been compliant with all home medications. Declines any invasive work-up or intervention even if this shortens her lifespan. Only wants to proceed with medical management at this time. -Declined RHC/LHC or an invasive work-up event if shortens lifespan; wants only medical management -TTE with LVEF <20%, G1DD -Change lasix to 20mg  daily and watch weights closely -Start spironolactone 12.5mg  daily -Continue metoprolol12.5mg  XL daily -Increase entresto to 49/51mg  BID -Continue farxiga 10mg  daily -Repeat BMET next week -Low Na diet -Daily weights -If EF remains <35% despite GDMT, will discuss CRT-D (has LBBB)--although patient declining any invasive interventions at this time  #Small to moderate pericardial effusion: Noted on  TTE 08/31/19 -Repeat and ensure stable on surveillance TTE in 1 month  #Black Stool: Resolved. Has not yet seen GI. Off ASA. CBC without anemia -Stopped ASA -Follow-up with GI  #HTN: Well controlled. -Continue metop and entresto as above -Repeat BMET as above    Long discussion held with the patient today about her goals of care. She would like to proceed with only medical therapy at this time and avoid invasive measures. She understands this may lead to shortening her lifespan and she understands. Will continue medical management for HF at this time per patient wishes.  Medication Adjustments/Labs and Tests Ordered: Current medicines are reviewed at length with the patient today.  Concerns regarding medicines are outlined above.  Orders Placed This Encounter  Procedures  . Basic metabolic panel  . Magnesium   Meds ordered this encounter  Medications  . furosemide (LASIX) 20 MG tablet    Sig: Take 1 tablet (20 mg total) by mouth daily.    Dispense:  90 tablet    Refill:  3  . spironolactone (ALDACTONE) 25 MG tablet    Sig: Take 0.5 tablets (12.5 mg total) by mouth daily.    Dispense:  45 tablet    Refill:  3  . sacubitril-valsartan (ENTRESTO) 49-51 MG     Sig: Take 1 tablet by mouth 2 (two) times daily.    Dispense:  180 tablet    Refill:  3    Patient Instructions  Medication Instructions:  Your physician has recommended you make the following change in your medication:  Decrease Lasix 20 mg by mouth daily Increase Entresto 49/51 mg by mouth twice a day Start Spirolactrone 12.5 mg by mouth daily  *If you need a refill on your cardiac medications before your next appointment, please call your pharmacy*   Lab Work: BMET and Magnesium in 1 Week If you have labs (blood work) drawn today and your tests are completely normal, you will receive your results only by: Marland Kitchen MyChart Message (if you have MyChart) OR . A paper copy in the mail If you have any lab test that is abnormal or we need to change your treatment, we will call you to review the results.      Follow-Up: At [facility], you and your health needs are our priority.  As part of our continuing mission to provide you with exceptional heart care, we have created designated Provider Care Teams.  These Care Teams include your primary Cardiologist (physician) and Advanced Practice Providers (APPs -  Physician Assistants and Nurse Practitioners) who all work together to provide you with the care you need, when you need it.  We recommend signing up for the patient portal called "MyChart".  Sign up information is provided on this After Visit Summary.  MyChart is used to connect with patients for Virtual Visits (Telemedicine).  Patients are able to view lab/test results, encounter notes, upcoming appointments, etc.  Non-urgent messages can be sent to your provider as well.   To learn more about what you can do with MyChart, go to [email or web address].    Your next appointment:   1 month The format for your next appointment:   In Person  Provider:   You may see [name], MD or one of the following Advanced Practice Providers on your designated Care Team:    [name], PA-C  Robbie Lis, [place]           Signed, [name], MD  09/06/2020 12:43 PM  [facility]

## 2020-09-03 ENCOUNTER — Telehealth: Payer: Self-pay

## 2020-09-03 DIAGNOSIS — I059 Rheumatic mitral valve disease, unspecified: Secondary | ICD-10-CM

## 2020-09-03 NOTE — Telephone Encounter (Signed)
-----   Message from [name], MD sent at 09/03/2020 12:52 PM EST ----- Her echo shows severely reduced LVEF <20%, which is slightly worse than in 2017. There is mild leakiness of the mitral valve. The other valves look okay. She also has a small amount of fluid around the heart which we will monitor with a repeat echo in 1 month to ensure it is stable. I will see her in clinic on 09/06/20 where we will discuss next steps in management. No changes in medications at this time.

## 2020-09-03 NOTE — Telephone Encounter (Signed)
RN called and spoke with patient in regards to echo per Dr. Johney Frame. Echo showed: severely reduced LVEF <20%, which is slightly worse than in 2017. There is mild leakiness of the mitral valve. The other valves look okay. She also has a small amount of fluid around the heart which we will monitor .  Dr. Johney Frame suggested to order an echo in a month, which patient agreed.    Pt  in clinic on 09/06/20,  will discuss next steps in management. No changes in medications at this time.  Pt stated she understood the plan and didn't have any other needs or concerns.  Manuela Schwartz RN

## 2020-09-06 ENCOUNTER — Other Ambulatory Visit: Payer: Self-pay

## 2020-09-06 ENCOUNTER — Encounter: Payer: Self-pay | Admitting: Cardiology

## 2020-09-06 ENCOUNTER — Ambulatory Visit (INDEPENDENT_AMBULATORY_CARE_PROVIDER_SITE_OTHER): Payer: Medicare Other | Admitting: Cardiology

## 2020-09-06 VITALS — BP 136/68 | HR 59 | Ht 60.5 in | Wt 108.4 lb

## 2020-09-06 DIAGNOSIS — R195 Other fecal abnormalities: Secondary | ICD-10-CM | POA: Diagnosis not present

## 2020-09-06 DIAGNOSIS — I313 Pericardial effusion (noninflammatory): Secondary | ICD-10-CM

## 2020-09-06 DIAGNOSIS — I5022 Chronic systolic (congestive) heart failure: Secondary | ICD-10-CM

## 2020-09-06 DIAGNOSIS — I10 Essential (primary) hypertension: Secondary | ICD-10-CM

## 2020-09-06 DIAGNOSIS — I3139 Other pericardial effusion (noninflammatory): Secondary | ICD-10-CM

## 2020-09-06 MED ORDER — FUROSEMIDE 20 MG PO TABS: 20.0000 mg | ORAL_TABLET | Freq: Every day | ORAL | 3 refills | Status: DC

## 2020-09-06 MED ORDER — SPIRONOLACTONE 25 MG PO TABS: 12.5000 mg | ORAL_TABLET | Freq: Every day | ORAL | 3 refills | Status: DC

## 2020-09-06 MED ORDER — ENTRESTO 49-51 MG PO TABS: 1.0000 | ORAL_TABLET | Freq: Two times a day (BID) | ORAL | 3 refills | Status: DC

## 2020-09-06 NOTE — Patient Instructions (Signed)
Medication Instructions:  Your physician has recommended you make the following change in your medication:  Decrease Lasix 20 mg by mouth daily Increase Entresto 49/51 mg by mouth twice a day Start Spirolactrone 12.5 mg by mouth daily  *If you need a refill on your cardiac medications before your next appointment, please call your pharmacy*   Lab Work: BMET and Magnesium in 1 Week If you have labs (blood work) drawn today and your tests are completely normal, you will receive your results only by: Marland Kitchen MyChart Message (if you have MyChart) OR . A paper copy in the mail If you have any lab test that is abnormal or we need to change your treatment, we will call you to review the results.      Follow-Up: At [facility], you and your health needs are our priority.  As part of our continuing mission to provide you with exceptional heart care, we have created designated Provider Care Teams.  These Care Teams include your primary Cardiologist (physician) and Advanced Practice Providers (APPs -  Physician Assistants and Nurse Practitioners) who all work together to provide you with the care you need, when you need it.  We recommend signing up for the patient portal called "MyChart".  Sign up information is provided on this After Visit Summary.  MyChart is used to connect with patients for Virtual Visits (Telemedicine).  Patients are able to view lab/test results, encounter notes, upcoming appointments, etc.  Non-urgent messages can be sent to your provider as well.   To learn more about what you can do with MyChart, go to [email or web address].    Your next appointment:   1 month The format for your next appointment:   In Person  Provider:   You may see [name], MD or one of the following Advanced Practice Providers on your designated Care Team:    [name], PA-C  [place], [place]

## 2020-09-10 ENCOUNTER — Other Ambulatory Visit: Payer: Medicare Other

## 2020-09-13 ENCOUNTER — Other Ambulatory Visit: Payer: Self-pay

## 2020-09-13 ENCOUNTER — Ambulatory Visit (INDEPENDENT_AMBULATORY_CARE_PROVIDER_SITE_OTHER): Payer: Medicare Other | Admitting: Physician Assistant

## 2020-09-13 ENCOUNTER — Other Ambulatory Visit: Payer: Medicare Other | Admitting: *Deleted

## 2020-09-13 ENCOUNTER — Encounter: Payer: Self-pay | Admitting: Physician Assistant

## 2020-09-13 VITALS — BP 110/60 | HR 61 | Ht 60.5 in | Wt 104.0 lb

## 2020-09-13 DIAGNOSIS — I5022 Chronic systolic (congestive) heart failure: Secondary | ICD-10-CM | POA: Diagnosis not present

## 2020-09-13 DIAGNOSIS — R195 Other fecal abnormalities: Secondary | ICD-10-CM

## 2020-09-13 LAB — BASIC METABOLIC PANEL
BUN/Creatinine Ratio: 18 (ref 12–28)
BUN: 17 mg/dL (ref 8–27)
CO2: 25 mmol/L (ref 20–29)
Calcium: 9.7 mg/dL (ref 8.7–10.3)
Chloride: 98 mmol/L (ref 96–106)
Creatinine, Ser: 0.93 mg/dL (ref 0.57–1.00)
Glucose: 61 mg/dL — ABNORMAL LOW (ref 65–99)
Potassium: 4.7 mmol/L (ref 3.5–5.2)
Sodium: 139 mmol/L (ref 134–144)
eGFR: 61 mL/min/{1.73_m2} (ref >59)

## 2020-09-13 LAB — MAGNESIUM: Magnesium: 2.2 mg/dL (ref 1.6–2.3)

## 2020-09-13 NOTE — Progress Notes (Signed)
Subjective:    Patient ID: Sabrina Hernandez, female    DOB: [birth year], 86 y.o.   MRN: [id number]  HPI Sabrina Hernandez is a pleasant 86 year old white female, new to GI today referred by cardiology/Dr. Johney Frame regarding dark stools. Patient says she has not had prior GI evaluation, no prior colonoscopy. She has history of hypertension, and congestive heart failure with recent echo on 08/31/18 22 showing an EF of less than 20%, she also has a small pericardial effusion and plan was for repeat echo/TTE in 1 month. Patient had been seen by cardiology on 09/06/2020.  Apparently on questioning patient had mentioned that she had noted dark stool.  Her baby aspirin has been stopped and she had CBC done that shows hemoglobin of 15.1/hematocrit of 45.4, MCV of 90. On discussion today patient says she has not seen any melena or hematochezia.  She believes she may have seen dark stool for 1 to 2 days over a month ago and has not noted anything at all different over the past month.  She says the stool is not black or tarry.  She had not been using any Pepto-Bismol.  She wonders if it was something she had eaten. She has no current complaints of abdominal pain, says her bowel movements have been normal, no problems with diarrhea or constipation.  She does have history of GERD but is not requiring any medication, denies any heartburn or indigestion, no current complaints of dysphagia. She has not been taking any NSAIDs. Family history is negative for colon cancer as far she is aware. She does use Mylanta on a as needed basis.  Review of Systems Pertinent positive and negative review of systems were noted in the above HPI section.  All other review of systems was otherwise negative.  Outpatient Encounter Medications as of 09/13/2020  Medication Sig  . dapagliflozin propanediol (FARXIGA) 10 MG TABS tablet Take 1 tablet (10 mg total) by mouth daily before breakfast.  . furosemide (LASIX) 20 MG tablet Take 1 tablet (20  mg total) by mouth daily.  . metoprolol succinate (TOPROL XL) 25 MG 24 hr tablet Take 0.5 tablets (12.5 mg total) by mouth at bedtime.  . Multiple Vitamin (MULTIVITAMIN WITH MINERALS) TABS tablet Take 1 tablet by mouth every morning.  . sacubitril-valsartan (ENTRESTO) 49-51 MG Take 1 tablet by mouth 2 (two) times daily.  Marland Kitchen spironolactone (ALDACTONE) 25 MG tablet Take 0.5 tablets (12.5 mg total) by mouth daily.  . [DISCONTINUED] esomeprazole (NEXIUM) 40 MG capsule Take 1 capsule (40 mg total) by mouth 2 (two) times daily before a meal. (Patient not taking: Reported on 09/13/2020)   No facility-administered encounter medications on file as of 09/13/2020.   No Known Allergies Patient Active Problem List   Diagnosis Date Noted  . Reactive airway disease 08/04/2018  . Cough 08/04/2018  . History of CHF (congestive heart failure) 08/04/2018  . Dental abscess 11/04/2016  . Abnormal liver enzymes 05/05/2016  . Leg edema, right 10/30/2015  . Microhematuria 10/30/2015  . Chronic systolic congestive heart failure ([place]) 10/03/2015  . Lip ulcer 06/28/2015  . Essential hypertension 04/30/2015  . Gastroesophageal reflux disease without esophagitis 04/30/2015  . Left patella fracture 01/29/2015   Social History   Socioeconomic History  . Marital status: Married    Spouse name: Herbie Baltimore  . Number of children: 5  . Years of education: Not on file  . Highest education level: Not on file  Occupational History  . Occupation: Teacher/instructor-retired    Employer: [employer]  Tobacco Use  . Smoking status: Former Research scientist (life sciences)  . Smokeless tobacco: Never Used  Vaping Use  . Vaping Use: Never used  Substance and Sexual Activity  . Alcohol use: Yes    Comment: social   . Drug use: No  . Sexual activity: Not on file  Other Topics Concern  . Not on file  Social History Narrative   She is from [place], [place] originally but has lived in [place] for 30 years. She would like to move back. She is  married with 5 grown children (3 in [place] and 2 in [place]. She is a retired Psychologist, prison and probation services.   Social Determinants of Health   Financial Resource Strain: Not on file  Food Insecurity: Not on file  Transportation Needs: Not on file  Physical Activity: Not on file  Stress: Not on file  Social Connections: Not on file  Intimate Partner Violence: Not on file    Sabrina Hernandez family history includes Alcohol abuse in her father; Early death in her mother; Heart failure in her mother; Mental illness in her father; Stroke in her father.      Objective:    Vitals:   09/13/20 1523  BP: 110/60  Pulse: 61    Physical Exam Well-developed well-nourished eld WF in no acute distress. Pleasant  Height, Weight, BMI 19.9  HEENT; nontraumatic normocephalic, EOMI, PE R LA, sclera anicteric. Oropharynx; not examined today Neck; supple, no JVD Cardiovascular; regular rate and rhythm with C3-J6, positive systolic murmur Pulmonary; Clear bilaterally Abdomen; soft, nontender, nondistended, no palpable mass or hepatosplenomegaly, bowel sounds are active Rectal; brown Hemoccult negative stool,small external hemorrhoid Skin; benign exam, no jaundice rash or appreciable lesions Extremities; no clubbing cyanosis or edema skin warm and dry Neuro/Psych; alert and oriented x4, grossly nonfocal mood and affect appropriate       Assessment & Plan:   #25 86 year old white female with report of a darker stool which occurred over 1 month ago.  Bowel movements have been normal appearing over the past month.  Patient says that that stool was not black or tarry. She has no current GI complaints, most recent CBC hemoglobin 15.1 with normal MCV. Stool is brown and Hemoccult negative today  At this time I do not think she has had any significant GI event  #2 congestive heart failure, severe with EF less than 20% and small pericardial effusion on most recent echo. #3 hypertension  Plan; I do not think patient  needs to have any further GI work-up at this time.  She is high risk for complications with endoscopic evaluation given severe congestive heart failure. I have asked her to stay off baby aspirin for now but would be okay to resume if cardiology feels indicated Patient will watch bowel movements and knows to contact us should she develop any melena or hematochezia. Patient will be established with Dr. Rush Landmark.  Tamarah Bhullar S Ranay Ketter PA-C 09/13/2020   Cc: Ronnald Nian, DO

## 2020-09-13 NOTE — Progress Notes (Signed)
Attending Physician's Attestation   I have reviewed the chart.   I agree with the Advanced Practitioner's note, impression, and recommendations with any updates as below.    Davinci Glotfelty Mansouraty, MD [facility] Advanced Endoscopy Office # [phone]  

## 2020-09-13 NOTE — Patient Instructions (Signed)
If you are age 84 or older, your body mass index should be between 23-30. Your Body mass index is 19.98 kg/m. If this is out of the aforementioned range listed, please consider follow up with your Primary Care Provider.  If you are age 36 or younger, your body mass index should be between 19-25. Your Body mass index is 19.98 kg/m. If this is out of the aformentioned range listed, please consider follow up with your Primary Care Provider.   Stay off of Asprin until Cardiology tells you otherwise.   Follow up as needed with Nicoletta Ba, PA-C or Dr. Rush Landmark.  Thank you for entrusting me with your care and choosing [facility].  Amy Esterwood, PA-C

## 2020-10-01 ENCOUNTER — Other Ambulatory Visit (HOSPITAL_COMMUNITY): Payer: Self-pay | Admitting: Cardiovascular Disease

## 2020-10-01 ENCOUNTER — Other Ambulatory Visit: Payer: Self-pay

## 2020-10-01 ENCOUNTER — Ambulatory Visit (HOSPITAL_COMMUNITY): Payer: Medicare Other | Attending: Internal Medicine

## 2020-10-01 DIAGNOSIS — I059 Rheumatic mitral valve disease, unspecified: Secondary | ICD-10-CM | POA: Diagnosis not present

## 2020-10-01 DIAGNOSIS — I313 Pericardial effusion (noninflammatory): Secondary | ICD-10-CM | POA: Diagnosis not present

## 2020-10-01 DIAGNOSIS — I3139 Other pericardial effusion (noninflammatory): Secondary | ICD-10-CM

## 2020-10-01 LAB — ECHOCARDIOGRAM LIMITED: S' Lateral: 4.7 cm

## 2020-10-01 NOTE — Progress Notes (Signed)
Patient ID: Sabrina Hernandez, female   DOB: 04-20-1935, 86 y.o.   MRN: [id number]  Prior to beginning the study, [name] expressed that she was very upset. She stated that she had an echo on 08/30/2020, that her insurance would not cover another echo this soon and that we were pumping poison into her body. I consulted with (DOD), Dr. Angelena Form, as Dr. Johney Frame was not available at [facility] location. Per Dr. Angelena Form, today's echo was ordered to re-assess pericardial effusion and if agreed upon by the patient, it is okay to perform a very limited echo without the administration of Definity.

## 2020-10-13 NOTE — Progress Notes (Deleted)
Cardiology Office Note:    Date:  10/13/2020   ID:  Sabrina Hernandez, DOB Dec 13, 1934, MRN [id number]  PCP:  Ronnald Nian, DO   [place]  Cardiologist:  [name], MD  Advanced Practice Provider:  No care team member to display Electrophysiologist:  None    Referring MD: Ronnald Nian, DO     History of Present Illness:    Sabrina Hernandez is a 86 y.o. female with a hx of HTN, GERD and systolic heart failure who presents to clinic for follow-up of her HFrEF.  She was initiallydiagnosed with HFrEF in 2017when she presented with worsening SOB. Was seen at [facility]. Declined cath at that time but myoview was negative for ischemia.She was initially on metop/ACE but these were later stopped due to fatigue. She has not followed regularly with a cardiologist. Last TTE was in 2017. No ICD in place.  Was seen in [place] 08/02/20 with worsening DOE over the past couple of months as well as orthopnea. In the ED, ECG with NSR, LAE, LBBB. LVH. TTE with LVEF 20-25% in2017. BNP 1145. Trop 49>>53. COVID negative. She was diuresed with lasix IV with good response. She was discharged home with plans to follow-up with Cardiology for further management.  During our visit 08/09/20 the patient was doing better but continued to have SOB and orthopnea. BNP 4251. We resumed diuresis with lasix 20mg  BID.  During our visit on 08/24/20, the patient reported having black stools. On ASA but not AC. We stopped her ASA and referred to GI. CBC with HgB 15.1.  TTE on 08/29/20 with LVEF <20% with global hypokinesis. G1DD, mild MR, small to moderate pericardial effusion.  During her last visit on 09/06/20, the patient was doing very well. She resumed walking with no chest pain, orthopnea, LE edema, lightheadedness or syncope.  We discussed that given her LVEF<20% a LHC/RHC would be helpful to discern if she has significant CAD that may be causing the reduced pumping  function. The patient stated she would not like to pursue any invasive testing at all. She only wants to pursue medical management at this time even if that means that this may shorten her lifespan.  Past Medical History:  Diagnosis Date  . CHF (congestive heart failure) ([place])   . Diverticulitis   . Shortness of breath dyspnea    with exertion     Past Surgical History:  Procedure Laterality Date  . CERVICAL CONE BIOPSY    . HAMMER TOE SURGERY     right foot   . ORIF PATELLA Left 01/29/2015   Procedure: OPEN REDUCTION INTERNAL (ORIF) FIXATION LEFT PATELLA;  Surgeon: Paralee Cancel, MD;  Location: [facility];  Service: Orthopedics;  Laterality: Left;    Current Medications: No outpatient medications have been marked as taking for the 10/19/20 encounter (Appointment) with [name], MD.     Allergies:   Patient has no known allergies.   Social History   Socioeconomic History  . Marital status: Married    Spouse name: Herbie Baltimore  . Number of children: 5  . Years of education: Not on file  . Highest education level: Not on file  Occupational History  . Occupation: Teacher/instructor-retired    Employer: [employer]  Tobacco Use  . Smoking status: Former Research scientist (life sciences)  . Smokeless tobacco: Never Used  Vaping Use  . Vaping Use: Never used  Substance and Sexual Activity  . Alcohol use: Yes  Comment: social   . Drug use: No  . Sexual activity: Not on file  Other Topics Concern  . Not on file  Social History Narrative   She is from [place], [place] originally but has lived in [place] for 30 years. She would like to move back. She is married with 5 grown children (3 in [place] and 2 in [place]. She is a retired Psychologist, prison and probation services.   Social Determinants of Health   Financial Resource Strain: Not on file  Food Insecurity: Not on file  Transportation Needs: Not on file  Physical Activity: Not on file  Stress: Not on file  Social Connections: Not on file     Family History: The  patient's family history includes Alcohol abuse in her father; Early death in her mother; Heart failure in her mother; Mental illness in her father; Stroke in her father.  ROS:   Please see the history of present illness.    Review of Systems  Constitutional: Negative for chills and fever.  HENT: Negative for hearing loss.   Eyes: Negative for blurred vision and redness.  Respiratory: Negative for shortness of breath.   Cardiovascular: Negative for chest pain, palpitations, orthopnea, claudication, leg swelling and PND.  Gastrointestinal: Negative for melena, nausea and vomiting.  Genitourinary: Negative for dysuria.  Musculoskeletal: Positive for joint pain. Negative for falls.  Neurological: Negative for dizziness and loss of consciousness.  Endo/Heme/Allergies: Negative for polydipsia.  Psychiatric/Behavioral: Negative for substance abuse.    EKGs/Labs/Other Studies Reviewed:    The following studies were reviewed today: TTE 09-22-2020: 1. Left ventricular ejection fraction, by estimation, is <20%. The left  ventricle has severely decreased function. The left ventricle demonstrates  global hypokinesis. The left ventricular internal cavity size was mildly  dilated. Left ventricular  diastolic parameters are consistent with Grade I diastolic dysfunction  (impaired relaxation). Elevated left ventricular end-diastolic pressure.  2. Right ventricular systolic function is normal. The right ventricular  size is normal. Tricuspid regurgitation signal is inadequate for assessing  PA pressure.  3. The mitral valve is degenerative. Mild mitral valve regurgitation. No  evidence of mitral stenosis.  4. The aortic valve is tricuspid. Aortic valve regurgitation is not  visualized. Mild aortic valve sclerosis is present, with no evidence of  aortic valve stenosis.  5. The inferior vena cava is normal in size with greater than 50%  respiratory variability, suggesting right atrial pressure  of 3 mmHg.  6. A small to moderate pericardial effusion is present. The pericardial  effusion is circumferential with no evidence of pericardial tamponade.  Myoview 2017: 1. No clear evidence for reversibility or ischemia..   2. Dilated left ventricle with diffuse hypokinesia and paradoxical  motion along the septal wall.   3. Left ventricular ejection fraction is 28%.   4. High-risk stress test findings*.   TTE 2017: Findings  Mitral Valve  Structurally normal mitral valve.  Mild to moderate mitral regurgitation by color flow doppler examination.  Aortic Valve  Structurally normal aortic valve with good leaflet mobility, and no  regurgitation by color flow doppler examination.  Tricuspid Valve  Tricuspid valve is structurally normal. Trace tricuspid regurgitation by color  flow doppler examination. Normal estimated pulmonary artery pressure  Pulmonic Valve  Pulmonic valve is structurally normal.  Mild pulmonic regurgitation present by color flow doppler examination.  No evidence of pulmonic stenosis.  Left Atrium  Left atrial volume index of 38.1 ml per meters squared BSA.  Moderately dilated left atrium.  Left Ventricle  Mildly dilated left ventricle.  Severe global LV hypokinesis.  Right Atrium  Normal right atrium.  Right Ventricle  Normal right ventricle structure and function.  Pericardial Effusion  No evidence of pericardial effusion.  Miscellaneous  The aorta is within normal limits.  The IVC is normal   EKG:  EKG 08/02/20: NSR, LBBB, lateral q waves  Recent Labs: 08/02/2020: B Natriuretic Peptide 1,145.5 08/16/2020: NT-Pro BNP 2,765 08/24/2020: Hemoglobin 15.1; Platelets 248 09/13/2020: BUN 17; Creatinine, Ser 0.93; Magnesium 2.2; Potassium 4.7; Sodium 139  Recent Lipid Panel No results found for: CHOL, TRIG, HDL, CHOLHDL, VLDL, LDLCALC, LDLDIRECT   Physical Exam:    VS:  There were no vitals taken for this visit.    Wt Readings from Last 3  Encounters:  09/13/20 104 lb (47.2 kg)  09/06/20 108 lb 6.4 oz (49.2 kg)  08/24/20 108 lb 9.6 oz (49.3 kg)     GEN:  Well nourished, well developed in no acute distress HEENT: Normal NECK: No JVD; No carotid bruits CARDIAC: RRR, no murmurs, rubs, gallops RESPIRATORY:  Clear to auscultation without rales, wheezing or rhonchi  ABDOMEN: Soft, non-tender, non-distended MUSCULOSKELETAL:  No edema; No deformity  SKIN: Warm and dry NEUROLOGIC:  Alert and oriented x 3 PSYCHIATRIC:  Normal affect   ASSESSMENT:    No diagnosis found. PLAN:    In order of problems listed above:  #Acute on chronic systolic heart failure: LVEF 20-25%. Suspected non-ischemic CM with negative myoview in 2017, however, has not had cath. Recent acute decompensation with volume overload and elevated BNP. Currently improved with NYHA class I-II symptoms today. Appears euvolemic. Has been compliant with all home medications. Declines any invasive work-up or intervention even if this shortens her lifespan. Only wants to proceed with medical management at this time. -Declined RHC/LHC or an invasive work-up event if shortens lifespan; wants only medical management -TTE with LVEF <20%, G1DD -Continue lasix to 20mg  daily and watch weights closely -Continue spironolactone 12.5mg  daily -Continue metoprolol12.5mg  XL daily -Continue entresto to 49/51mg  BID -Continue farxiga 10mg  daily -Low Na diet -Daily weights -If EF remains <35% despite GDMT, will discuss CRT-D (has LBBB)--although patient declining any invasive interventions at this time  #Small to moderate pericardial effusion: Improved on TTE 10/01/20 measuring 86mm -Continue to monitor; overall stable on repeat TT  #Black Stool: Resolved. Has not yet seen GI. Off ASA. CBC without anemia -Stopped ASA -Follow-up with GI  #HTN: Well controlled. -Continue metop and entresto as above -Repeat BMET as above     Medication Adjustments/Labs and Tests  Ordered: Current medicines are reviewed at length with the patient today.  Concerns regarding medicines are outlined above.  No orders of the defined types were placed in this encounter.  No orders of the defined types were placed in this encounter.   There are no Patient Instructions on file for this visit.   Signed, [name], MD  10/13/2020 9:02 PM    [place]

## 2020-10-19 ENCOUNTER — Ambulatory Visit: Payer: Medicare Other | Admitting: Cardiology

## 2020-10-19 DIAGNOSIS — Z23 Encounter for immunization: Secondary | ICD-10-CM | POA: Diagnosis not present

## 2021-02-19 ENCOUNTER — Other Ambulatory Visit: Payer: Self-pay | Admitting: *Deleted

## 2021-02-19 MED ORDER — DAPAGLIFLOZIN PROPANEDIOL 10 MG PO TABS: 10.0000 mg | ORAL_TABLET | Freq: Every day | ORAL | 1 refills | Status: DC

## 2021-04-29 DIAGNOSIS — H25043 Posterior subcapsular polar age-related cataract, bilateral: Secondary | ICD-10-CM | POA: Diagnosis not present

## 2021-04-29 DIAGNOSIS — H2513 Age-related nuclear cataract, bilateral: Secondary | ICD-10-CM | POA: Diagnosis not present

## 2021-04-29 DIAGNOSIS — H04123 Dry eye syndrome of bilateral lacrimal glands: Secondary | ICD-10-CM | POA: Diagnosis not present

## 2021-05-16 DIAGNOSIS — L814 Other melanin hyperpigmentation: Secondary | ICD-10-CM | POA: Diagnosis not present

## 2021-05-16 DIAGNOSIS — L570 Actinic keratosis: Secondary | ICD-10-CM | POA: Diagnosis not present

## 2021-05-16 DIAGNOSIS — L821 Other seborrheic keratosis: Secondary | ICD-10-CM | POA: Diagnosis not present

## 2021-05-16 DIAGNOSIS — Z85828 Personal history of other malignant neoplasm of skin: Secondary | ICD-10-CM | POA: Diagnosis not present

## 2021-06-03 DIAGNOSIS — Z23 Encounter for immunization: Secondary | ICD-10-CM | POA: Diagnosis not present

## 2021-06-11 DIAGNOSIS — Z85828 Personal history of other malignant neoplasm of skin: Secondary | ICD-10-CM | POA: Diagnosis not present

## 2021-06-11 DIAGNOSIS — L308 Other specified dermatitis: Secondary | ICD-10-CM | POA: Diagnosis not present

## 2021-07-18 DIAGNOSIS — Z23 Encounter for immunization: Secondary | ICD-10-CM | POA: Diagnosis not present

## 2021-07-29 DIAGNOSIS — H25813 Combined forms of age-related cataract, bilateral: Secondary | ICD-10-CM | POA: Diagnosis not present

## 2021-07-29 DIAGNOSIS — H04123 Dry eye syndrome of bilateral lacrimal glands: Secondary | ICD-10-CM | POA: Diagnosis not present

## 2021-07-29 DIAGNOSIS — H0102B Squamous blepharitis left eye, upper and lower eyelids: Secondary | ICD-10-CM | POA: Diagnosis not present

## 2021-07-29 DIAGNOSIS — H0102A Squamous blepharitis right eye, upper and lower eyelids: Secondary | ICD-10-CM | POA: Diagnosis not present

## 2021-07-30 ENCOUNTER — Emergency Department (HOSPITAL_BASED_OUTPATIENT_CLINIC_OR_DEPARTMENT_OTHER)
Admission: EM | Admit: 2021-07-30 | Discharge: 2021-07-31 | Disposition: A | Discharge status: Home / Self Care | Payer: Medicare Other | Attending: Emergency Medicine | Admitting: Emergency Medicine

## 2021-07-30 ENCOUNTER — Other Ambulatory Visit: Payer: Self-pay

## 2021-07-30 ENCOUNTER — Encounter (HOSPITAL_BASED_OUTPATIENT_CLINIC_OR_DEPARTMENT_OTHER): Payer: Self-pay | Admitting: *Deleted

## 2021-07-30 ENCOUNTER — Emergency Department (HOSPITAL_BASED_OUTPATIENT_CLINIC_OR_DEPARTMENT_OTHER): Payer: Medicare Other

## 2021-07-30 DIAGNOSIS — J90 Pleural effusion, not elsewhere classified: Secondary | ICD-10-CM | POA: Diagnosis not present

## 2021-07-30 DIAGNOSIS — R7989 Other specified abnormal findings of blood chemistry: Secondary | ICD-10-CM | POA: Diagnosis not present

## 2021-07-30 DIAGNOSIS — I5020 Unspecified systolic (congestive) heart failure: Secondary | ICD-10-CM | POA: Diagnosis present

## 2021-07-30 DIAGNOSIS — R000 Tachycardia, unspecified: Secondary | ICD-10-CM | POA: Diagnosis not present

## 2021-07-30 DIAGNOSIS — R059 Cough, unspecified: Secondary | ICD-10-CM | POA: Diagnosis not present

## 2021-07-30 DIAGNOSIS — Z20822 Contact with and (suspected) exposure to covid-19: Secondary | ICD-10-CM | POA: Diagnosis not present

## 2021-07-30 DIAGNOSIS — I509 Heart failure, unspecified: Secondary | ICD-10-CM | POA: Diagnosis not present

## 2021-07-30 DIAGNOSIS — I5023 Acute on chronic systolic (congestive) heart failure: Secondary | ICD-10-CM | POA: Diagnosis present

## 2021-07-30 DIAGNOSIS — R0602 Shortness of breath: Secondary | ICD-10-CM | POA: Insufficient documentation

## 2021-07-30 DIAGNOSIS — J9811 Atelectasis: Secondary | ICD-10-CM | POA: Diagnosis not present

## 2021-07-30 DIAGNOSIS — R790 Abnormal level of blood mineral: Secondary | ICD-10-CM | POA: Diagnosis not present

## 2021-07-30 DIAGNOSIS — R0601 Orthopnea: Secondary | ICD-10-CM | POA: Insufficient documentation

## 2021-07-30 HISTORY — DX: Unspecified systolic (congestive) heart failure: I50.20

## 2021-07-30 LAB — CBC WITH DIFFERENTIAL/PLATELET
Abs Immature Granulocytes: 0.01 10*3/uL (ref 0.00–0.07)
Basophils Absolute: 0.1 10*3/uL (ref 0.0–0.1)
Basophils Relative: 1 %
Eosinophils Absolute: 0.1 10*3/uL (ref 0.0–0.5)
Eosinophils Relative: 2 %
HCT: 41.8 % (ref 36.0–46.0)
Hemoglobin: 14 g/dL (ref 12.0–15.0)
Immature Granulocytes: 0 %
Lymphocytes Relative: 29 %
Lymphs Abs: 1.4 10*3/uL (ref 0.7–4.0)
MCH: 29.5 pg (ref 26.0–34.0)
MCHC: 33.5 g/dL (ref 30.0–36.0)
MCV: 88.2 fL (ref 80.0–100.0)
Monocytes Absolute: 0.4 10*3/uL (ref 0.1–1.0)
Monocytes Relative: 8 %
Neutro Abs: 2.8 10*3/uL (ref 1.7–7.7)
Neutrophils Relative %: 60 %
Platelets: 190 10*3/uL (ref 150–400)
RBC: 4.74 MIL/uL (ref 3.87–5.11)
RDW: 14.1 % (ref 11.5–15.5)
WBC: 4.7 10*3/uL (ref 4.0–10.5)
nRBC: 0 % (ref 0.0–0.2)

## 2021-07-30 LAB — RESP PANEL BY RT-PCR (FLU A&B, COVID) ARPGX2
Influenza A by PCR: NEGATIVE
Influenza B by PCR: NEGATIVE
SARS Coronavirus 2 by RT PCR: NEGATIVE

## 2021-07-30 LAB — COMPREHENSIVE METABOLIC PANEL
ALT: 32 U/L (ref 0–44)
AST: 36 U/L (ref 15–41)
Albumin: 3.8 g/dL (ref 3.5–5.0)
Alkaline Phosphatase: 85 U/L (ref 38–126)
Anion gap: 11 (ref 5–15)
BUN: 13 mg/dL (ref 8–23)
CO2: 22 mmol/L (ref 22–32)
Calcium: 8.8 mg/dL — ABNORMAL LOW (ref 8.9–10.3)
Chloride: 103 mmol/L (ref 98–111)
Creatinine, Ser: 0.74 mg/dL (ref 0.44–1.00)
GFR, Estimated: >60 mL/min (ref >60)
Glucose, Bld: 91 mg/dL (ref 70–99)
Potassium: 3.7 mmol/L (ref 3.5–5.1)
Sodium: 136 mmol/L (ref 135–145)
Total Bilirubin: 1.9 mg/dL — ABNORMAL HIGH (ref 0.3–1.2)
Total Protein: 6.9 g/dL (ref 6.5–8.1)

## 2021-07-30 LAB — TROPONIN I (HIGH SENSITIVITY)
Troponin I (High Sensitivity): 52 ng/L — ABNORMAL HIGH (ref <18)
Troponin I (High Sensitivity): 64 ng/L — ABNORMAL HIGH (ref <18)

## 2021-07-30 LAB — BRAIN NATRIURETIC PEPTIDE: B Natriuretic Peptide: 1277.6 pg/mL — ABNORMAL HIGH (ref 0.0–100.0)

## 2021-07-30 MED ORDER — ADULT MULTIVITAMIN W/MINERALS CH: 1.0000 | ORAL_TABLET | Freq: Every morning | ORAL | Status: DC

## 2021-07-30 MED ORDER — SPIRONOLACTONE 12.5 MG HALF TABLET
12.5000 mg | ORAL_TABLET | Freq: Every day | ORAL | Status: DC
  Filled 2021-07-30: qty 1

## 2021-07-30 MED ORDER — DAPAGLIFLOZIN PROPANEDIOL 10 MG PO TABS
10.0000 mg | ORAL_TABLET | Freq: Every day | ORAL | Status: DC
  Filled 2021-07-30: qty 1

## 2021-07-30 MED ORDER — FUROSEMIDE 10 MG/ML IJ SOLN
40.0000 mg | Freq: Once | INTRAMUSCULAR | Status: AC
  Administered 2021-07-30: 40 mg via INTRAVENOUS
  Filled 2021-07-30: qty 4

## 2021-07-30 MED ORDER — METOPROLOL SUCCINATE ER 25 MG PO TB24: 12.5000 mg | ORAL_TABLET | Freq: Every day | ORAL | Status: DC

## 2021-07-30 MED ORDER — FUROSEMIDE 10 MG/ML IJ SOLN: 20.0000 mg | Freq: Every day | INTRAMUSCULAR | Status: DC

## 2021-07-30 MED ORDER — SACUBITRIL-VALSARTAN 49-51 MG PO TABS
1.0000 | ORAL_TABLET | Freq: Two times a day (BID) | ORAL | Status: DC
  Filled 2021-07-30: qty 1

## 2021-07-30 NOTE — ED Provider Notes (Signed)
[facility] Provider Note   CSN: [id number] Arrival date & time: 07/30/21  1649     History  Chief Complaint  Patient presents with   Shortness of Breath    Sabrina Hernandez is a 87 y.o. female with history of CHF with ejection fraction less than 20% on echocardiogram in April 2022 who presents for a few days of shortness of breath, orthopnea, and sensation that her heart is racing both at rest and with exertion.  Patient is supposed to be on Lasix 20 mg daily but states that she does not take it anymore because she did not like the way it made her feel.  She also endorses dry cough but denies any fever or other infectious symptoms.  I personally reviewed this patient's medical records.  She has history of CHF exacerbation 1 year ago in February 2022 at which time she also had stopped taking her Lasix and refused admission to the hospital for heart failure exacerbation.  She is not anticoagulated.  HPI     Home Medications Prior to Admission medications   Medication Sig Start Date End Date Taking? Authorizing Provider  dapagliflozin propanediol (FARXIGA) 10 MG TABS tablet Take 1 tablet (10 mg total) by mouth daily before breakfast. 02/19/21   [name], MD  furosemide (LASIX) 20 MG tablet Take 1 tablet (20 mg total) by mouth daily. 09/06/20   [name], MD  metoprolol succinate (TOPROL XL) 25 MG 24 hr tablet Take 0.5 tablets (12.5 mg total) by mouth at bedtime. 08/09/20   [name], MD  Multiple Vitamin (MULTIVITAMIN WITH MINERALS) TABS tablet Take 1 tablet by mouth every morning.    [provider]  sacubitril-valsartan (ENTRESTO) 49-51 MG Take 1 tablet by mouth 2 (two) times daily. 09/06/20   [name], MD  spironolactone (ALDACTONE) 25 MG tablet Take 0.5 tablets (12.5 mg total) by mouth daily. 09/06/20   [name], MD  potassium chloride SA (KLOR-CON) 20 MEQ tablet Take 1 tablet (20 mEq  total) by mouth daily for 4 doses. 08/02/20 08/09/20  Tedd Sias, PA      Allergies    Patient has no known allergies.    Review of Systems   Review of Systems  Constitutional:  Positive for activity change, appetite change and fatigue. Negative for fever.  HENT: Negative.    Respiratory:  Positive for cough and shortness of breath.   Cardiovascular:  Positive for palpitations. Negative for chest pain.  Gastrointestinal: Negative.   Genitourinary: Negative.   Musculoskeletal: Negative.   Skin: Negative.   Neurological: Negative.   Hematological: Negative.    Physical Exam Updated Vital Signs BP (!) 151/79    Pulse (!) 112    Temp 99.2 F (37.3 C) (Oral)    Resp (!) 22    Ht 5' 0.5" (1.537 m)    Wt 50.8 kg    SpO2 97%    BMI 21.51 kg/m  Physical Exam Vitals and nursing note reviewed.  Constitutional:      Appearance: She is not ill-appearing or toxic-appearing.  HENT:     Head: Normocephalic and atraumatic.     Nose: Nose normal.     Mouth/Throat:     Mouth: Mucous membranes are moist.     Pharynx: Oropharynx is clear. Uvula midline. No oropharyngeal exudate or posterior oropharyngeal erythema.     Tonsils: No tonsillar exudate.  Eyes:     General: Lids are normal. Vision grossly  intact.        Right eye: No discharge.        Left eye: No discharge.     Extraocular Movements: Extraocular movements intact.     Conjunctiva/sclera: Conjunctivae normal.     Pupils: Pupils are equal, round, and reactive to light.  Neck:     Trachea: Trachea and phonation normal.  Cardiovascular:     Rate and Rhythm: Regular rhythm. Tachycardia present.     Pulses: Normal pulses.     Heart sounds: Normal heart sounds. No murmur heard. Pulmonary:     Effort: Pulmonary effort is normal. Tachypnea present. No bradypnea, accessory muscle usage, prolonged expiration, respiratory distress or retractions.     Breath sounds: Examination of the left-lower field reveals rales. Rales present. No  wheezing.  Chest:     Chest wall: No mass, lacerations, deformity, swelling, tenderness, crepitus or edema.  Abdominal:     General: Bowel sounds are normal. There is no distension.     Palpations: Abdomen is soft.     Tenderness: There is no abdominal tenderness. There is no right CVA tenderness, left CVA tenderness, guarding or rebound.  Musculoskeletal:        General: No deformity.     Cervical back: Normal range of motion and neck supple.     Right lower leg: No edema.     Left lower leg: No edema.  Lymphadenopathy:     Cervical: No cervical adenopathy.  Skin:    General: Skin is warm and dry.     Capillary Refill: Capillary refill takes less than 2 seconds.  Neurological:     General: No focal deficit present.     Mental Status: She is alert and oriented to person, place, and time. Mental status is at baseline.  Psychiatric:        Mood and Affect: Mood normal.    ED Results / Procedures / Treatments   Labs (all labs ordered are listed, but only abnormal results are displayed) Labs Reviewed  COMPREHENSIVE METABOLIC PANEL - Abnormal; Notable for the following components:      Result Value   Calcium 8.8 (*)    Total Bilirubin 1.9 (*)    All other components within normal limits  BRAIN NATRIURETIC PEPTIDE - Abnormal; Notable for the following components:   B Natriuretic Peptide 1,277.6 (*)    All other components within normal limits  TROPONIN I (HIGH SENSITIVITY) - Abnormal; Notable for the following components:   Troponin I (High Sensitivity) 52 (*)    All other components within normal limits  TROPONIN I (HIGH SENSITIVITY) - Abnormal; Notable for the following components:   Troponin I (High Sensitivity) 64 (*)    All other components within normal limits  RESP PANEL BY RT-PCR (FLU A&B, COVID) ARPGX2  CBC WITH DIFFERENTIAL/PLATELET    EKG EKG Interpretation  Date/Time:  Tuesday July 30 2021 17:14:55 EST Ventricular Rate:  115 PR Interval:  164 QRS  Duration: 136 QT Interval:  354 QTC Calculation: 489 R Axis:   206 Text Interpretation: Sinus tachycardia with Premature atrial complexes Right superior axis deviation Left ventricular hypertrophy with QRS widening and repolarization abnormality ( Cornell product )  rate is faster compared to Feb 2022 Confirmed by Sherwood Gambler [phone]) on 07/30/2021 5:19:57 PM  Radiology DG Chest 2 View  Result Date: 07/30/2021 CLINICAL DATA:  Short of breath.  History of CHF EXAM: CHEST - 2 VIEW COMPARISON:  08/02/2020 FINDINGS: Cardiac enlargement. Mild vascular congestion without edema.  Small bilateral effusions. Mild bibasilar atelectasis. No definite pneumonia. Apical scarring right greater than left. IMPRESSION: Cardiac enlargement mild vascular congestion and small effusions. Negative for edema Mild bibasilar airspace disease most likely atelectasis. Electronically Signed   By: Franchot Gallo M.D.   On: 07/30/2021 17:59    Procedures Procedures    Medications Ordered in ED Medications  furosemide (LASIX) injection 40 mg (40 mg Intravenous Given 07/30/21 2009)    ED Course/ Medical Decision Making/ A&P Clinical Course as of 07/30/21 2031  Tue Jul 30, 2021  2017 Consulted hospitalist, Dr. Jean Rosenthal, who is agreeable to admitting this patient to his service.  Appreciate his collaboration in the care of this patient. [RS]    Clinical Course User Index [RS] Henley Blyth, Gypsy Balsam, PA-C                           Medical Decision Making 87 year old female with history of CHF who presents with concern for progressively worsening shortness of breath for the last few days.  Differential diagnosis includes but is limited to CHF exacerbation, PE, pleural effusion, pneumothorax, pneumonia, ACS, dysrhythmia.  Hypertensive on intake, Oxygen saturation normal on room air Tachycardic and tachypneic, afebrile. Cardiopulmonary exam as above with tachycardia, tachypnea, and rales.  Concerning for CHF exacerbation.   No lower extremity edema.   Amount and/or Complexity of Data Reviewed Labs: ordered.    Details: CBC unremarkable, CMP with elevated total bilirubin 1.9.  Troponin elevated to 52, delta troponin elevated to 64.  BNP elevated to 1277. Radiology: ordered.    Details: Chest x-ray with small effusions bilaterally, cardiac enlargement, and mild vascular congestion.  Bibasilar atelectasis. Case discussed with hospitalist above. Discussion of management or test interpretation with external provider(s): Discussed with hospitalist as above.   Risk Prescription drug management. Decision regarding hospitalization.   Given findings consistent with CHF exacerbation in context of tachycardia and tachypnea and poor compliance with diuresis at home, I do feel this patient would benefit from admission to the hospital.  Patient was initially quite resistant as her husband cannot spend the night with her, however after she was notified that she will have access to TV and something to drink she was much more amenable to admission.  She has been administered IV Lasix and is resting comfortably in her bed at this time.  She remains mildly hypertensive without hypoxia.  Jalissa voiced understanding for medical evaluation and treatment plan.  Each of her questions was answered to her expressed satisfaction.  She is amenable to plan for admission at this time.  This chart was dictated using voice recognition software, Dragon. Despite the best efforts of this provider to proofread and correct errors, errors may still occur which can change documentation meaning.  Final Clinical Impression(s) / ED Diagnoses Final diagnoses:  None    Rx / DC Orders ED Discharge Orders     None         Aura Dials 07/30/21 2031    Sherwood Gambler, MD 07/30/21 [phone]

## 2021-07-30 NOTE — ED Triage Notes (Signed)
C/o increased SOB x 1 day HX CHF

## 2021-07-31 MED ORDER — FUROSEMIDE 20 MG PO TABS: ORAL_TABLET | ORAL | 0 refills | Status: DC

## 2021-07-31 NOTE — ED Notes (Signed)
Pt came out of room and said she wanted to be discharged with a prescription. Talked to Dr. Karle Starch, EDP who advised walking pt and getting O2 sat. Pt ambulated with no difficulty and maintained SpO2 in high 90s on room air. EDP made aware.

## 2021-07-31 NOTE — ED Provider Notes (Signed)
Care of the patient assumed at the change of shift. Patient with known HFrEF non-compliant with diuresis here with increased SOB, given IV lasix and plan for admission.  Physical Exam  BP 128/76    Pulse 85    Temp 99.2 F (37.3 C) (Oral)    Resp 17    Ht 5' 0.5" (1.537 m)    Wt 50.8 kg    SpO2 97%    BMI 21.51 kg/m   Physical Exam  Procedures  Procedures  ED Course / MDM   Clinical Course as of 07/31/21 0857  Tue Jul 30, 2021  2017 Consulted hospitalist, Dr. Jean Rosenthal, who is agreeable to admitting this patient to his service.  Appreciate his collaboration in the care of this patient. [RS]  Wed Jul 31, 2021  0852 Patient has come out of her room and is requesting discharge, she understands that she needs to take her diuretic. She has also called her Cardiologist but did not have an appointment until April. Recommend she call back for a sooner appointment and RTED for any worsening symptoms or other concerns.  [CS]    Clinical Course User Index [CS] Truddie Hidden, MD [RS] Yvonne Kendall Gypsy Balsam, PA-C   Medical Decision Making Amount and/or Complexity of Data Reviewed Labs: ordered. Radiology: ordered.  Risk Prescription drug management. Decision regarding hospitalization.          Truddie Hidden, MD 07/31/21 [phone]

## 2021-07-31 NOTE — ED Notes (Signed)
Put pt back on 2L [place]. Pt previously was on 2L [place] but it came out of nose.

## 2021-07-31 NOTE — ED Notes (Signed)
ED Provider at bedside. 

## 2021-08-07 ENCOUNTER — Ambulatory Visit: Payer: Medicare Other | Admitting: Physician Assistant

## 2021-08-07 NOTE — Progress Notes (Deleted)
Cardiology Office Note:    Date:  08/07/2021   ID:  Sabrina Hernandez, DOB 16-May-1935, MRN [id number]  PCP:  [name], MD  [facility] Providers Cardiologist:  [name], MD { Click to update primary MD,subspecialty MD or APP then REFRESH:1}  *** Referring MD: Ronnald Nian, DO   Chief Complaint:  No chief complaint on file. {Click here for Visit Info    :1}   Patient Profile: HFrEF Presumed nonischemic cardiomyopathy (negative Myoview at [facility]) Intolerant of metoprolol/ACE inhibitor due to fatigue Echo 2017: EF 20-25 Echo 3/22: EF <20 Hypertension  GERD LBBB   Prior CV Studies: Echocardiogram 10/01/2020 EF <20, normal RVSF, trivial pericardial effusion  Echocardiogram 08/30/2020 EF <20, global HK, GR 1 DD, normal RVSF, mild MR, AV sclerosis without stenosis, small-moderate pericardial effusion {Select studies to display:26339}   History of Present Illness:   Sabrina Hernandez is a 87 y.o. female with the above problem list.  She was last seen by Dr. Johney Frame in 3/22.  She declined proceeding with invasive evaluation for her cardiomyopathy (cardiac catheterization) and preferred conservative management.  The patient reported black stools at that time.  She had been taken off of aspirin and was asked to follow-up with GI.  Plan was to follow-up in 1 month.  She saw gastroenterology in 3/22.  Hemoccults were negative.  Hemoglobin was normal.  No further GI work-up was recommended at that time.  She was seen in the emergency room 07/30/2021 with decompensated heart failure.  Notes indicate she was not taking furosemide consistently.  Troponins were minimally elevated but without significant delta (flat).  BNP was significantly elevated at 1000 277.  Chest x-ray demonstrated small effusions and mild vascular congestion.  She was treated with IV furosemide.  Admission to the hospital was recommended.  However the patient declined and was asked to follow-up  with cardiology.***        Past Medical History:  Diagnosis Date   CHF (congestive heart failure) (HCC)    Diverticulitis    Shortness of breath dyspnea    with exertion    Current Medications: No outpatient medications have been marked as taking for the 08/07/21 encounter (Appointment) with [name] T, PA-C.    Allergies:   Patient has no known allergies.   Social History   Tobacco Use   Smoking status: Former   Smokeless tobacco: Never  Scientific laboratory technician Use: Never used  Substance Use Topics   Alcohol use: Yes    Comment: social    Drug use: No    Family Hx: The patient's family history includes Alcohol abuse in her father; Early death in her mother; Heart failure in her mother; Mental illness in her father; Stroke in her father.  ROS   EKGs/Labs/Other Test Reviewed:    EKG:  EKG is *** ordered today.  The ekg ordered today demonstrates ***  Recent Labs: 08/16/2020: NT-Pro BNP 2,765 09/13/2020: Magnesium 2.2 07/30/2021: ALT 32; B Natriuretic Peptide 1,277.6; BUN 13; Creatinine, Ser 0.74; Hemoglobin 14.0; Platelets 190; Potassium 3.7; Sodium 136   Recent Lipid Panel No results for input(s): CHOL, TRIG, HDL, VLDL, LDLCALC, LDLDIRECT in the last 8760 hours.   Risk Assessment/Calculations:   {Does this patient have ATRIAL FIBRILLATION?:[id number]}     Physical Exam:    VS:  There were no vitals taken for this visit.    Wt Readings from Last 3 Encounters:  07/30/21 112 lb (50.8 kg)  09/13/20 104 lb (47.2 kg)  09/06/20 108 lb 6.4 oz (49.2 kg)    Physical Exam ***     ASSESSMENT & PLAN:   No problem-specific Assessment & Plan notes found for this encounter.        {Are you ordering a CV Procedure (e.g. stress test, cath, DCCV, TEE, etc)?   Press F2        :[id number]}  Dispo:  No follow-ups on file.   Medication Adjustments/Labs and Tests Ordered: Current medicines are reviewed at length with the patient today.  Concerns regarding medicines are outlined  above.  Tests Ordered: No orders of the defined types were placed in this encounter.  Medication Changes: No orders of the defined types were placed in this encounter.  Signed, [name], PA-C  08/07/2021 12:43 PM    [facility] [address], [place], [place]  [address] Phone: [phone]; Fax: [phone]

## 2021-08-11 NOTE — Progress Notes (Signed)
Cardiology Office Note:    Date:  08/12/2021   ID:  Sabrina Hernandez, DOB 1934/08/07, MRN [id number]  PCP:  [name], MD  [facility] Providers Cardiologist:  [name], MD    Referring MD: [name],*   Chief Complaint:  Hospitalization Follow-up (Seen in ED with CHF)    Patient Profile: (HFrEF) heart failure with reduced ejection fraction  Presumed nonischemic cardiomyopathy  Myoview 4/17 [facility]): EF 28, no ischemia Intolerant of metoprolol/ACE inhibitor due to fatigue Echo 2017: EF 20-25 Echo 4/22: EF <20 Hypertension  GERD LBBB  Prior CV Studies: Echocardiogram 10/01/2020 EF <20, normal RVSF, trivial pericardial effusion  Echocardiogram 08/30/2020 EF <20, global HK, GR 1 DD, normal RVSF, mild MR, AV sclerosis without stenosis, small-moderate pericardial effusion    History of Present Illness:   Sabrina Hernandez is a 87 y.o. female with the above problem list.  She was last seen by Dr. Johney Frame in 3/22.  She declined proceeding with invasive evaluation for her cardiomyopathy (cardiac catheterization) and preferred conservative management.  The patient reported black stools at that time.  She had been taken off of aspirin and was asked to follow-up with GI.  Plan was to follow-up in 1 month.  She saw gastroenterology in 3/22.  Hemoccults were negative.  Hemoglobin was normal.  No further GI work-up was recommended at that time.  She was seen in the emergency room 07/30/2021 with decompensated heart failure.  Notes indicate she was not taking furosemide consistently.  Troponins were minimally elevated but without significant delta (flat).  BNP was significantly elevated at 1277.  Chest x-ray demonstrated small effusions and mild vascular congestion.  She was treated with IV furosemide.  Admission to the hospital was recommended.  However the patient declined and was asked to follow-up with cardiology.  She is here today with her husband,  Sabrina Hernandez.  She has felt better since she went to the emergency room.  She notes significant diuresis with IV furosemide.  Her weight decreased from 112 to 105.  Her breathing has remained improved.  She sleeps on 2 pillows chronically.  She has not had paroxysmal nocturnal dyspnea, leg edema, syncope, chest pain.  She felt somewhat more short of breath last night prior to bed but was able to sleep.  Her weight here today is 108 (? 3 lbs).  Of note, she stopped taking dapagliflozin, Entresto, metoprolol succinate, spironolactone many months ago.  Past Medical History:  Diagnosis Date   Diverticulitis    HFrEF (heart failure with reduced ejection fraction) ([place]) 07/30/2021   Presumed nonischemic cardiomyopathy  Myoview 4/17 [facility] Med): EF 28, no ischemia Intolerant of metoprolol/ACE inhibitor due to fatigue Echo 2017: EF 20-25 Echocardiogram 10/01/2020:  EF <20, normal RVSF, trivial pericardial effusion Echocardiogram 08/30/2020: EF <20, global HK, GR 1 DD, normal RVSF, mild MR, AV sclerosis without stenosis, small-moderate pericardial effusion    Current Medications: Current Meds  Medication Sig   bisoprolol (ZEBETA) 5 MG tablet Take 0.5 tablets (2.5 mg total) by mouth daily.   furosemide (LASIX) 20 MG tablet Take 1 tablet (20 mg total) by mouth 2 (two) times daily for 3 days, THEN 1 tablet (20 mg total) daily for 24 days.   furosemide (LASIX) 20 MG tablet Take 1 tablet (20 mg total) by mouth daily.    Allergies:   Patient has no known allergies.   Social History   Tobacco Use   Smoking status: Former   Smokeless tobacco: Never  Vaping  Use   Vaping Use: Never used  Substance Use Topics   Alcohol use: Yes    Comment: social    Drug use: No    Family Hx: The patient's family history includes Alcohol abuse in her father; Early death in her mother; Heart failure in her mother; Mental illness in her father; Stroke in her father.  Review of Systems  Constitutional: Negative for fever.  Respiratory:   Positive for cough (dry).   Gastrointestinal:  Negative for hematochezia.  Genitourinary:  Negative for hematuria.    EKGs/Labs/Other Test Reviewed:    EKG:  EKG is   ordered today.  The ekg ordered today demonstrates NSR, HR 95, LBBB, QTc 530  Recent Labs: 08/16/2020: NT-Pro BNP 2,765 09/13/2020: Magnesium 2.2 07/30/2021: ALT 32; B Natriuretic Peptide 1,277.6; BUN 13; Creatinine, Ser 0.74; Hemoglobin 14.0; Platelets 190; Potassium 3.7; Sodium 136   Recent Lipid Panel No results for input(s): CHOL, TRIG, HDL, VLDL, LDLCALC, LDLDIRECT in the last 8760 hours.   Risk Assessment/Calculations:         Physical Exam:    VS:  BP 124/64 (BP Location: Left Arm, Patient Position: Sitting)    Pulse 86    Ht 5' (1.524 m)    Wt 108 lb (49 kg)    SpO2 98%    BMI 21.09 kg/m     Wt Readings from Last 3 Encounters:  08/12/21 108 lb (49 kg)  07/30/21 112 lb (50.8 kg)  09/13/20 104 lb (47.2 kg)    Constitutional:      Appearance: Healthy appearance. Not in distress.  Neck:     Vascular: No JVR. JVD normal.  Pulmonary:     Effort: Pulmonary effort is normal.     Breath sounds: No wheezing. No rales.  Cardiovascular:     Normal rate. Irregular rhythm. Normal S1. Normal S2.      Murmurs: There is no murmur.  Pulses:    Intact distal pulses.  Edema:    Peripheral edema absent.  Abdominal:     Palpations: Abdomen is soft.  Skin:    General: Skin is warm and dry.  Neurological:     Mental Status: Alert and oriented to person, place and time.     Cranial Nerves: Cranial nerves are intact.        ASSESSMENT & PLAN:   HFrEF (heart failure with reduced ejection fraction) (HCC) Presumed non-ischemic cardiomyopathy.  Echocardiogram in 4/22 with EF < 20.  She is NYHA II.  She continues to decline further work-up for her cardiomyopathy.  She has been fairly stable since she went to the ED on 1/31.  Her symptoms last night sound suspicious for some volume excess.  Her weight here is up 3 lbs.  She  stopped all HF meds some time ago.  She is not sure about what side effects she had with these.  She was previously on Entresto, Farxiga, Toprol and Spironolactone.  We discussed the benefits of the "4 Pillars of HF Treatment."  I have encouraged her to try to get back on at least a beta-blocker and ACE/ARB.  Her heart rate is elevated.  I will try her back on low-dose beta-blocker.  When she returns in several weeks, we can try low-dose ACE/ARB. Continue furosemide 20 mg daily Take extra dose of furosemide 20 mg x 1 today BMET, magnesium Start bisoprolol 2.5 mg daily Follow-up 3-4 weeks  Essential hypertension Blood pressure well controlled.  Continue furosemide 20 mg daily.  Add back  beta-blocker therapy with bisoprolol as noted.        Dispo:  Return in about 4 weeks (around 09/09/2021) for Routine follow up in 4 weeks with Dr.Pemberton. .   Medication Adjustments/Labs and Tests Ordered: Current medicines are reviewed at length with the patient today.  Concerns regarding medicines are outlined above.  Tests Ordered: Orders Placed This Encounter  Procedures   Basic Metabolic Panel (BMET)   Magnesium   EKG 12-Lead   Medication Changes: Meds ordered this encounter  Medications   furosemide (LASIX) 20 MG tablet    Sig: Take 1 tablet (20 mg total) by mouth daily.    Dispense:  90 tablet    Refill:  3   bisoprolol (ZEBETA) 5 MG tablet    Sig: Take 0.5 tablets (2.5 mg total) by mouth daily.    Dispense:  45 tablet    Refill:  3   Signed, [name], PA-C  08/12/2021 9:06 AM    [facility] [place], [place], [place]  [address] Phone: [phone]; Fax: [phone]

## 2021-08-12 ENCOUNTER — Ambulatory Visit (INDEPENDENT_AMBULATORY_CARE_PROVIDER_SITE_OTHER): Payer: Medicare Other | Admitting: Physician Assistant

## 2021-08-12 ENCOUNTER — Encounter: Payer: Self-pay | Admitting: Physician Assistant

## 2021-08-12 ENCOUNTER — Other Ambulatory Visit: Payer: Self-pay

## 2021-08-12 VITALS — BP 124/64 | HR 86 | Ht 60.0 in | Wt 108.0 lb

## 2021-08-12 DIAGNOSIS — I5022 Chronic systolic (congestive) heart failure: Secondary | ICD-10-CM | POA: Diagnosis not present

## 2021-08-12 DIAGNOSIS — I5023 Acute on chronic systolic (congestive) heart failure: Secondary | ICD-10-CM

## 2021-08-12 DIAGNOSIS — I10 Essential (primary) hypertension: Secondary | ICD-10-CM | POA: Diagnosis not present

## 2021-08-12 LAB — BASIC METABOLIC PANEL
BUN/Creatinine Ratio: 14 (ref 12–28)
BUN: 13 mg/dL (ref 8–27)
CO2: 28 mmol/L (ref 20–29)
Calcium: 9.6 mg/dL (ref 8.7–10.3)
Chloride: 103 mmol/L (ref 96–106)
Creatinine, Ser: 0.9 mg/dL (ref 0.57–1.00)
Glucose: 88 mg/dL (ref 70–99)
Potassium: 3.6 mmol/L (ref 3.5–5.2)
Sodium: 145 mmol/L — ABNORMAL HIGH (ref 134–144)
eGFR: 63 mL/min/{1.73_m2} (ref >59)

## 2021-08-12 LAB — MAGNESIUM: Magnesium: 2 mg/dL (ref 1.6–2.3)

## 2021-08-12 MED ORDER — FUROSEMIDE 20 MG PO TABS: 20.0000 mg | ORAL_TABLET | Freq: Every day | ORAL | 3 refills | Status: DC

## 2021-08-12 MED ORDER — BISOPROLOL FUMARATE 5 MG PO TABS: 2.5000 mg | ORAL_TABLET | Freq: Every day | ORAL | 3 refills | Status: DC

## 2021-08-12 NOTE — Patient Instructions (Addendum)
Medication Instructions:   TAKE extra lasix today than resume one (1) tablet by mouth ( 20 mg) daily.   START Bisoprolol  one half tablet by mouth ( 2.5 mg) daily.   *If you need a refill on your cardiac medications before your next appointment, please call your pharmacy*   Lab Work:  TODAY!!!! BMET/MAG  If you have labs (blood work) drawn today and your tests are completely normal, you will receive your results only by: [place] (if you have MyChart) OR A paper copy in the mail If you have any lab test that is abnormal or we need to change your treatment, we will call you to review the results.   Testing/Procedures:  None ordered.    Follow-Up: At [facility], you and your health needs are our priority.  As part of our continuing mission to provide you with exceptional heart care, we have created designated Provider Care Teams.  These Care Teams include your primary Cardiologist (physician) and Advanced Practice Providers (APPs -  Physician Assistants and Nurse Practitioners) who all work together to provide you with the care you need, when you need it.  We recommend signing up for the patient portal called "MyChart".  Sign up information is provided on this After Visit Summary.  MyChart is used to connect with patients for Virtual Visits (Telemedicine).  Patients are able to view lab/test results, encounter notes, upcoming appointments, etc.  Non-urgent messages can be sent to your provider as well.   To learn more about what you can do with MyChart, go to [email or web address].    Your next appointment:   4 week(s)  The format for your next appointment:   In Person  Provider:   [name], MD     Other Instructions   Please Weigh daily call or send mychart message if weight is up 3 lbs in 24 hours.

## 2021-08-12 NOTE — Assessment & Plan Note (Signed)
Blood pressure well controlled.  Continue furosemide 20 mg daily.  Add back beta-blocker therapy with bisoprolol as noted.

## 2021-08-12 NOTE — Assessment & Plan Note (Signed)
Presumed non-ischemic cardiomyopathy.  Echocardiogram in 4/22 with EF < 20.  She is NYHA II.  She continues to decline further work-up for her cardiomyopathy.  She has been fairly stable since she went to the ED on 1/31.  Her symptoms last night sound suspicious for some volume excess.  Her weight here is up 3 lbs.  She stopped all HF meds some time ago.  She is not sure about what side effects she had with these.  She was previously on Entresto, Farxiga, Toprol and Spironolactone.  We discussed the benefits of the "4 Pillars of HF Treatment."  I have encouraged her to try to get back on at least a beta-blocker and ACE/ARB.  Her heart rate is elevated.  I will try her back on low-dose beta-blocker.  When she returns in several weeks, we can try low-dose ACE/ARB.  Continue furosemide 20 mg daily  Take extra dose of furosemide 20 mg x 1 today  BMET, magnesium  Start bisoprolol 2.5 mg daily  Follow-up 3-4 weeks

## 2021-09-17 NOTE — Progress Notes (Signed)
?Cardiology Office Note:   ? ?Date:  09/20/2021  ? ?ID:  Sabrina Hernandez, DOB 12/09/1934, MRN [id number] ? ?PCP:  [name], MD ?  ?[place]  ?Cardiologist:  [name], MD  ?Advanced Practice Provider:  No care team member to display ?Electrophysiologist:  None  ? ? ?Referring MD: [name],*  ? ? ? ?History of Present Illness:   ? ?Sabrina Hernandez is a 87 y.o. female with a hx of HTN, GERD and systolic heart failure who presents to clinic for follow-up of her HFrEF. ?  ?She was initially diagnosed with HFrEF in 2017 when she presented with worsening SOB. Was seen at [facility]. Declined cath at that time but myoview was negative for ischemia. She was initially on metop/ACE but these were later stopped due to fatigue. She has not followed regularly with a cardiologist. Last TTE was in 2017. No ICD in place.  ?  ?Was seen in [facility] ED on 08/02/20 with worsening DOE over the past couple of months as well as orthopnea. In the ED, ECG with NSR, LAE, LBBB. LVH. TTE with LVEF 20-25% in 2017. BNP 1145. Trop 49>>53. COVID negative. She was diuresed with lasix IV with good response. She was discharged home with plans to follow-up with Cardiology for further management. ?  ?During our visit 08/09/20 the patient was doing better but continued to have SOB and orthopnea. BNP 4251. We resumed diuresis with lasix '20mg'$  BID. ?  ?During our visit on 08/24/20, the patient reported having black stools. On ASA but not AC. We stopped her ASA and referred to GI. CBC with HgB 15.1. ? ?TTE on 08/29/20 with LVEF <20% with global hypokinesis. G1DD, mild MR, small to moderate pericardial effusion. Patient has adamently declined any invasive testing. ? ?Had recurrent ER visit on 06/2021 with decompensated HF in the setting of not taking her lasix. BNP 1277 and CXR with pulmonary edema. She was given IV lasix and recommended for admission but she declined and wanted to go home. ? ?Last saw  [name] on 07/2021. She was feeling better but had stopped most of her HF medications. ? ?Today, the patient overall feels better today. Has stopped the bystolic because she was having difficulty controlling her bowels. She states she stopped the other medications because she ran out of them and thought that was the end of their course. Denies SOB, chest pain, lightheadedness, dizziness, syncope. No LE edema. Has had to sleep propped up on pillows due to orthopnea. Wt today is 107lbs; dry weight is 103lbs.  ? ?Notably, she is leaving for [place] in May. ? ?Past Medical History:  ?Diagnosis Date  ? Diverticulitis   ? HFrEF (heart failure with reduced ejection fraction) ([place]) 07/30/2021  ? Presumed nonischemic cardiomyopathy  Myoview 4/17 [facility]): EF 28, no ischemia Intolerant of metoprolol/ACE inhibitor due to fatigue Echo 2017: EF 20-25 Echocardiogram 10/01/2020:  EF <20, normal RVSF, trivial pericardial effusion Echocardiogram 08/30/2020: EF <20, global HK, GR 1 DD, normal RVSF, mild MR, AV sclerosis without stenosis, small-moderate pericardial effusion   ? ? ?Past Surgical History:  ?Procedure Laterality Date  ? CERVICAL CONE BIOPSY    ? HAMMER TOE SURGERY    ? right foot   ? ORIF PATELLA Left 01/29/2015  ? Procedure: OPEN REDUCTION INTERNAL (ORIF) FIXATION LEFT PATELLA;  Surgeon: Paralee Cancel, MD;  Location: [facility] ORS;  Service: Orthopedics;  Laterality: Left;  ? ? ?Current Medications: ?Current Meds  ?Medication Sig  ?  Multiple Vitamin (MULTIVITAMIN WITH MINERALS) TABS tablet Take 1 tablet by mouth every morning.  ? potassium chloride (KLOR-CON M) 10 MEQ tablet Take 1 tablet (10 mEq total) by mouth 2 (two) times daily for 3 days only, THEN 1 tablet (10 mEq total) by mouth daily for 24 days.  ? sacubitril-valsartan (ENTRESTO) 24-26 MG Take 1 tablet by mouth 2 (two) times daily.  ? [DISCONTINUED] furosemide (LASIX) 20 MG tablet Take 1 tablet (20 mg total) by mouth daily.  ?  ? ?Allergies:   Patient has no known  allergies.  ? ?Social History  ? ?Socioeconomic History  ? Marital status: Married  ?  Spouse name: Herbie Baltimore  ? Number of children: 5  ? Years of education: Not on file  ? Highest education level: Not on file  ?Occupational History  ? Occupation: Teacher/instructor-retired  ?  Employer: [employer]  ?Tobacco Use  ? Smoking status: Former  ? Smokeless tobacco: Never  ?Vaping Use  ? Vaping Use: Never used  ?Substance and Sexual Activity  ? Alcohol use: Yes  ?  Comment: social   ? Drug use: No  ? Sexual activity: Not on file  ?Other Topics Concern  ? Not on file  ?Social History Narrative  ? She is from [place], [place] originally but has lived in [place] for 30 years. She would like to move back. She is married with 5 grown children (3 in [place] and 2 in [place]. She is a retired Psychologist, prison and probation services.  ? ?Social Determinants of Health  ? ?Financial Resource Strain: Not on file  ?Food Insecurity: Not on file  ?Transportation Needs: Not on file  ?Physical Activity: Not on file  ?Stress: Not on file  ?Social Connections: Not on file  ?  ? ?Family History: ?The patient's family history includes Alcohol abuse in her father; Early death in her mother; Heart failure in her mother; Mental illness in her father; Stroke in her father. ? ?ROS:   ?Please see the history of present illness.    ?Review of Systems  ?Constitutional:  Negative for chills and fever.  ?HENT:  Negative for hearing loss.   ?Eyes:  Negative for blurred vision and redness.  ?Respiratory:  Negative for shortness of breath.   ?Cardiovascular:  Negative for chest pain, palpitations, orthopnea, claudication, leg swelling and PND.  ?Gastrointestinal:  Negative for melena, nausea and vomiting.  ?Genitourinary:  Negative for dysuria.  ?Musculoskeletal:  Positive for joint pain. Negative for falls.  ?Neurological:  Negative for dizziness and loss of consciousness.  ?Endo/Heme/Allergies:  Negative for polydipsia.  ?Psychiatric/Behavioral:  Negative for substance abuse.    ? ?EKGs/Labs/Other Studies Reviewed:   ? ?The following studies were reviewed today: ?TTE 08/30/20: ? 1. Left ventricular ejection fraction, by estimation, is <20%. The left  ?ventricle has severely decreased function. The left ventricle demonstrates  ?global hypokinesis. The left ventricular internal cavity size was mildly  ?dilated. Left ventricular  ?diastolic parameters are consistent with Grade I diastolic dysfunction  ?(impaired relaxation). Elevated left ventricular end-diastolic pressure.  ? 2. Right ventricular systolic function is normal. The right ventricular  ?size is normal. Tricuspid regurgitation signal is inadequate for assessing  ?PA pressure.  ? 3. The mitral valve is degenerative. Mild mitral valve regurgitation. No  ?evidence of mitral stenosis.  ? 4. The aortic valve is tricuspid. Aortic valve regurgitation is not  ?visualized. Mild aortic valve sclerosis is present, with no evidence of  ?aortic valve stenosis.  ? 5. The inferior  vena cava is normal in size with greater than 50%  ?respiratory variability, suggesting right atrial pressure of 3 mmHg.  ? 6. A small to moderate pericardial effusion is present. The pericardial  ?effusion is circumferential with no evidence of pericardial tamponade. ? ?Myoview 2017: ?1. No clear evidence for reversibility or ischemia..  ? ?2. Dilated left ventricle with diffuse hypokinesia and paradoxical  ?motion along the septal wall.  ? ?3. Left ventricular ejection fraction is 28%.  ? ?4. High-risk stress test findings*.  ?  ?TTE 2017: ?Findings  ?Mitral Valve  ?Structurally normal mitral valve.  ?Mild to moderate mitral regurgitation by color flow doppler examination.  ?Aortic Valve  ?Structurally normal aortic valve with good leaflet mobility, and no  ?regurgitation by color flow doppler examination.  ?Tricuspid Valve  ?Tricuspid valve is structurally normal. Trace tricuspid regurgitation by color  ?flow doppler examination. Normal estimated pulmonary artery  pressure  ?Pulmonic Valve  ?Pulmonic valve is structurally normal.  ?Mild pulmonic regurgitation present by color flow doppler examination.  ?No evidence of pulmonic stenosis.  ?Left Atrium  ?Left atrial volu

## 2021-09-20 ENCOUNTER — Telehealth: Payer: Self-pay | Admitting: *Deleted

## 2021-09-20 ENCOUNTER — Encounter: Payer: Self-pay | Admitting: Cardiology

## 2021-09-20 ENCOUNTER — Other Ambulatory Visit: Payer: Self-pay

## 2021-09-20 ENCOUNTER — Ambulatory Visit (INDEPENDENT_AMBULATORY_CARE_PROVIDER_SITE_OTHER): Payer: Medicare Other | Admitting: Cardiology

## 2021-09-20 VITALS — BP 126/80 | HR 76 | Ht 60.0 in | Wt 107.4 lb

## 2021-09-20 DIAGNOSIS — Z79899 Other long term (current) drug therapy: Secondary | ICD-10-CM

## 2021-09-20 DIAGNOSIS — R252 Cramp and spasm: Secondary | ICD-10-CM | POA: Diagnosis not present

## 2021-09-20 DIAGNOSIS — I3139 Other pericardial effusion (noninflammatory): Secondary | ICD-10-CM

## 2021-09-20 DIAGNOSIS — I5023 Acute on chronic systolic (congestive) heart failure: Secondary | ICD-10-CM

## 2021-09-20 DIAGNOSIS — I10 Essential (primary) hypertension: Secondary | ICD-10-CM

## 2021-09-20 MED ORDER — ENTRESTO 24-26 MG PO TABS: 1.0000 | ORAL_TABLET | Freq: Two times a day (BID) | ORAL | 2 refills | Status: DC

## 2021-09-20 MED ORDER — POTASSIUM CHLORIDE CRYS ER 10 MEQ PO TBCR: EXTENDED_RELEASE_TABLET | ORAL | 0 refills | Status: DC

## 2021-09-20 MED ORDER — FUROSEMIDE 20 MG PO TABS: ORAL_TABLET | ORAL | 0 refills | Status: DC

## 2021-09-20 NOTE — Patient Instructions (Signed)
Medication Instructions:  ? ?START TAKING LASIX 20 MG BY MOUTH TWICE DAILY FOR 3 DAYS ONLY, THEN DECREASE TO TAKING 20 MG BY MOUTH DAILY THEREAFTER. ? ?START TAKING POTASSIUM CHLORIDE 10 mEq BY MOUTH TWICE DAILY FOR 3 DAYS ONLY, THEN DECREASE TO TAKING 10 mEq BY MOUTH DAILY THEREAFTER--THIS IS IN CONCURRENT USE WITH LASIX ? ?DECREASE AND START TAKING ENTRESTO 24/26 MG DOSE--TAKE 1 TABLET BY MOUTH TWICE DAILY ? ?*If you need a refill on your cardiac medications before your next appointment, please call your pharmacy* ? ? ?You have been referred to OUR BP CLINIC TO SEE OUR PHARMACIST ASAP--MUST BE SCHEDULED FIRST OF April OR EARLY-MID April PER DR. Johney Frame FOR MED TITRATION Delene Loll) ? ? ?Lab Work: ? ?1.) TODAY--BMET, MAGNESIUM LEVEL, AND PRO-BNP ? ?2.)  IN ONE WEEK --BMET ? ?If you have labs (blood work) drawn today and your tests are completely normal, you will receive your results only by: ?MyChart Message (if you have MyChart) OR ?A paper copy in the mail ?If you have any lab test that is abnormal or we need to change your treatment, we will call you to review the results. ? ? ?Follow-Up: ? ?IN EARLY MAY WITH SCOTT WEAVER PA-C--MUST BE FIRST WEEK IN MAY ? ? ? ?

## 2021-09-20 NOTE — Telephone Encounter (Signed)
-----   Message from [name], MD sent at 09/20/2021 12:48 PM EDT ----- ?Regarding: RE: NEEDS PHARMD APPT EARLY APRIL PER [place] ?[facility]. ?----- Message ----- ?From: Ramond Dial, RPH-CPP ?Sent: 09/20/2021  12:38 PM EDT ?To: Nuala Alpha, LPN, Leeroy Bock, RPH-CPP, # ?Subject: RE: NEEDS PHARMD APPT EARLY APRIL PER [place]# ? ?I squeezed her in on April 7 at 11 AM. Karlene Einstein, can you let her know? ?----- Message ----- ?From: [name], MD ?Sent: 09/20/2021  12:35 PM EDT ?To: Nuala Alpha, LPN, Leeroy Bock, RPH-CPP, # ?Subject: RE: NEEDS PHARMD APPT EARLY APRIL PER [place]# ? ?She stopped literally everything. EF<20%. Just trying to add things back before she leaves for [place] in May. ?----- Message ----- ?From: Nuala Alpha, LPN ?Sent: 09/20/2021  12:13 PM EDT ?To: Leeroy Bock, RPH-CPP, # ?Subject: NEEDS PHARMD APPT EARLY APRIL PER [place]   ? ?This pt needs early April to mid April appt with you guys for med titration of Entresto per Dr. Johney Frame.  Referral is in. I know you guys are booked out, so wanted to ask if you have a cancellation could she be seen or placed somewhere?  She stopped all her HF meds and we restarted them today, so she will require close follow-up.  Can you assist? ? ?Thanks ?Jillayne Witte  ? ? ? ? ? ? ?

## 2021-09-21 LAB — PRO B NATRIURETIC PEPTIDE: NT-Pro BNP: 8788 pg/mL — ABNORMAL HIGH (ref 0–738)

## 2021-09-21 LAB — BASIC METABOLIC PANEL
BUN/Creatinine Ratio: 16 (ref 12–28)
BUN: 17 mg/dL (ref 8–27)
CO2: 26 mmol/L (ref 20–29)
Calcium: 10 mg/dL (ref 8.7–10.3)
Chloride: 105 mmol/L (ref 96–106)
Creatinine, Ser: 1.05 mg/dL — ABNORMAL HIGH (ref 0.57–1.00)
Glucose: 69 mg/dL — ABNORMAL LOW (ref 70–99)
Potassium: 4.1 mmol/L (ref 3.5–5.2)
Sodium: 146 mmol/L — ABNORMAL HIGH (ref 134–144)
eGFR: 52 mL/min/{1.73_m2} — ABNORMAL LOW (ref >59)

## 2021-09-21 LAB — MAGNESIUM: Magnesium: 2.2 mg/dL (ref 1.6–2.3)

## 2021-09-23 ENCOUNTER — Telehealth: Payer: Self-pay | Admitting: *Deleted

## 2021-09-23 DIAGNOSIS — I10 Essential (primary) hypertension: Secondary | ICD-10-CM

## 2021-09-23 DIAGNOSIS — Z79899 Other long term (current) drug therapy: Secondary | ICD-10-CM

## 2021-09-23 DIAGNOSIS — R252 Cramp and spasm: Secondary | ICD-10-CM

## 2021-09-23 DIAGNOSIS — I5023 Acute on chronic systolic (congestive) heart failure: Secondary | ICD-10-CM

## 2021-09-23 DIAGNOSIS — I3139 Other pericardial effusion (noninflammatory): Secondary | ICD-10-CM

## 2021-09-23 MED ORDER — POTASSIUM CHLORIDE CRYS ER 10 MEQ PO TBCR: EXTENDED_RELEASE_TABLET | ORAL | 0 refills | Status: DC

## 2021-09-23 MED ORDER — FUROSEMIDE 20 MG PO TABS: ORAL_TABLET | ORAL | 0 refills | Status: DC

## 2021-09-23 NOTE — Telephone Encounter (Signed)
The patient has been notified of the result and verbalized understanding.  All questions (if any) were answered. ? ?Pt aware to increase her lasix to 20 mg po bid for 5 days (instead of 3 days as we advised her at last Friday's OV) then decrease to daily thereafter, and increase her KDUR to 10 mEq po BID for 5 days (instead of 3 days as we advised at last OV) then decrease to daily thereafter.  She is aware to come to her scheduled repeat lab on Friday 3/31.  Pt states she has plenty of lasix and KDUR on hand and wrote down instructions I provided her on the phone to increase both meds.  ?Pt verbalized understanding and agrees with this plan.  ? ?

## 2021-09-23 NOTE — Telephone Encounter (Signed)
-----   Message from [name], MD sent at 09/21/2021  8:31 AM EDT ----- ?Her fluid levels are significantly elevated. Her kidney function and electrolytes look okay. Can we increase her lasix to '20mg'$  BID for 5 days (instead of 3 days) and K+ to BID for 5 days (instead of 3 days). We will follow-up her labs next week.  ?

## 2021-09-27 ENCOUNTER — Other Ambulatory Visit: Payer: Medicare Other | Admitting: *Deleted

## 2021-09-27 DIAGNOSIS — Z79899 Other long term (current) drug therapy: Secondary | ICD-10-CM | POA: Diagnosis not present

## 2021-09-27 DIAGNOSIS — R252 Cramp and spasm: Secondary | ICD-10-CM | POA: Diagnosis not present

## 2021-09-27 DIAGNOSIS — I3139 Other pericardial effusion (noninflammatory): Secondary | ICD-10-CM | POA: Diagnosis not present

## 2021-09-27 DIAGNOSIS — I5023 Acute on chronic systolic (congestive) heart failure: Secondary | ICD-10-CM | POA: Diagnosis not present

## 2021-09-27 DIAGNOSIS — I10 Essential (primary) hypertension: Secondary | ICD-10-CM | POA: Diagnosis not present

## 2021-09-28 LAB — BASIC METABOLIC PANEL
BUN/Creatinine Ratio: 14 (ref 12–28)
BUN: 16 mg/dL (ref 8–27)
CO2: 26 mmol/L (ref 20–29)
Calcium: 9.7 mg/dL (ref 8.7–10.3)
Chloride: 100 mmol/L (ref 96–106)
Creatinine, Ser: 1.11 mg/dL — ABNORMAL HIGH (ref 0.57–1.00)
Glucose: 68 mg/dL — ABNORMAL LOW (ref 70–99)
Potassium: 4.5 mmol/L (ref 3.5–5.2)
Sodium: 141 mmol/L (ref 134–144)
eGFR: 49 mL/min/{1.73_m2} — ABNORMAL LOW (ref >59)

## 2021-09-30 ENCOUNTER — Telehealth: Payer: Self-pay | Admitting: *Deleted

## 2021-09-30 DIAGNOSIS — Z8679 Personal history of other diseases of the circulatory system: Secondary | ICD-10-CM

## 2021-09-30 DIAGNOSIS — I5022 Chronic systolic (congestive) heart failure: Secondary | ICD-10-CM

## 2021-09-30 DIAGNOSIS — I5023 Acute on chronic systolic (congestive) heart failure: Secondary | ICD-10-CM

## 2021-09-30 DIAGNOSIS — Z79899 Other long term (current) drug therapy: Secondary | ICD-10-CM

## 2021-09-30 NOTE — Telephone Encounter (Signed)
-----   Message from [name], MD sent at 09/30/2021  1:06 PM EDT ----- ?Kidney function bumped slightly which is okay if her fluid status is better. Is she taking the lasix daily? We will need repeat labs at follow-up. ?

## 2021-09-30 NOTE — Telephone Encounter (Signed)
The patient has been notified of the result and verbalized understanding.  All questions (if any) were answered. ? ?Pt states her fluid status has improved so much.  She states she is feeling much better and her swelling has very much improved.  She states she is taking her lasix just as Dr. Johney Frame advised her to do.  She will see our Pharmacist for CHF management on this Friday 4/7 and [name] PA-C for follow-up on 5/3. ?She is aware when she comes to see Scott on 5/3, we will have her repeat lab to recheck a BMET at that time.  ?Lab appt scheduled same day as the pt see's Scott on 5/3.  ?Advised her to continue her current regimen and follow-up as planned.  ?Informed the pt that I will make Dr. Johney Frame aware of her improved symptoms and fluid status.  ?Pt verbalized understanding and agrees with this plan. ? ?

## 2021-10-04 ENCOUNTER — Telehealth: Payer: Self-pay | Admitting: Cardiology

## 2021-10-04 ENCOUNTER — Ambulatory Visit (INDEPENDENT_AMBULATORY_CARE_PROVIDER_SITE_OTHER): Payer: Medicare Other | Admitting: Pharmacist

## 2021-10-04 VITALS — BP 138/75 | HR 77 | Wt 106.0 lb

## 2021-10-04 DIAGNOSIS — I5020 Unspecified systolic (congestive) heart failure: Secondary | ICD-10-CM | POA: Diagnosis not present

## 2021-10-04 MED ORDER — SPIRONOLACTONE 25 MG PO TABS: 12.5000 mg | ORAL_TABLET | Freq: Every day | ORAL | 3 refills | Status: DC

## 2021-10-04 NOTE — Telephone Encounter (Signed)
Have attempted to reach several times, ?Busy signal, or keeps ringing, no voiemail ? ?(We have looked in both areas and have not found a phone nor has one been turned in) ?

## 2021-10-04 NOTE — Progress Notes (Signed)
Patient ID: Sabrina Hernandez                 DOB: 09-Jan-1935                      MRN: [id number] ? ? ? ? ?HPI: ?Sabrina Hernandez is a 87 y.o. female referred by Dr. Johney Frame to pharmacy clinic for HF medication management. PMH is significant for HTN, HF and GERD. Most recent LVEF <20% on 10/01/2020. Today she returns to pharmacy clinic with her husband for further medication titration. At Feb 2023 visit with Sabrina Hernandez, patient had stopped taking her HF medications.  Patient denies invasive work-up and interventions, only wants medical management. At visit with Nicki Reaper, she was restarted on her diuretic and low-dose bisoprolol but experienced loss of bowel control. She was started on Entresto 24/'26mg'$  at appointment with Dr. Johney Frame in March.  ? ?Today she presents to pharmacy clinic with her husband for further medication titration. She said that she stopped her medications previously because she didn't feel like taking the medication. She also reports that doesn't care about living longer, but does want to stay our of the hospital. ?Home weights stable - 103 lb - and 106 lb in clinic today. Symptomatically, she is feeling well. Denies dizziness, lightheadedness. Has some fatigue that patient attributes to her age. Denies chest pain or palpitations. No LEE or SOB. Sleeps with 1 pillow. Able to complete all ADLs. Activity level mild/moderate. She regularly checks her weight at home (normal range 102 - 106 lbs). Appetite has been normal. She used to adhere to a low-salt diet but reports that she adds salt to most meals. Patient has a BP cuff at home but doesn't use it. Clinic BP readings are 145/70, 138/76 mmHg. ?She reports that her bowel issues have not stopped since stopping bisoprolol. Is only taking Entresto once a day, did not realize it was twice a day.  ? ?Current CHF meds:  ?Entresto 24/26 mg BID ?Lasix 20 mg daily ?Potassium 10 mEq daily ? ?Previously tried: Bisoprolol (GI upset) ?BP goal: <130/80  mmHg ? ?Family History: Heart failure - mother; stroke - father ? ?Social History: former tobacco use, alcohol use, no drug use ? ?Diet: adds salts to meals ? ?Exercise: somewhat active  ? ?Home BP readings: none ? ?Wt Readings from Last 3 Encounters:  ?09/20/21 107 lb 6.4 oz (48.7 kg)  ?08/12/21 108 lb (49 kg)  ?07/30/21 112 lb (50.8 kg)  ? ?BP Readings from Last 3 Encounters:  ?09/20/21 126/80  ?08/12/21 124/64  ?07/31/21 (!) 146/78  ? ?Pulse Readings from Last 3 Encounters:  ?09/20/21 76  ?08/12/21 86  ?07/31/21 85  ? ? ?Renal function: ?CrCl cannot be calculated (Unknown ideal weight.). ? ?Past Medical History:  ?Diagnosis Date  ? Diverticulitis   ? HFrEF (heart failure with reduced ejection fraction) ([place]) 07/30/2021  ? Presumed nonischemic cardiomyopathy  Myoview 4/17 [facility]): EF 28, no ischemia Intolerant of metoprolol/ACE inhibitor due to fatigue Echo 2017: EF 20-25 Echocardiogram 10/01/2020:  EF <20, normal RVSF, trivial pericardial effusion Echocardiogram 08/30/2020: EF <20, global HK, GR 1 DD, normal RVSF, mild MR, AV sclerosis without stenosis, small-moderate pericardial effusion   ? ? ?Current Outpatient Medications on File Prior to Visit  ?Medication Sig Dispense Refill  ? furosemide (LASIX) 20 MG tablet Take 1 tablet (20 mg total) by mouth 2 (two) times daily for 5 days, THEN 1 tablet (20 mg total) daily for 24 days. 34 tablet 0  ?  Multiple Vitamin (MULTIVITAMIN WITH MINERALS) TABS tablet Take 1 tablet by mouth every morning.    ? potassium chloride (KLOR-CON M) 10 MEQ tablet Take 1 tablet (10 mEq total) by mouth 2 (two) times daily for 5 days only, THEN 1 tablet (10 mEq total) by mouth daily for 24 days. 30 tablet 0  ? sacubitril-valsartan (ENTRESTO) 24-26 MG Take 1 tablet by mouth 2 (two) times daily. 60 tablet 2  ? ?No current facility-administered medications on file prior to visit.  ? ? ?No Known Allergies ? ?Assessment/Plan: ? ?1. CHF - Patient presents with poorly managed heart failure after  recently stopping all HF meds. She is doing well on low-dose Entresto with BP above goal of <130/80 mmHg. She is euvolemic on exam. Will start spironolactone 12.5 mg once daily, stop daily potassium supplement and check BMP in 2 weeks. Start taking Entresto twice a day. Ideally get her on a beta-blocker eventually if patient is amenable. I will call patient after labs result. Will discuss adding BB at that time. Advised patient to start checking her blood pressure at home to mitigate risk of hypotension with GDMT titration. Reinforced technique on how to use BP cuff for home monitoring, with emphasis on resting before taking measurement. Re-educated on the importance of limiting salt that she's been adding to meals. ? ?Thank you, ? ?Laurey Arrow, PharmD ?PGY1 Pharmacy Resident ?[address], [place], [place] [address]  ?Phone: [phone]; Fax: [phone]  ?

## 2021-10-04 NOTE — Patient Instructions (Addendum)
STOP potassium 10 mEq once daily ?START spironolactone 12.'5mg'$  daily ?CONTINUE Entresto 24/'26mg'$  twice a day and furosemide '20mg'$  daily ? ?To check your pressure at home you will need to: ? ?1. Sit up in a chair, with feet flat on the floor and back supported. Do not cross your ankles ?or legs. ?2. Rest your left arm so that the cuff is about heart level. If the cuff goes on your upper arm,  ?then just relax the arm on the table, arm of the chair or your lap. If you have a wrist cuff, we  ?suggest relaxing your wrist against your chest (think of it as Pledging the Flag with the  ?wrong arm).  ?3. Place the cuff snugly around your arm, about 1 inch above the crook of your elbow. The  ?cords should be inside the groove of your elbow.  ?4. Sit quietly, with the cuff in place, for about 5 minutes. After that 5 minutes press the power  ?button to start a reading. ?5. Do not talk or move while the reading is taking place.  ?6. Record your readings on a sheet of paper. Although most cuffs have a memory, it is often  ?easier to see a pattern developing when the numbers are all in front of you.  ?7. You can repeat the reading after 1-3 minutes if it is recommended ? ?Make sure your bladder is empty and you have not had caffeine or tobacco within the last 30 min ? ?Always bring your blood pressure log with you to your appointments. If you have not brought your monitor in to be double checked for accuracy, please bring it to your next appointment. ? ?You can find a list of validated (accurate) blood pressure cuffs at PopPath.it ? ? ? ?

## 2021-10-04 NOTE — Telephone Encounter (Signed)
Pts husband lost his phone.. states that he was in the third floor waiting room so It may possibly be there as well as the room where his wife was seen... please advise  ?

## 2021-10-08 ENCOUNTER — Ambulatory Visit: Payer: Medicare Other | Admitting: Cardiology

## 2021-10-10 ENCOUNTER — Telehealth: Payer: Self-pay

## 2021-10-10 NOTE — Telephone Encounter (Signed)
Contacted patient, voicemail unavailable. Need to get in touch to schedule lab work (only if getting labs at [facility]) and medication titration (retry bisoprolol - GI upset vs starting Toprol) ?

## 2021-10-10 NOTE — Telephone Encounter (Addendum)
Spoke with patient over the phone. Patient still isn't taking Entresto twice daily as she thought the pharmacist messed up the instructions on the medication label. Instructed her to start taking two times a day. We spoke about methods to help remembering and she said that her husband takes medicines at night and she will remember to take them when he takes his.  ? ?She is still not using her home BP cuff. Patient said she was going out of town tomorrow but agreed to start checking it at home when she returns. Instructed patient to document home readings and bring them with her home cuff to her next visit in May. She is seeing [name] on the 3rd. Scheduled an appointment for the 8th for additional HF med titration and BP check. Can move back if needed depending on changes made at the May 3rd visit. ?

## 2021-10-16 ENCOUNTER — Other Ambulatory Visit: Payer: Medicare Other

## 2021-10-30 ENCOUNTER — Encounter: Payer: Self-pay | Admitting: Physician Assistant

## 2021-10-30 ENCOUNTER — Ambulatory Visit (INDEPENDENT_AMBULATORY_CARE_PROVIDER_SITE_OTHER): Payer: Medicare Other | Admitting: Physician Assistant

## 2021-10-30 ENCOUNTER — Encounter (INDEPENDENT_AMBULATORY_CARE_PROVIDER_SITE_OTHER): Payer: Self-pay

## 2021-10-30 ENCOUNTER — Other Ambulatory Visit: Payer: Medicare Other | Admitting: *Deleted

## 2021-10-30 VITALS — BP 100/50 | HR 77 | Ht 60.0 in | Wt 108.4 lb

## 2021-10-30 DIAGNOSIS — I5023 Acute on chronic systolic (congestive) heart failure: Secondary | ICD-10-CM | POA: Diagnosis not present

## 2021-10-30 DIAGNOSIS — I5022 Chronic systolic (congestive) heart failure: Secondary | ICD-10-CM | POA: Diagnosis not present

## 2021-10-30 DIAGNOSIS — I5020 Unspecified systolic (congestive) heart failure: Secondary | ICD-10-CM

## 2021-10-30 DIAGNOSIS — I10 Essential (primary) hypertension: Secondary | ICD-10-CM

## 2021-10-30 DIAGNOSIS — Z8679 Personal history of other diseases of the circulatory system: Secondary | ICD-10-CM | POA: Diagnosis not present

## 2021-10-30 DIAGNOSIS — Z79899 Other long term (current) drug therapy: Secondary | ICD-10-CM | POA: Diagnosis not present

## 2021-10-30 MED ORDER — METOPROLOL SUCCINATE ER 25 MG PO TB24: 12.5000 mg | ORAL_TABLET | Freq: Every day | ORAL | 3 refills | Status: DC

## 2021-10-30 MED ORDER — ENTRESTO 24-26 MG PO TABS: 1.0000 | ORAL_TABLET | Freq: Two times a day (BID) | ORAL | 3 refills | Status: DC

## 2021-10-30 NOTE — Assessment & Plan Note (Signed)
Blood pressure well controlled on current therapy. ?

## 2021-10-30 NOTE — Assessment & Plan Note (Signed)
She is currently taking Entresto once daily.  She is tolerating spironolactone 12.5 mg daily.  She is currently taking furosemide 20 mg daily.  Her volume status appears stable.  She is NYHA II-IIb.  She has noted rare chest pain that sounds noncardiac.  We again discussed work-up to include repeat stress testing versus cardiac catheterization.  She declines any ischemic testing.  She is leaving for [place] in a couple of weeks and will not be back until the end of September. ?? Increase Entresto back to 24/26 mg twice daily ?? Continue spironolactone 12.5 mg daily ?? BMET today ?? Rechallenge with metoprolol succinate 12.5 mg daily (she can try this after increasing Entresto) ?? She could not tolerate SGLT2 inhibitor in the past ?? Follow-up after returning from [place] ?

## 2021-10-30 NOTE — Patient Instructions (Signed)
Medication Instructions:  ?Your physician has recommended you make the following change in your medication:  ? INCREASE the Entresto to twice a day ? START Toprol 25 mg taking 1/2 tablet in the p.m ? ?*If you need a refill on your cardiac medications before your next appointment, please call your pharmacy* ? ? ?Lab Work: ?TODAY (ordered previously)  BMET ? ?If you have labs (blood work) drawn today and your tests are completely normal, you will receive your results only by: ?MyChart Message (if you have MyChart) OR ?A paper copy in the mail ?If you have any lab test that is abnormal or we need to change your treatment, we will call you to review the results. ? ? ?Testing/Procedures: ?None ordered ? ? ?Follow-Up: ?At [facility], you and your health needs are our priority.  As part of our continuing mission to provide you with exceptional heart care, we have created designated Provider Care Teams.  These Care Teams include your primary Cardiologist (physician) and Advanced Practice Providers (APPs -  Physician Assistants and Nurse Practitioners) who all work together to provide you with the care you need, when you need it. ? ?We recommend signing up for the patient portal called "MyChart".  Sign up information is provided on this After Visit Summary.  MyChart is used to connect with patients for Virtual Visits (Telemedicine).  Patients are able to view lab/test results, encounter notes, upcoming appointments, etc.  Non-urgent messages can be sent to your provider as well.   ?To learn more about what you can do with MyChart, go to [email or web address].   ? ?Your next appointment:   ?April 16, 2022 ? ?The format for your next appointment:   ?In Person ? ?Provider:   ?[name], MD   ? ? ?Other Instructions ? ? ?Important Information About Sugar ? ? ? ? ?  ?

## 2021-10-30 NOTE — Progress Notes (Signed)
?Cardiology Office Note:   ? ?Date:  10/30/2021  ? ?ID:  Sabrina Hernandez, DOB 08-29-1934, MRN [id number] ? ?PCP:  [name], MD  ?[facility] Providers ?Cardiologist:  [name], MD    ?Referring MD: [name],*  ? ?Chief Complaint:  f/u for CHF ?  ? ?Patient Profile: ?(HFrEF) heart failure with reduced ejection fraction  ?Presumed nonischemic cardiomyopathy  ?Pt has declined further eval ?Myoview 4/17 [facility]): EF 28, no ischemia ?Intolerant of metoprolol/ACE inhibitor due to fatigue ?GDMT limited to intol; pt declines Rx  ?Echo 2017: EF 20-25 ?Echo 4/22: EF <20 ?Hypertension  ?GERD ?LBBB ?  ?Prior CV Studies: ?Echocardiogram 10/01/2020 ?EF <20, normal RVSF, trivial pericardial effusion ?  ?Echocardiogram 08/30/2020 ?EF <20, global HK, GR 1 DD, normal RVSF, mild MR, AV sclerosis without stenosis, small-moderate pericardial effusion ? ?  ? ?History of Present Illness:   ?Sabrina Hernandez is a 87 y.o. female with the above problem list.   She was last seen by Dr. Johney Frame in March 2023.  She had stopped taking bisoprolol.  She had worsening volume and her furosemide was adjusted.  She was also placed back on Entresto.  She followed up in the Pharm.D clinic 10/04/21.  She was improved and tolerating Entresto.  She was placed on spironolactone.     ? ?She returns for follow-up.  She is here with her husband.  Overall, she is doing well.  She has rare brief sharp chest pains on the right.  These are not really related to exertion.  She has not had any lower extremity edema.  Her weights have been stable.  She sleeps on 2 pillows chronically.  She has not had significant shortness of breath. ?   ?Past Medical History:  ?Diagnosis Date  ? Diverticulitis   ? HFrEF (heart failure with reduced ejection fraction) ([place]) 07/30/2021  ? Presumed nonischemic cardiomyopathy  Myoview 4/17 [facility]): EF 28, no ischemia Intolerant of metoprolol/ACE inhibitor due to fatigue Echo 2017: EF 20-25  Echocardiogram 10/01/2020:  EF <20, normal RVSF, trivial pericardial effusion Echocardiogram 08/30/2020: EF <20, global HK, GR 1 DD, normal RVSF, mild MR, AV sclerosis without stenosis, small-moderate pericardial effusion   ? ?Current Medications: ?Current Meds  ?Medication Sig  ? furosemide (LASIX) 20 MG tablet Take 20 mg by mouth daily.  ? metoprolol succinate (TOPROL XL) 25 MG 24 hr tablet Take 0.5 tablets (12.5 mg total) by mouth daily.  ? Multiple Vitamin (MULTIVITAMIN WITH MINERALS) TABS tablet Take 1 tablet by mouth every morning.  ? sacubitril-valsartan (ENTRESTO) 24-26 MG Take 1 tablet by mouth 2 (two) times daily.  ? spironolactone (ALDACTONE) 25 MG tablet Take 0.5 tablets (12.5 mg total) by mouth daily.  ? [DISCONTINUED] sacubitril-valsartan (ENTRESTO) 24-26 MG Take 1 tablet by mouth daily.  ?  ?Allergies:   Patient has no known allergies.  ? ?Social History  ? ?Tobacco Use  ? Smoking status: Former  ? Smokeless tobacco: Never  ?Vaping Use  ? Vaping Use: Never used  ?Substance Use Topics  ? Alcohol use: Yes  ?  Comment: social   ? Drug use: No  ?  ?Family Hx: ?The patient's family history includes Alcohol abuse in her father; Early death in her mother; Heart failure in her mother; Mental illness in her father; Stroke in her father. ? ?Review of Systems  ?Gastrointestinal:  Negative for hematochezia.  ?Genitourinary:  Negative for hematuria.   ? ?EKGs/Labs/Other Test Reviewed:   ? ?EKG:  EKG  is not ordered today.  The ekg ordered today demonstrates n/a ? ?Recent Labs: ?07/30/2021: ALT 32; B Natriuretic Peptide 1,277.6; Hemoglobin 14.0; Platelets 190 ?09/20/2021: Magnesium 2.2; NT-Pro BNP 8,788 ?09/27/2021: BUN 16; Creatinine, Ser 1.11; Potassium 4.5; Sodium 141  ? ?Recent Lipid Panel ?No results for input(s): CHOL, TRIG, HDL, VLDL, LDLCALC, LDLDIRECT in the last 8760 hours.  ? ?Risk Assessment/Calculations:   ?  ?    ?Physical Exam:   ? ?VS:  BP (!) 100/50   Pulse 77   Ht 5' (1.524 m)   Wt 108 lb 6.4 oz (49.2  kg)   SpO2 98%   BMI 21.17 kg/m?    ? ?Wt Readings from Last 3 Encounters:  ?10/30/21 108 lb 6.4 oz (49.2 kg)  ?10/04/21 106 lb (48.1 kg)  ?09/20/21 107 lb 6.4 oz (48.7 kg)  ?  ?Constitutional:   ?   Appearance: Healthy appearance. Not in distress.  ?Neck:  ?   Vascular: No JVR. JVD normal.  ?Pulmonary:  ?   Effort: Pulmonary effort is normal.  ?   Breath sounds: No wheezing. No rales.  ?Cardiovascular:  ?   Normal rate. Regular rhythm. Normal S1. Normal S2.   ?   Murmurs: There is no murmur.  ?Edema: ?   Peripheral edema absent.  ?Abdominal:  ?   Palpations: Abdomen is soft. There is no hepatomegaly.  ?Skin: ?   General: Skin is warm and dry.  ?Neurological:  ?   Mental Status: Alert and oriented to person, place and time.  ?   Cranial Nerves: Cranial nerves are intact.  ?  ?    ?ASSESSMENT & PLAN:   ?HFrEF (heart failure with reduced ejection fraction) ([place]) ?She is currently taking Entresto once daily.  She is tolerating spironolactone 12.5 mg daily.  She is currently taking furosemide 20 mg daily.  Her volume status appears stable.  She is NYHA II-IIb.  She has noted rare chest pain that sounds noncardiac.  We again discussed work-up to include repeat stress testing versus cardiac catheterization.  She declines any ischemic testing.  She is leaving for [place] in a couple of weeks and will not be back until the end of September. ?Increase Entresto back to 24/26 mg twice daily ?Continue spironolactone 12.5 mg daily ?BMET today ?Rechallenge with metoprolol succinate 12.5 mg daily (she can try this after increasing Entresto) ?She could not tolerate SGLT2 inhibitor in the past ?Follow-up after returning from [place] ? ?Essential hypertension ?Blood pressure well controlled on current therapy. ? ?     ?   ?Dispo:  Return in about 5 months (around 04/01/2022) for Routine Follow Up, w/ Dr. Johney Frame, or [name], PA-C.  ? ?Medication Adjustments/Labs and Tests Ordered: ?Current medicines are reviewed at length with  the patient today.  Concerns regarding medicines are outlined above.  ?Tests Ordered: ?No orders of the defined types were placed in this encounter. ? ?Medication Changes: ?Meds ordered this encounter  ?Medications  ? sacubitril-valsartan (ENTRESTO) 24-26 MG  ?  Sig: Take 1 tablet by mouth 2 (two) times daily.  ?  Dispense:  180 tablet  ?  Refill:  3  ? metoprolol succinate (TOPROL XL) 25 MG 24 hr tablet  ?  Sig: Take 0.5 tablets (12.5 mg total) by mouth daily.  ?  Dispense:  90 tablet  ?  Refill:  3  ? ?Signed, ?[name], PA-C  ?10/30/2021 3:07 PM    ?[facility] [address],  [place], [place]  [address] ?Phone: [phone]; Fax: [phone]  ?

## 2021-10-31 LAB — BASIC METABOLIC PANEL
BUN/Creatinine Ratio: 18 (ref 12–28)
BUN: 17 mg/dL (ref 8–27)
CO2: 25 mmol/L (ref 20–29)
Calcium: 9.8 mg/dL (ref 8.7–10.3)
Chloride: 103 mmol/L (ref 96–106)
Creatinine, Ser: 0.93 mg/dL (ref 0.57–1.00)
Glucose: 80 mg/dL (ref 70–99)
Potassium: 4.6 mmol/L (ref 3.5–5.2)
Sodium: 144 mmol/L (ref 134–144)
eGFR: 61 mL/min/{1.73_m2} (ref >59)

## 2021-11-01 NOTE — Progress Notes (Unsigned)
Patient ID: Sabrina Hernandez                 DOB: 1934-12-20                      MRN: [id number] ? ? ? ? ?HPI: ?Sabrina Hernandez is a 87 y.o. female referred by Dr. Johney Frame to pharmacy clinic for HF medication management. PMH is significant for HTN, HF and GERD. Most recent LVEF <20% on 10/01/2020. Today she returns to pharmacy clinic with her husband for further medication titration. At Feb 2023 visit with [name], patient had stopped taking her HF medications.  Patient denies invasive work-up and interventions, only wants medical management. At visit with Nicki Reaper, she was restarted on her diuretic and low-dose bisoprolol but experienced loss of bowel control. She was started on Entresto 24/'26mg'$  BID at appointment with Dr. Johney Frame in March, which she was only taking once daily at pharmacist follow-up on 10/04/21. Pt was instructed to take Entresto 24/26 mg BID and start spironolactone 12.5 mg daily. At visit with Nicki Reaper on 10/30/21, she was still taking Entresto once daily but tolerating spironolactone. She was taking furosemide 20 mg daily. Discussed potentially re-challenging with metoprolol. Clinic BP on 10/30/21 was 100/50, pulse 77. Of note, She is leaving for [place] in a couple of weeks, and will not be back until the end of September. ? ?Questions ?- Bring BP cuff to clinic for validation ?- Check BP, med changes  ?- Last BMP (10/30/21)-> K a bit on the rise, still wnl (taking. CrCl ~32, watch spiro dose). Verify stopped Potassium 10 meq daily (per telephone note 4/7) ?- preferred to restart BB (metop) rather than titrate antihypertensive meds given lower pressures and normal pulse ? ? ?OLD HPI:  ?Today she presents to pharmacy clinic with her husband for further medication titration. She said that she stopped her medications previously because she didn't feel like taking the medication. She also reports that doesn't care about living longer, but does want to stay our of the hospital. ?Home weights stable - 103 lb  - and 106 lb in clinic today. Symptomatically, she is feeling well. Denies dizziness, lightheadedness. Has some fatigue that patient attributes to her age. Denies chest pain or palpitations. No LEE or SOB. Sleeps with 1 pillow. Able to complete all ADLs. Activity level mild/moderate. She regularly checks her weight at home (normal range 102 - 106 lbs). Appetite has been normal. She used to adhere to a low-salt diet but reports that she adds salt to most meals. Patient has a BP cuff at home but doesn't use it. Clinic BP readings are 145/70, 138/76 mmHg. ?She reports that her bowel issues have not stopped since stopping bisoprolol. Is only taking Entresto once a day, did not realize it was twice a day.  ? ?Current CHF meds:  ?Entresto 24/26 mg BID ?Spironolactone 12.5 mg daily  ?Lasix 20 mg daily ?Potassium 10 mEq daily (stopped 4/7, verify) ? ?Previously tried: Bisoprolol (GI upset), SGLT2i (symptoms?) ?BP goal: <130/80 mmHg ? ?Family History: Heart failure - mother; stroke - father ? ?Social History: former tobacco use, alcohol use, no drug use ? ?Diet: adds salts to meals ? ?Exercise: somewhat active  ? ?Home BP readings: none ? ?Wt Readings from Last 3 Encounters:  ?10/30/21 108 lb 6.4 oz (49.2 kg)  ?10/04/21 106 lb (48.1 kg)  ?09/20/21 107 lb 6.4 oz (48.7 kg)  ? ?BP Readings from Last 3 Encounters:  ?10/30/21 (!) 100/50  ?10/04/21  138/75  ?09/20/21 126/80  ? ?Pulse Readings from Last 3 Encounters:  ?10/30/21 77  ?10/04/21 77  ?09/20/21 76  ? ? ?Renal function: ?Estimated Creatinine Clearance: 32.3 mL/min (by C-G formula based on SCr of 0.93 mg/dL). ? ?Past Medical History:  ?Diagnosis Date  ? Diverticulitis   ? HFrEF (heart failure with reduced ejection fraction) ([place]) 07/30/2021  ? Presumed nonischemic cardiomyopathy  Myoview 4/17 [facility]): EF 28, no ischemia Intolerant of metoprolol/ACE inhibitor due to fatigue Echo 2017: EF 20-25 Echocardiogram 10/01/2020:  EF <20, normal RVSF, trivial pericardial effusion  Echocardiogram 08/30/2020: EF <20, global HK, GR 1 DD, normal RVSF, mild MR, AV sclerosis without stenosis, small-moderate pericardial effusion   ? ? ?Current Outpatient Medications on File Prior to Visit  ?Medication Sig Dispense Refill  ? furosemide (LASIX) 20 MG tablet Take 20 mg by mouth daily.    ? metoprolol succinate (TOPROL XL) 25 MG 24 hr tablet Take 0.5 tablets (12.5 mg total) by mouth daily. 90 tablet 3  ? Multiple Vitamin (MULTIVITAMIN WITH MINERALS) TABS tablet Take 1 tablet by mouth every morning.    ? sacubitril-valsartan (ENTRESTO) 24-26 MG Take 1 tablet by mouth 2 (two) times daily. 180 tablet 3  ? spironolactone (ALDACTONE) 25 MG tablet Take 0.5 tablets (12.5 mg total) by mouth daily. 45 tablet 3  ? ?No current facility-administered medications on file prior to visit.  ? ? ?No Known Allergies ? ?Assessment/Plan: ? ?1. CHF - Patient presents with poorly managed heart failure after recently stopping all HF meds. She is doing well on low-dose Entresto and spironolactone with BP ------ above goal of <130/80 mmHg. She is hypo/hyper/euvolemic on exam. *Start taking Entresto twice a day*. Ideally get her on a beta-blocker (metoprolol 12.5 mg) eventually if patient is amenable. I will call patient after labs result. Will discuss adding BB at that time. Advised patient to start checking her blood pressure at home to mitigate risk of hypotension with GDMT titration. Reinforced technique on how to use BP cuff for home monitoring, with emphasis on resting before taking measurement. Re-educated on the importance of limiting salt that she's been adding to meals. ? ?Thank you, ? ?Laurey Arrow, PharmD ?PGY1 Pharmacy Resident ?[facility] [place], [place] [address]  ?Phone: [phone]; Fax: [phone]  ?

## 2021-11-04 ENCOUNTER — Ambulatory Visit: Payer: Medicare Other

## 2022-01-08 ENCOUNTER — Other Ambulatory Visit: Payer: Self-pay | Admitting: Physician Assistant

## 2022-01-08 DIAGNOSIS — K5730 Diverticulosis of large intestine without perforation or abscess without bleeding: Secondary | ICD-10-CM | POA: Diagnosis not present

## 2022-01-08 DIAGNOSIS — R1013 Epigastric pain: Secondary | ICD-10-CM | POA: Diagnosis not present

## 2022-01-08 DIAGNOSIS — I509 Heart failure, unspecified: Secondary | ICD-10-CM | POA: Diagnosis not present

## 2022-01-08 DIAGNOSIS — Z87891 Personal history of nicotine dependence: Secondary | ICD-10-CM | POA: Diagnosis not present

## 2022-01-08 DIAGNOSIS — I110 Hypertensive heart disease with heart failure: Secondary | ICD-10-CM | POA: Diagnosis not present

## 2022-01-08 DIAGNOSIS — R197 Diarrhea, unspecified: Secondary | ICD-10-CM | POA: Diagnosis not present

## 2022-01-08 DIAGNOSIS — Z79899 Other long term (current) drug therapy: Secondary | ICD-10-CM | POA: Diagnosis not present

## 2022-04-13 NOTE — Progress Notes (Deleted)
Cardiology Office Note:    Date:  04/13/2022   ID:  Sabrina Hernandez, DOB 06-Apr-1935, MRN [id number]  PCP:  [name], MD   [place]  Cardiologist:  [name], MD  Advanced Practice Provider:  No care team member to display Electrophysiologist:  None    Referring MD: [name],*     History of Present Illness:    Sabrina Hernandez is a 87 y.o. female with a hx of HTN, GERD and systolic heart failure who presents to clinic for follow-up of her HFrEF.   She was initially diagnosed with HFrEF in 2017 when she presented with worsening SOB. Was seen at [facility]. Declined cath at that time but myoview was negative for ischemia. She was initially on metop/ACE but these were later stopped due to fatigue. She has not followed regularly with a cardiologist. Last TTE was in 2017. No ICD in place.    Was seen in [facility] ED on 08/02/20 with worsening DOE over the past couple of months as well as orthopnea. In the ED, ECG with NSR, LAE, LBBB. LVH. TTE with LVEF 20-25% in 2017. BNP 1145. Trop 49>>53. COVID negative. She was diuresed with lasix IV with good response. She was discharged home with plans to follow-up with Cardiology for further management.   During our visit 08/09/20 the patient was doing better but continued to have SOB and orthopnea. BNP 4251. We resumed diuresis with lasix '20mg'$  BID.   During our visit on 08/24/20, the patient reported having black stools. On ASA but not AC. We stopped her ASA and referred to GI. CBC with HgB 15.1.  TTE on 08/29/20 with LVEF <20% with global hypokinesis. G1DD, mild MR, small to moderate pericardial effusion. Patient has adamently declined any invasive testing.  Had recurrent ER visit on 06/2021 with decompensated HF in the setting of not taking her lasix. BNP 1277 and CXR with pulmonary edema. She was given IV lasix and recommended for admission but she declined and wanted to go home.  Last saw  [name] on 07/2021. She was feeling better but had stopped most of her HF medications.  Was seen in clinic by me on 08/2021 where she again had stopped her medications as she ran out of them. She was stable from a CV standpoint. We resumed her meds at that time.  Saw [name] on 10/30/21 where she was doing well. Wts stable.   Today. ***   Past Medical History:  Diagnosis Date   Diverticulitis    HFrEF (heart failure with reduced ejection fraction) ([place]) 07/30/2021   Presumed nonischemic cardiomyopathy  Myoview 4/17 ([facility]): EF 28, no ischemia Intolerant of metoprolol/ACE inhibitor due to fatigue Echo 2017: EF 20-25 Echocardiogram 10/01/2020:  EF <20, normal RVSF, trivial pericardial effusion Echocardiogram 08/30/2020: EF <20, global HK, GR 1 DD, normal RVSF, mild MR, AV sclerosis without stenosis, small-moderate pericardial effusion     Past Surgical History:  Procedure Laterality Date   CERVICAL CONE BIOPSY     HAMMER TOE SURGERY     right foot    ORIF PATELLA Left 01/29/2015   Procedure: OPEN REDUCTION INTERNAL (ORIF) FIXATION LEFT PATELLA;  Surgeon: Paralee Cancel, MD;  Location: [facility] ORS;  Service: Orthopedics;  Laterality: Left;    Current Medications: No outpatient medications have been marked as taking for the 04/16/22 encounter (Appointment) with [name], MD.     Allergies:   Patient has no known allergies.  Social History   Socioeconomic History   Marital status: Married    Spouse name: Sabrina Hernandez   Number of children: 5   Years of education: Not on file   Highest education level: Not on file  Occupational History   Occupation: Teacher/instructor-retired    Fish farm manager: [employer]  Tobacco Use   Smoking status: Former   Smokeless tobacco: Never  Scientific laboratory technician Use: Never used  Substance and Sexual Activity   Alcohol use: Yes    Comment: social    Drug use: No   Sexual activity: Not on file  Other Topics Concern   Not on file   Social History Narrative   She is from [place], [place] originally but has lived in [place] for 30 years. She would like to move back. She is married with 5 grown children (3 in [place] and 2 in [place]. She is a retired Psychologist, prison and probation services.   Social Determinants of Health   Financial Resource Strain: Not on file  Food Insecurity: Not on file  Transportation Needs: Not on file  Physical Activity: Not on file  Stress: Not on file  Social Connections: Not on file     Family History: The patient's family history includes Alcohol abuse in her father; Early death in her mother; Heart failure in her mother; Mental illness in her father; Stroke in her father.  ROS:   Please see the history of present illness.    Review of Systems  Constitutional:  Negative for chills and fever.  HENT:  Negative for hearing loss.   Eyes:  Negative for blurred vision and redness.  Respiratory:  Negative for shortness of breath.   Cardiovascular:  Negative for chest pain, palpitations, orthopnea, claudication, leg swelling and PND.  Gastrointestinal:  Negative for melena, nausea and vomiting.  Genitourinary:  Negative for dysuria.  Musculoskeletal:  Positive for joint pain. Negative for falls.  Neurological:  Negative for dizziness and loss of consciousness.  Endo/Heme/Allergies:  Negative for polydipsia.  Psychiatric/Behavioral:  Negative for substance abuse.     EKGs/Labs/Other Studies Reviewed:    The following studies were reviewed today: TTE 2020/09/19:  1. Left ventricular ejection fraction, by estimation, is <20%. The left  ventricle has severely decreased function. The left ventricle demonstrates  global hypokinesis. The left ventricular internal cavity size was mildly  dilated. Left ventricular  diastolic parameters are consistent with Grade I diastolic dysfunction  (impaired relaxation). Elevated left ventricular end-diastolic pressure.   2. Right ventricular systolic function is normal. The right  ventricular  size is normal. Tricuspid regurgitation signal is inadequate for assessing  PA pressure.   3. The mitral valve is degenerative. Mild mitral valve regurgitation. No  evidence of mitral stenosis.   4. The aortic valve is tricuspid. Aortic valve regurgitation is not  visualized. Mild aortic valve sclerosis is present, with no evidence of  aortic valve stenosis.   5. The inferior vena cava is normal in size with greater than 50%  respiratory variability, suggesting right atrial pressure of 3 mmHg.   6. A small to moderate pericardial effusion is present. The pericardial  effusion is circumferential with no evidence of pericardial tamponade.  Myoview 2017: 1. No clear evidence for reversibility or ischemia..   2. Dilated left ventricle with diffuse hypokinesia and paradoxical  motion along the septal wall.   3. Left ventricular ejection fraction is 28%.   4. High-risk stress test findings*.    TTE 2017: Findings  Mitral Valve  Structurally normal mitral valve.  Mild to moderate mitral regurgitation by color flow doppler examination.  Aortic Valve  Structurally normal aortic valve with good leaflet mobility, and no  regurgitation by color flow doppler examination.  Tricuspid Valve  Tricuspid valve is structurally normal. Trace tricuspid regurgitation by color  flow doppler examination. Normal estimated pulmonary artery pressure  Pulmonic Valve  Pulmonic valve is structurally normal.  Mild pulmonic regurgitation present by color flow doppler examination.  No evidence of pulmonic stenosis.  Left Atrium  Left atrial volume index of 38.1 ml per meters squared BSA.  Moderately dilated left atrium.  Left Ventricle  Mildly dilated left ventricle.  Severe global LV hypokinesis.  Right Atrium  Normal right atrium.  Right Ventricle  Normal right ventricle structure and function.  Pericardial Effusion  No evidence of pericardial effusion.  Miscellaneous  The aorta is  within normal limits.  The IVC is normal   EKG:  EKG 08/02/20: NSR, LBBB, lateral q waves  Recent Labs: 07/30/2021: ALT 32; B Natriuretic Peptide 1,277.6; Hemoglobin 14.0; Platelets 190 09/20/2021: Magnesium 2.2; NT-Pro BNP 8,788 10/30/2021: BUN 17; Creatinine, Ser 0.93; Potassium 4.6; Sodium 144  Recent Lipid Panel No results found for: "CHOL", "TRIG", "HDL", "CHOLHDL", "VLDL", "LDLCALC", "LDLDIRECT"   Physical Exam:    VS:  There were no vitals taken for this visit.    Wt Readings from Last 3 Encounters:  10/30/21 108 lb 6.4 oz (49.2 kg)  10/04/21 106 lb (48.1 kg)  09/20/21 107 lb 6.4 oz (48.7 kg)     GEN:  Well nourished, well developed in no acute distress HEENT: Normal NECK: No JVD; No carotid bruits CARDIAC: RRR, no murmurs, rubs, gallops RESPIRATORY:  Clear to auscultation without rales, wheezing or rhonchi  ABDOMEN: Soft, non-tender, non-distended MUSCULOSKELETAL:  No edema; No deformity  SKIN: Warm and dry NEUROLOGIC:  Alert and oriented x 3 PSYCHIATRIC:  Normal affect   ASSESSMENT:    No diagnosis found.  PLAN:    In order of problems listed above:  #Acute on chronic systolic heart failure: LVEF <20%. Suspected non-ischemic CM with negative myoview in 2017, however, has not had cath. Recent acute decompensation with volume overload and elevated BNP. Currently improved with NYHA class II-III symptoms today with mild volume overload on exam. Declines any invasive work-up or intervention at this time. Only wants to proceed with medical management. Notably, she stopped all HF medications for unclear reasons. Will try to add back as tolerated.  -TTE with LVEF <20%, G1DD -Continue lasix '20mg'$  daily -Did not tolerate bisoprolol due to GI upset -Continue entresto 24/'26mg'$  BID  -Continue metop '25mg'$  XL daily -Continue spiro 12.'5mg'$  daily -Declined RHC/LHC or an invasive work-up event if shortens lifespan; wants only medical management -Notably leaves for [place] in May;  will need meds PRIOR to her leaving  #Small to moderate pericardial effusion: Trivial on TTE 09/2020 -Continue to monitor   #HTN: Well controlled. -Continue entresto 24/'26mg'$  BID  -Continue metop '25mg'$  XL daily -Continue spiro 12.'5mg'$  daily      Medication Adjustments/Labs and Tests Ordered: Current medicines are reviewed at length with the patient today.  Concerns regarding medicines are outlined above.  No orders of the defined types were placed in this encounter.  No orders of the defined types were placed in this encounter.   There are no Patient Instructions on file for this visit.    Signed, [name], MD  04/13/2022 2:41 PM    [place]

## 2022-04-16 ENCOUNTER — Ambulatory Visit: Payer: Medicare Other | Admitting: Cardiology

## 2022-04-21 DIAGNOSIS — H02831 Dermatochalasis of right upper eyelid: Secondary | ICD-10-CM | POA: Diagnosis not present

## 2022-04-21 DIAGNOSIS — H0102A Squamous blepharitis right eye, upper and lower eyelids: Secondary | ICD-10-CM | POA: Diagnosis not present

## 2022-04-21 DIAGNOSIS — H25813 Combined forms of age-related cataract, bilateral: Secondary | ICD-10-CM | POA: Diagnosis not present

## 2022-04-21 DIAGNOSIS — H0102B Squamous blepharitis left eye, upper and lower eyelids: Secondary | ICD-10-CM | POA: Diagnosis not present

## 2022-04-21 DIAGNOSIS — H11823 Conjunctivochalasis, bilateral: Secondary | ICD-10-CM | POA: Diagnosis not present

## 2022-04-21 DIAGNOSIS — H02834 Dermatochalasis of left upper eyelid: Secondary | ICD-10-CM | POA: Diagnosis not present

## 2022-04-21 DIAGNOSIS — H04123 Dry eye syndrome of bilateral lacrimal glands: Secondary | ICD-10-CM | POA: Diagnosis not present

## 2022-05-06 ENCOUNTER — Encounter (HOSPITAL_BASED_OUTPATIENT_CLINIC_OR_DEPARTMENT_OTHER): Payer: Self-pay

## 2022-05-06 ENCOUNTER — Other Ambulatory Visit: Payer: Self-pay

## 2022-05-06 ENCOUNTER — Emergency Department (HOSPITAL_BASED_OUTPATIENT_CLINIC_OR_DEPARTMENT_OTHER): Payer: Medicare Other

## 2022-05-06 ENCOUNTER — Emergency Department (HOSPITAL_BASED_OUTPATIENT_CLINIC_OR_DEPARTMENT_OTHER)
Admission: EM | Admit: 2022-05-06 | Discharge: 2022-05-06 | Disposition: A | Discharge status: Home / Self Care | Payer: Medicare Other | Attending: Emergency Medicine | Admitting: Emergency Medicine

## 2022-05-06 DIAGNOSIS — R1032 Left lower quadrant pain: Secondary | ICD-10-CM | POA: Diagnosis not present

## 2022-05-06 DIAGNOSIS — E871 Hypo-osmolality and hyponatremia: Secondary | ICD-10-CM | POA: Insufficient documentation

## 2022-05-06 DIAGNOSIS — R109 Unspecified abdominal pain: Secondary | ICD-10-CM | POA: Diagnosis present

## 2022-05-06 DIAGNOSIS — K5732 Diverticulitis of large intestine without perforation or abscess without bleeding: Secondary | ICD-10-CM | POA: Diagnosis not present

## 2022-05-06 DIAGNOSIS — K529 Noninfective gastroenteritis and colitis, unspecified: Secondary | ICD-10-CM | POA: Diagnosis not present

## 2022-05-06 DIAGNOSIS — K5792 Diverticulitis of intestine, part unspecified, without perforation or abscess without bleeding: Secondary | ICD-10-CM | POA: Diagnosis not present

## 2022-05-06 DIAGNOSIS — R197 Diarrhea, unspecified: Secondary | ICD-10-CM

## 2022-05-06 DIAGNOSIS — I5020 Unspecified systolic (congestive) heart failure: Secondary | ICD-10-CM | POA: Diagnosis not present

## 2022-05-06 DIAGNOSIS — I700 Atherosclerosis of aorta: Secondary | ICD-10-CM | POA: Diagnosis not present

## 2022-05-06 LAB — COMPREHENSIVE METABOLIC PANEL
ALT: 14 U/L (ref 0–44)
AST: 22 U/L (ref 15–41)
Albumin: 3.7 g/dL (ref 3.5–5.0)
Alkaline Phosphatase: 69 U/L (ref 38–126)
Anion gap: 8 (ref 5–15)
BUN: 13 mg/dL (ref 8–23)
CO2: 25 mmol/L (ref 22–32)
Calcium: 8.7 mg/dL — ABNORMAL LOW (ref 8.9–10.3)
Chloride: 101 mmol/L (ref 98–111)
Creatinine, Ser: 1.08 mg/dL — ABNORMAL HIGH (ref 0.44–1.00)
GFR, Estimated: 50 mL/min — ABNORMAL LOW (ref >60)
Glucose, Bld: 105 mg/dL — ABNORMAL HIGH (ref 70–99)
Potassium: 3.6 mmol/L (ref 3.5–5.1)
Sodium: 134 mmol/L — ABNORMAL LOW (ref 135–145)
Total Bilirubin: 3.1 mg/dL — ABNORMAL HIGH (ref 0.3–1.2)
Total Protein: 7.1 g/dL (ref 6.5–8.1)

## 2022-05-06 LAB — LIPASE, BLOOD: Lipase: 27 U/L (ref 11–51)

## 2022-05-06 LAB — CBC
HCT: 38.5 % (ref 36.0–46.0)
Hemoglobin: 12.9 g/dL (ref 12.0–15.0)
MCH: 30.9 pg (ref 26.0–34.0)
MCHC: 33.5 g/dL (ref 30.0–36.0)
MCV: 92.3 fL (ref 80.0–100.0)
Platelets: 228 10*3/uL (ref 150–400)
RBC: 4.17 MIL/uL (ref 3.87–5.11)
RDW: 12.7 % (ref 11.5–15.5)
WBC: 9.7 10*3/uL (ref 4.0–10.5)
nRBC: 0 % (ref 0.0–0.2)

## 2022-05-06 LAB — C DIFFICILE QUICK SCREEN W PCR REFLEX
C Diff antigen: NEGATIVE
C Diff interpretation: NOT DETECTED
C Diff toxin: NEGATIVE

## 2022-05-06 MED ORDER — LOPERAMIDE HCL 2 MG PO CAPS: 2.0000 mg | ORAL_CAPSULE | Freq: Two times a day (BID) | ORAL | 0 refills | Status: DC | PRN

## 2022-05-06 MED ORDER — AMOXICILLIN-POT CLAVULANATE 875-125 MG PO TABS: 1.0000 | ORAL_TABLET | Freq: Two times a day (BID) | ORAL | 0 refills | Status: DC

## 2022-05-06 MED ORDER — IOHEXOL 300 MG/ML  SOLN
100.0000 mL | Freq: Once | INTRAMUSCULAR | Status: AC | PRN
  Administered 2022-05-06: 100 mL via INTRAVENOUS

## 2022-05-06 MED ORDER — LOPERAMIDE HCL 2 MG PO CAPS
2.0000 mg | ORAL_CAPSULE | Freq: Once | ORAL | Status: AC
  Administered 2022-05-06: 2 mg via ORAL
  Filled 2022-05-06: qty 1

## 2022-05-06 MED ORDER — LACTATED RINGERS IV BOLUS
250.0000 mL | Freq: Once | INTRAVENOUS | Status: AC
  Administered 2022-05-06: 250 mL via INTRAVENOUS

## 2022-05-06 MED ORDER — AMOXICILLIN-POT CLAVULANATE 875-125 MG PO TABS
1.0000 | ORAL_TABLET | Freq: Once | ORAL | Status: AC
  Administered 2022-05-06: 1 via ORAL
  Filled 2022-05-06: qty 1

## 2022-05-06 MED ORDER — ONDANSETRON 4 MG PO TBDP: 4.0000 mg | ORAL_TABLET | Freq: Three times a day (TID) | ORAL | 0 refills | Status: DC | PRN

## 2022-05-06 NOTE — ED Triage Notes (Signed)
C/o diarrhea intermittently for about a month. Denies abdominal pain/N/V.

## 2022-05-06 NOTE — ED Provider Notes (Signed)
[facility] EMERGENCY DEPARTMENT Provider Note   CSN: [id number] Arrival date & time: 05/06/22  0815     History  Chief Complaint  Patient presents with   Diarrhea    Sabrina Hernandez is a 87 y.o. female.  With PMH HFrEF, GERD who presents with diarrhea and abdominal pain.  Patient said she travel to [place] about a month ago when this first started and she had persistent diarrhea which eventually slowed down.  She was supposed to give a stool sample but never provided 1.  Then last night she started having bowel movements every 5 minutes.  She says they are clear and brown and nonbloody.  Her only recent travel was [place].  She has had no recent antibiotics.  She has had no change in her medications, no fevers, no vomiting.  She has had some discomfort in her abdomen in the epigastrium and left side.  She has had no urinary symptoms.  She has not tried Imodium.  She has never had a colonoscopy but does endorse history of diverticulitis.   Diarrhea      Home Medications Prior to Admission medications   Medication Sig Start Date End Date Taking? Authorizing Provider  amoxicillin-clavulanate (AUGMENTIN) 875-125 MG tablet Take 1 tablet by mouth every 12 (twelve) hours. 05/06/22  Yes Elgie Congo, MD  loperamide (IMODIUM) 2 MG capsule Take 1 capsule (2 mg total) by mouth every 12 (twelve) hours as needed for diarrhea or loose stools (If having >4 stools, take pill/). 05/06/22  Yes Elgie Congo, MD  ondansetron (ZOFRAN-ODT) 4 MG disintegrating tablet Take 1 tablet (4 mg total) by mouth every 8 (eight) hours as needed for nausea or vomiting. 05/06/22  Yes Elgie Congo, MD  furosemide (LASIX) 20 MG tablet TAKE 1 TABLET(20 MG) BY MOUTH DAILY 01/08/22   [name], MD  metoprolol succinate (TOPROL XL) 25 MG 24 hr tablet Take 0.5 tablets (12.5 mg total) by mouth daily. 10/30/21   [name] T, PA-C  Multiple Vitamin (MULTIVITAMIN WITH MINERALS) TABS tablet  Take 1 tablet by mouth every morning.    [provider]  sacubitril-valsartan (ENTRESTO) 24-26 MG Take 1 tablet by mouth 2 (two) times daily. 10/30/21   [name] T, PA-C  spironolactone (ALDACTONE) 25 MG tablet Take 0.5 tablets (12.5 mg total) by mouth daily. 10/04/21   [name], MD      Allergies    Patient has no known allergies.    Review of Systems   Review of Systems  Gastrointestinal:  Positive for diarrhea.    Physical Exam Updated Vital Signs BP (!) 130/52   Pulse 81   Temp 98.2 F (36.8 C) (Oral)   Resp 18   Ht 5' (1.524 m)   Wt 46.7 kg   SpO2 97%   BMI 20.12 kg/m  Physical Exam Constitutional: Alert and oriented. Well appearing and in no distress. Eyes: Conjunctivae are normal. ENT      Head: Normocephalic and atraumatic.      Nose: No congestion.      Mouth/Throat: Mucous membranes are moist.      Neck: No stridor. Cardiovascular: S1, S2, mildly tachycardic, regular rhythm Respiratory: Normal respiratory effort. Breath sounds are normal. Gastrointestinal: Soft and nondistended with mild epigastrium, LUQ and LLQ ttp, no rebound or guarding. Musculoskeletal: Normal range of motion in all extremities. No pitting edema of the lower extremities Neurologic: Normal speech and language. No gross focal neurologic deficits are appreciated. Skin: Skin  is warm, dry and intact. No rash noted. Psychiatric: Mood and affect are normal. Speech and behavior are normal.  ED Results / Procedures / Treatments   Labs (all labs ordered are listed, but only abnormal results are displayed) Labs Reviewed  COMPREHENSIVE METABOLIC PANEL - Abnormal; Notable for the following components:      Result Value   Sodium 134 (*)    Glucose, Bld 105 (*)    Creatinine, Ser 1.08 (*)    Calcium 8.7 (*)    Total Bilirubin 3.1 (*)    GFR, Estimated 50 (*)    All other components within normal limits  GASTROINTESTINAL PANEL BY PCR, STOOL (REPLACES STOOL CULTURE)  C  DIFFICILE QUICK SCREEN W PCR REFLEX    LIPASE, BLOOD  CBC  URINALYSIS, ROUTINE W REFLEX MICROSCOPIC    EKG None  Radiology CT ABDOMEN PELVIS W CONTRAST  Result Date: 05/06/2022 CLINICAL DATA:  Diarrhea.  Left lower quadrant abdominal pain. EXAM: CT ABDOMEN AND PELVIS WITH CONTRAST TECHNIQUE: Multidetector CT imaging of the abdomen and pelvis was performed using the standard protocol following bolus administration of intravenous contrast. RADIATION DOSE REDUCTION: This exam was performed according to the departmental dose-optimization program which includes automated exposure control, adjustment of the mA and/or kV according to patient size and/or use of iterative reconstruction technique. CONTRAST:  143m OMNIPAQUE IOHEXOL 300 MG/ML  SOLN COMPARISON:  07/09/2008 FINDINGS: Lower chest: Lung bases are clear.  Some degree of cardiomegaly. Hepatobiliary: Liver parenchyma is normal.  No calcified gallstones. Pancreas: Normal Spleen: Normal Adrenals/Urinary Tract: Adrenal glands are normal. Kidneys are normal. No stone or hydronephrosis. Bladder is collapsed but normal. Stomach/Bowel: Stomach and small intestine are normal. Normal appendix. There is a colitis pattern affecting the sigmoid colon, presumably secondary to acute diverticulitis. Other forms of colitis are considered but felt less likely. There is surrounding edema but no frank abscess. No free fluid or free air. Vascular/Lymphatic: Aortic atherosclerosis. No aneurysm. IVC is normal. No adenopathy. Reproductive: Normal.  No pelvic mass. Other: No hernia. Musculoskeletal: Scoliosis and degenerative changes of the lumbar spine. IMPRESSION: 1. Colitis pattern affecting the sigmoid colon, presumably secondary to acute diverticulitis. Other forms of colitis are considered but felt less likely. There is surrounding edema but no frank abscess. No free fluid or free air. 2. Aortic atherosclerosis. 3. Scoliosis and degenerative changes of the lumbar spine.  Aortic Atherosclerosis (ICD10-I70.0). Electronically Signed   By: MNelson ChimesM.D.   On: 05/06/2022 10:59    Procedures Procedures    Medications Ordered in ED Medications  amoxicillin-clavulanate (AUGMENTIN) 875-125 MG per tablet 1 tablet (has no administration in time range)  lactated ringers bolus 250 mL (250 mLs Intravenous New Bag/Given 05/06/22 1006)  iohexol (OMNIPAQUE) 300 MG/ML solution 100 mL (100 mLs Intravenous Contrast Given 05/06/22 1013)    ED Course/ Medical Decision Making/ A&P                           Medical Decision Making  CShandale Malakis a 87y.o. female.  With PMH HFrEF, GERD who presents with diarrhea and abdominal pain.  Patient presents with intermittent nonbloody diarrhea with associated abdominal discomfort over the past month.  Differential includes but not limited to functional diarrhea, infectious diarrhea, enteritis, colitis or possible diverticulitis.  Her labs were obtained with a creatinine 1.08 slightly elevated from previous but only mild hyponatremia 134.  Normal potassium 3.6.  Total bilirubin 3.1 but no right upper quadrant  tenderness no vomiting no inability tolerate p.o., unlikely biliary pathology such as cholelithiasis or cholecystitis.  Normal white blood cell count 9.7.  Hemoglobin 12.9.  Lipase 27 within normal limits, no concern for pancreatitis.  CTAP with IV contrast which I personally reviewed and did not visualize free air.  Radiologist read as colitis within the sigmoid colon concerning for diverticulitis.  There was no free air or free fluid or evidence of abscess.  Patient was given 250 cc IV fluids due to history of heart failure and first dose of Augmentin.  Since she is tolerating p.o., well-appearing, hemodynamically stable and no evidence of complicated diverticulitis, will discharge with 7 days of Augmentin and as needed Zofran.  Advised oral hydration and close follow-up with PCP.  Also put in referral to GI specialist.   Strict return precautions discussed.  Patient is in agreement with plan and safe for discharge.  Amount and/or Complexity of Data Reviewed Labs: ordered. Radiology: ordered.  Risk Prescription drug management.    Final Clinical Impression(s) / ED Diagnoses Final diagnoses:  Diarrhea, unspecified type  Colitis  Diverticulitis    Rx / DC Orders ED Discharge Orders          Ordered    Ambulatory referral to Gastroenterology        05/06/22 1117    amoxicillin-clavulanate (AUGMENTIN) 875-125 MG tablet  Every 12 hours        05/06/22 1122    ondansetron (ZOFRAN-ODT) 4 MG disintegrating tablet  Every 8 hours PRN        05/06/22 1122    loperamide (IMODIUM) 2 MG capsule  Every 12 hours PRN        05/06/22 1127              Elgie Congo, MD 05/06/22 1131

## 2022-05-06 NOTE — Discharge Instructions (Addendum)
You were seen for abdominal pain and diarrhea.  The CT scan of your abdomen showed evidence of inflammation of your colon likely due to diverticulitis.  Take the antibiotics as prescribed.  You have also given a prescription for Zofran to take as needed for nausea or vomiting.  Make sure to drink fluids and take Tylenol and ibuprofen as needed for pain.  If you are having 4 more bowel movements, you can take an Imodium as needed.  Make an appointment with your primary care doctor regarding your visit to the ER today and with the GI specialist.  Come back if you have any severe worsening abdominal pain, high fevers, inability to eat any food or keep any fluid down, or any other symptoms concerning to you.  Your MyChart will give you results of your stool samples in the next few days.

## 2022-05-06 NOTE — ED Notes (Signed)
Pt up to poop another time

## 2022-05-07 DIAGNOSIS — Z85828 Personal history of other malignant neoplasm of skin: Secondary | ICD-10-CM | POA: Diagnosis not present

## 2022-05-07 DIAGNOSIS — L814 Other melanin hyperpigmentation: Secondary | ICD-10-CM | POA: Diagnosis not present

## 2022-05-07 DIAGNOSIS — L820 Inflamed seborrheic keratosis: Secondary | ICD-10-CM | POA: Diagnosis not present

## 2022-05-07 DIAGNOSIS — L821 Other seborrheic keratosis: Secondary | ICD-10-CM | POA: Diagnosis not present

## 2022-05-07 DIAGNOSIS — L570 Actinic keratosis: Secondary | ICD-10-CM | POA: Diagnosis not present

## 2022-05-07 LAB — GASTROINTESTINAL PANEL BY PCR, STOOL (REPLACES STOOL CULTURE)

## 2022-05-21 DIAGNOSIS — H0102A Squamous blepharitis right eye, upper and lower eyelids: Secondary | ICD-10-CM | POA: Diagnosis not present

## 2022-05-21 DIAGNOSIS — H25811 Combined forms of age-related cataract, right eye: Secondary | ICD-10-CM | POA: Diagnosis not present

## 2022-05-21 DIAGNOSIS — H0102B Squamous blepharitis left eye, upper and lower eyelids: Secondary | ICD-10-CM | POA: Diagnosis not present

## 2022-05-21 DIAGNOSIS — H04123 Dry eye syndrome of bilateral lacrimal glands: Secondary | ICD-10-CM | POA: Diagnosis not present

## 2022-05-21 DIAGNOSIS — H02834 Dermatochalasis of left upper eyelid: Secondary | ICD-10-CM | POA: Diagnosis not present

## 2022-05-21 DIAGNOSIS — H02831 Dermatochalasis of right upper eyelid: Secondary | ICD-10-CM | POA: Diagnosis not present

## 2022-05-21 DIAGNOSIS — H11823 Conjunctivochalasis, bilateral: Secondary | ICD-10-CM | POA: Diagnosis not present

## 2022-07-03 DIAGNOSIS — Z23 Encounter for immunization: Secondary | ICD-10-CM | POA: Diagnosis not present

## 2022-07-17 ENCOUNTER — Emergency Department (HOSPITAL_BASED_OUTPATIENT_CLINIC_OR_DEPARTMENT_OTHER): Payer: Medicare Other

## 2022-07-17 ENCOUNTER — Encounter (HOSPITAL_COMMUNITY): Payer: Self-pay

## 2022-07-17 ENCOUNTER — Encounter (HOSPITAL_BASED_OUTPATIENT_CLINIC_OR_DEPARTMENT_OTHER): Payer: Self-pay | Admitting: Emergency Medicine

## 2022-07-17 ENCOUNTER — Telehealth: Payer: Self-pay | Admitting: Cardiology

## 2022-07-17 ENCOUNTER — Emergency Department (HOSPITAL_BASED_OUTPATIENT_CLINIC_OR_DEPARTMENT_OTHER)
Admission: EM | Admit: 2022-07-17 | Discharge: 2022-07-17 | Discharge status: Against Medical Advice | Payer: Medicare Other | Attending: Emergency Medicine | Admitting: Emergency Medicine

## 2022-07-17 ENCOUNTER — Other Ambulatory Visit: Payer: Self-pay

## 2022-07-17 DIAGNOSIS — I110 Hypertensive heart disease with heart failure: Secondary | ICD-10-CM | POA: Insufficient documentation

## 2022-07-17 DIAGNOSIS — Z79899 Other long term (current) drug therapy: Secondary | ICD-10-CM | POA: Insufficient documentation

## 2022-07-17 DIAGNOSIS — R000 Tachycardia, unspecified: Secondary | ICD-10-CM | POA: Insufficient documentation

## 2022-07-17 DIAGNOSIS — I509 Heart failure, unspecified: Secondary | ICD-10-CM | POA: Insufficient documentation

## 2022-07-17 DIAGNOSIS — R7989 Other specified abnormal findings of blood chemistry: Secondary | ICD-10-CM | POA: Insufficient documentation

## 2022-07-17 DIAGNOSIS — I5023 Acute on chronic systolic (congestive) heart failure: Secondary | ICD-10-CM | POA: Diagnosis present

## 2022-07-17 DIAGNOSIS — I447 Left bundle-branch block, unspecified: Secondary | ICD-10-CM | POA: Insufficient documentation

## 2022-07-17 DIAGNOSIS — Z5329 Procedure and treatment not carried out because of patient's decision for other reasons: Secondary | ICD-10-CM

## 2022-07-17 DIAGNOSIS — R0602 Shortness of breath: Secondary | ICD-10-CM | POA: Diagnosis not present

## 2022-07-17 LAB — BRAIN NATRIURETIC PEPTIDE: B Natriuretic Peptide: 1541.4 pg/mL — ABNORMAL HIGH (ref 0.0–100.0)

## 2022-07-17 LAB — CBC WITH DIFFERENTIAL/PLATELET
Abs Immature Granulocytes: 0.01 10*3/uL (ref 0.00–0.07)
Basophils Absolute: 0.1 10*3/uL (ref 0.0–0.1)
Basophils Relative: 1 %
Eosinophils Absolute: 0.1 10*3/uL (ref 0.0–0.5)
Eosinophils Relative: 2 %
HCT: 41.2 % (ref 36.0–46.0)
Hemoglobin: 13.5 g/dL (ref 12.0–15.0)
Immature Granulocytes: 0 %
Lymphocytes Relative: 27 %
Lymphs Abs: 1.2 10*3/uL (ref 0.7–4.0)
MCH: 30 pg (ref 26.0–34.0)
MCHC: 32.8 g/dL (ref 30.0–36.0)
MCV: 91.6 fL (ref 80.0–100.0)
Monocytes Absolute: 0.3 10*3/uL (ref 0.1–1.0)
Monocytes Relative: 6 %
Neutro Abs: 2.8 10*3/uL (ref 1.7–7.7)
Neutrophils Relative %: 64 %
Platelets: 202 10*3/uL (ref 150–400)
RBC: 4.5 MIL/uL (ref 3.87–5.11)
RDW: 13.5 % (ref 11.5–15.5)
WBC: 4.5 10*3/uL (ref 4.0–10.5)
nRBC: 0 % (ref 0.0–0.2)

## 2022-07-17 LAB — COMPREHENSIVE METABOLIC PANEL
ALT: 29 U/L (ref 0–44)
AST: 42 U/L — ABNORMAL HIGH (ref 15–41)
Albumin: 3.8 g/dL (ref 3.5–5.0)
Alkaline Phosphatase: 79 U/L (ref 38–126)
Anion gap: 8 (ref 5–15)
BUN: 13 mg/dL (ref 8–23)
CO2: 28 mmol/L (ref 22–32)
Calcium: 9.2 mg/dL (ref 8.9–10.3)
Chloride: 103 mmol/L (ref 98–111)
Creatinine, Ser: 0.94 mg/dL (ref 0.44–1.00)
GFR, Estimated: 59 mL/min — ABNORMAL LOW (ref >60)
Glucose, Bld: 110 mg/dL — ABNORMAL HIGH (ref 70–99)
Potassium: 4.1 mmol/L (ref 3.5–5.1)
Sodium: 139 mmol/L (ref 135–145)
Total Bilirubin: 1.2 mg/dL (ref 0.3–1.2)
Total Protein: 7.2 g/dL (ref 6.5–8.1)

## 2022-07-17 LAB — TROPONIN I (HIGH SENSITIVITY)
Troponin I (High Sensitivity): 57 ng/L — ABNORMAL HIGH (ref <18)
Troponin I (High Sensitivity): 55 ng/L — ABNORMAL HIGH (ref <18)

## 2022-07-17 MED ORDER — FUROSEMIDE 10 MG/ML IJ SOLN
20.0000 mg | Freq: Once | INTRAMUSCULAR | Status: AC
  Administered 2022-07-17: 20 mg via INTRAVENOUS
  Filled 2022-07-17: qty 2

## 2022-07-17 MED ORDER — METOPROLOL SUCCINATE ER 25 MG PO TB24
12.5000 mg | ORAL_TABLET | Freq: Every day | ORAL | Status: DC
  Administered 2022-07-17: 12.5 mg via ORAL

## 2022-07-17 MED ORDER — SPIRONOLACTONE 12.5 MG HALF TABLET
12.5000 mg | ORAL_TABLET | Freq: Every day | ORAL | Status: DC
  Administered 2022-07-17: 12.5 mg via ORAL
  Filled 2022-07-17: qty 1

## 2022-07-17 MED ORDER — FUROSEMIDE 20 MG PO TABS: ORAL_TABLET | ORAL | 1 refills | Status: DC

## 2022-07-17 MED ORDER — ALBUTEROL SULFATE HFA 108 (90 BASE) MCG/ACT IN AERS: 2.0000 | INHALATION_SPRAY | RESPIRATORY_TRACT | Status: DC | PRN

## 2022-07-17 MED ORDER — IOHEXOL 350 MG/ML SOLN
75.0000 mL | Freq: Once | INTRAVENOUS | Status: AC | PRN
  Administered 2022-07-17: 75 mL via INTRAVENOUS

## 2022-07-17 MED ORDER — SACUBITRIL-VALSARTAN 24-26 MG PO TABS
1.0000 | ORAL_TABLET | Freq: Two times a day (BID) | ORAL | Status: DC
  Filled 2022-07-17: qty 1

## 2022-07-17 NOTE — Telephone Encounter (Signed)
Called the pt back.  Pt went to [facility] today for what looks like acute on chronic CHF and left AMA.      She was recommended for inpatient admission and was initially agreeable, and then she was told that it would be approximately 4-hour wait before bed assignment and she was becoming frustrated and stated that she would like to leave and just see her cardiologist.  Patient was then explained the risk of heart failure and the recommendations at that time for admission however she chose to leave AMA.   She then called our office to make an appt for this which was scheduled for 08/08/22 with Christen Bame NP.   Labs were done on the pt and CT ANGIO to rule out PE was done and showed no PE but she did have pleural effusions and evidence for pulmonary edema.   Was able to make the pt a sooner appt with our office for next Monday 1/22 at 1030 with Ambrose Pancoast NP.  She was advised to arrive 15 mins prior to this appt.   This was soonest available appt with our office.  Advised the pt to take her medications as prescribed, especially her diuretic.  Will send in a refill of her lasix for 30 day supply, being the dose will more than likely change at her upcoming OV on 1/22.  Advised her to wear compressions and reduce salt in her diet.  Elevate her extremities at rest.   ED precautions provided to the pt if symptoms were to worsen or persist between now and her appt with Jaquelyn Bitter on Monday.  Pt verbalized understanding and agrees with this plan.  Will send this message to Dr. Johney Frame to make her aware of this plan.

## 2022-07-17 NOTE — ED Notes (Signed)
ED Provider at bedside. 

## 2022-07-17 NOTE — ED Notes (Signed)
Pt and spouse refuse to be transported upon arrival of [facility], state that there was no update regarding why transport did not arrives at 1030. This RN informed pt and spouse that we do not provide a time when transport will arrive d/t situations when transport needs to be redirected. Pt and spouse were adamant that pt no longer wish to be transported because there have been no updates. EDP Maylon Peppers was notified and went to bedside to speak with patient and husband. Comfort care has been provided for pt and family members during stay at this facility. Pt states that she wishes to contact her personal cardiologist. This RN attempted to persuade pt to continue with admission but pt refused. Pt was made aware that she was leaving AMA and informed that her condition could worsen to the extreme of death. Pt and family member was also informed that if pt becomes shob, that EMS should be notified

## 2022-07-17 NOTE — ED Notes (Signed)
Patient and husband informed that a bed has been assigned to her. Informed that bed is dirty however will be going to [facility]. Provided husband with a map of [facility] hospital and directions on how to get to [facility]. Will keep them updated on bed status.

## 2022-07-17 NOTE — ED Provider Notes (Signed)
[facility] EMERGENCY DEPARTMENT Provider Note   CSN: [id number] Arrival date & time: 07/17/22  0258     History  Chief Complaint  Patient presents with   Shortness of Breath    Sabrina Hernandez is a 88 y.o. female.  Patient is a 88 year old female with past medical history of congestive heart failure, hypertension, GERD.  Patient presenting today for evaluation of shortness of breath.  She states that this has been occurring episodically for many months, but became worse this evening.  She describes having trouble breathing when she lies flat and exerts herself.  She denies to me she is having any chest pain, fevers, chills, or cough.  She denies leg swelling.  The history is provided by the patient.       Home Medications Prior to Admission medications   Medication Sig Start Date End Date Taking? Authorizing Provider  amoxicillin-clavulanate (AUGMENTIN) 875-125 MG tablet Take 1 tablet by mouth every 12 (twelve) hours. 05/06/22   Elgie Congo, MD  furosemide (LASIX) 20 MG tablet TAKE 1 TABLET(20 MG) BY MOUTH DAILY 01/08/22   [name], MD  loperamide (IMODIUM) 2 MG capsule Take 1 capsule (2 mg total) by mouth every 12 (twelve) hours as needed for diarrhea or loose stools (If having >4 stools, take pill/). 05/06/22   Elgie Congo, MD  metoprolol succinate (TOPROL XL) 25 MG 24 hr tablet Take 0.5 tablets (12.5 mg total) by mouth daily. 10/30/21   [name] T, PA-C  Multiple Vitamin (MULTIVITAMIN WITH MINERALS) TABS tablet Take 1 tablet by mouth every morning.    [provider]  ondansetron (ZOFRAN-ODT) 4 MG disintegrating tablet Take 1 tablet (4 mg total) by mouth every 8 (eight) hours as needed for nausea or vomiting. 05/06/22   Elgie Congo, MD  sacubitril-valsartan (ENTRESTO) 24-26 MG Take 1 tablet by mouth 2 (two) times daily. 10/30/21   [name] T, PA-C  spironolactone (ALDACTONE) 25 MG tablet Take 0.5 tablets (12.5 mg total)  by mouth daily. 10/04/21   [name], MD      Allergies    Patient has no known allergies.    Review of Systems   Review of Systems  All other systems reviewed and are negative.   Physical Exam Updated Vital Signs BP (!) 163/97 (BP Location: Left Arm)   Pulse (!) 108   Temp (!) 97.4 F (36.3 C) (Oral)   Resp (!) 22   Ht 5' (1.524 m)   Wt 47.2 kg   SpO2 97%   BMI 20.31 kg/m  Physical Exam Vitals and nursing note reviewed.  Constitutional:      General: She is not in acute distress.    Appearance: She is well-developed. She is not diaphoretic.  HENT:     Head: Normocephalic and atraumatic.  Cardiovascular:     Rate and Rhythm: Normal rate and regular rhythm.     Heart sounds: No murmur heard.    No friction rub. No gallop.  Pulmonary:     Effort: Pulmonary effort is normal. No respiratory distress.     Breath sounds: Normal breath sounds. No wheezing.  Abdominal:     General: Bowel sounds are normal. There is no distension.     Palpations: Abdomen is soft.     Tenderness: There is no abdominal tenderness.  Musculoskeletal:        General: Normal range of motion.     Cervical back: Normal range of motion and neck supple.  Right lower leg: No tenderness. No edema.     Left lower leg: No tenderness. No edema.  Skin:    General: Skin is warm and dry.  Neurological:     General: No focal deficit present.     Mental Status: She is alert and oriented to person, place, and time.     ED Results / Procedures / Treatments   Labs (all labs ordered are listed, but only abnormal results are displayed) Labs Reviewed  COMPREHENSIVE METABOLIC PANEL  CBC WITH DIFFERENTIAL/PLATELET  BRAIN NATRIURETIC PEPTIDE  TROPONIN I (HIGH SENSITIVITY)    EKG EKG Interpretation  Date/Time:  Thursday July 17 2022 03:14:30 EST Ventricular Rate:  108 PR Interval:  158 QRS Duration: 148 QT Interval:  381 QTC Calculation: 511 R Axis:   161 Text Interpretation: Sinus  tachycardia Left bundle branch block Probable left atrial enlargement Left ventricular hypertrophy No significant change since 07/30/2022 Confirmed by Veryl Speak [phone]) on 07/17/2022 3:17:02 AM  Radiology No results found.  Procedures Procedures    Medications Ordered in ED Medications  albuterol (VENTOLIN HFA) 108 (90 Base) MCG/ACT inhaler 2 puff (has no administration in time range)    ED Course/ Medical Decision Making/ A&P  Patient is an 88 year old female presenting with complaints of shortness of breath.  She has history of CHF and has been off of her Lasix for the past 2 months.  She tells me she ran out of this, the then never got the prescription refilled.  She denies chest pain, cough, or fever.  Patient arrives here with stable vital signs and no hypoxia.  She is tachycardic and has been persistently tachycardic throughout her ED stay.  Physical exam reveals some rales in the bases, but is otherwise basically unremarkable.  Workup initiated including CBC, metabolic panel, troponin, and BNP.  Pertinent abnormalities are a BNP of 1500 and a mildly elevated troponin, but she does seem to run a chronically elevated troponin.  Remainder of laboratory studies basically unremarkable.  Chest x-ray shows signs of CHF.  I also ordered a CTA of the chest to rule out pulmonary embolism.  This showed no evidence for PE, but did show bilateral pleural effusions and evidence for pulmonary edema.  Patient has received 20 mg of Lasix here in the ER with some diuresis.  Patient's vital signs here reveal a tachycardia, but no hypoxia.  She does become tachypneic and winded with ambulation to the bathroom.  Due to the above symptoms and most recent echo showing significantly depressed cardiac function, I feel as though patient should be admitted for control diuresis and further observation.  I spoke with Dr. Alcario Drought who agrees to admit.  Final Clinical Impression(s) / ED Diagnoses Final  diagnoses:  None    Rx / DC Orders ED Discharge Orders     None         Veryl Speak, MD 07/17/22 [phone]

## 2022-07-17 NOTE — ED Notes (Signed)
Patients husband in hallway, this RN asked if he needed anything, asking for an update about bed status. Husband informed that her bed has been cleaned and [facility] was called, we are just waiting for them to arrive. Informed him that we would update him when they call that they are on their way.

## 2022-07-17 NOTE — ED Notes (Addendum)
Upon entering the room, pt is completely dressed to go home, IV self-removed, all leads and oxygen cannula removed as well. Pt is pleasantly confused but didn't understand that she was being admitted. So explained [facility] to [facility], Charge RN is explaining this information again.

## 2022-07-17 NOTE — Telephone Encounter (Addendum)
Patient's daughter called stating she is concerned because she got a text saying from her father was taking her mother in for an appt.  Then her sister got a call later on in the day stating her mother wasn't able to have her appt and the testing wasn't able to be done. He got upset and was yelling and people were laughing at him.  Daughter states father sounded confused, she is just trying to make sense of what is going on.  It looks like patient was at the emergency room at the [facility] today, from the notes it looks like patient left AMA.  She would like for someone to give her mother a call to check on her.

## 2022-07-17 NOTE — ED Provider Notes (Signed)
Patient was signed out to me at 0700 pending inpatient bed.  In short this is an 88 year old female with a past medical history of CHF and hypertension that presented with shortness of breath.  She was found to be volume overloaded and CHF exacerbation and had significant dyspnea on exertion.  She was recommended for inpatient admission and was initially agreeable.  Patient had approximately 4-hour wait before bed assignment and she was becoming frustrated and states that she would like to leave and just see her cardiologist.  Patient was explained the risk of heart failure and the recommendations for admission however she chose to leave [place].  The patient had a normal mental status examination and understands his/ her condition and the risks of leaving including worsening heart failure, respiratory failure, and death. The patient has had an opportunity to ask  questions about her medical condition. The patient has been informed  that he/she may return for care at any time and has been referred to his/her  physician. The patient's family member was present for the conversation.    Leanord Asal K, DO 07/17/22 1342

## 2022-07-17 NOTE — Discharge Instructions (Signed)
You were seen in the emergency department for your shortness of breath.  You do have extra fluid on your lungs and we recommended that you stay in the hospital for continued Lasix through the IV.  You chose to leave the hospital [place] you are at risk for worsening heart failure that could lead to death.  You should follow-up with your primary doctor within the next 24 hours to have your symptoms rechecked.  You can return to the emergency department should you change your mind or if you have any other new or concerning symptoms.

## 2022-07-17 NOTE — ED Triage Notes (Signed)
Pt States SOB off and on X 1-2 weeks but Is much worse tonight.

## 2022-07-20 NOTE — Progress Notes (Signed)
Office Visit    Patient Name: Sabrina Hernandez Date of Encounter: 07/20/2022  Primary Care Provider:  [name], MD Primary Cardiologist:  [name], MD Primary Electrophysiologist: None  Chief Complaint    Sabrina Hernandez is a 88 y.o. female with PMH of HFrEF, GERD, HTN, LBBB who presents today for acute on chronic CHF following recent ED visit. Past Medical History    Past Medical History:  Diagnosis Date   Diverticulitis    HFrEF (heart failure with reduced ejection fraction) ([place]) 07/30/2021   Presumed nonischemic cardiomyopathy  Myoview 4/17 ([facility]): EF 28, no ischemia Intolerant of metoprolol/ACE inhibitor due to fatigue Echo 2017: EF 20-25 Echocardiogram 10/01/2020:  EF <20, normal RVSF, trivial pericardial effusion Echocardiogram 08/30/2020: EF <20, global HK, GR 1 DD, normal RVSF, mild MR, AV sclerosis without stenosis, small-moderate pericardial effusion    Past Surgical History:  Procedure Laterality Date   CERVICAL CONE BIOPSY     HAMMER TOE SURGERY     right foot    ORIF PATELLA Left 01/29/2015   Procedure: OPEN REDUCTION INTERNAL (ORIF) FIXATION LEFT PATELLA;  Surgeon: Paralee Cancel, MD;  Location: [facility];  Service: Orthopedics;  Laterality: Left;    Allergies  No Known Allergies  History of Present Illness    Sabrina Hernandez  is a 88 year old female with the above mention past medical history who presents today for acute on chronic CHF following recent [facility] ED visit. She was initially diagnosed with HFrEF in 2017 when she presented with worsening SOB. Was seen at [facility]. Declined cath at that time but myoview was negative for ischemia. .  Sabrina Hernandez was initially seen by Dr. Johney Frame 08/09/2020 following referral by PCP for management of HFrEF.  She had complained of DOE over previous few months and was seen in the ED 08/02/2020 and found to have exacerbation of CHF with BNP of 1145 and troponins trending 49-53.  She was diuresed with IV Lasix  with good response and discharged with plans to follow-up with cardiology.  2D echo was completed and showed EF of less than 20% with decreased LV function and global hypokinesis with grade 1 DD and small pericardial effusion.  Repeat echo completed 09/2020 to evaluate progression of pericardial effusion that showed improvement with same EF.  Started on Lasix 20 mg twice daily and patient was started on SGLT2 with further optimization of GDMT with spironolactone and Entresto.  She was seen in follow-up 07/2021 by [name], PA and patient noted that she had discontinued her Entresto, metoprolol, spironolactone.  She was noted to be up 3 pounds on her weight.  She was noted to have elevated heart rate and was encouraged to restart GDMT and was willing to start bisoprolol 2.5 mg.  She was seen by Dr. Johney Frame 09/20/2021 and patient was reportedly doing well but reported difficulty controlling her bowels.  She is stopped her medication because she ran out.  She declined any invasive workup at that time and patient was started on Lasix 20 mg twice daily and trial of Entresto with Pharm.D. consultation placed.  She was seen by Pharm.D./7/23 and was tolerating Entresto and was placed on spironolactone.  She was seen by [name], PA on 10/30/2021 and reported doing well overall.  She noted some brief episodes of sharp chest pain on the right but no significant shortness of breath.  She declined any invasive testing for chest discomfort and left for candidate for 4 months.  She was seen in the ED on 07/17/2022 with complaint of shortness of breath.  Her BNP was noted to be 1500 and troponins were mildly elevated.  Chest x-ray was shows signs of pulmonary congestion and.  She underwent a cardiac CTA to rule out PE that showed bilateral pleural effusions and no evidence of PE.  She received 20 mg of Lasix with diuresis.  She was noted to be tachycardia and tachypneic with ambulation.  She was recommended for inpatient  workup however patient left AMA prior to being transported to [facility] for admission.   Sabrina Hernandez presents today alone for post ED follow-up.  Since last being seen in the office patient reports that she is still having complaints of shortness of breath but has improved since having an IV Lasix in the ED.  She is still having to use 2 pillows to prop up at night.  She is euvolemic on examination today.  She reports that she ran out of all her medications for the past 4 to 8 months.  She reports that she was aware of being out of medications but felt like she did not need to use them at this time.  She reports that she is the youngest of 28 children and her oldest sibling had a heart transplant operation.  She also reports that her youngest brother who is age 10 recently died.  CT scan of the chest was completed to rule out pulmonary embolism that showed pulmonary consolidation and aortic atherosclerosis.  We discussed in detail the importance of medication compliance and the need to complete further testing to determine her cause of CHF.  She politely declines further testing at this time but is interested in resuming her medication regimen.  Her blood pressure today is well-controlled at 108/80 with heart rate of 102 bpm..  Patient denies chest pain, palpitations, dyspnea, PND, orthopnea, nausea, vomiting, dizziness, syncope, edema, weight gain, or early satiety.   Home Medications    Current Outpatient Medications  Medication Sig Dispense Refill   amoxicillin-clavulanate (AUGMENTIN) 875-125 MG tablet Take 1 tablet by mouth every 12 (twelve) hours. 14 tablet 0   furosemide (LASIX) 20 MG tablet TAKE 1 TABLET(20 MG) BY MOUTH DAILY 30 tablet 1   loperamide (IMODIUM) 2 MG capsule Take 1 capsule (2 mg total) by mouth every 12 (twelve) hours as needed for diarrhea or loose stools (If having >4 stools, take pill/). 12 capsule 0   metoprolol succinate (TOPROL XL) 25 MG 24 hr tablet Take 0.5 tablets (12.5 mg  total) by mouth daily. 90 tablet 3   Multiple Vitamin (MULTIVITAMIN WITH MINERALS) TABS tablet Take 1 tablet by mouth every morning.     ondansetron (ZOFRAN-ODT) 4 MG disintegrating tablet Take 1 tablet (4 mg total) by mouth every 8 (eight) hours as needed for nausea or vomiting. 20 tablet 0   sacubitril-valsartan (ENTRESTO) 24-26 MG Take 1 tablet by mouth 2 (two) times daily. 180 tablet 3   spironolactone (ALDACTONE) 25 MG tablet Take 0.5 tablets (12.5 mg total) by mouth daily. 45 tablet 3   No current facility-administered medications for this visit.     Review of Systems  Please see the history of present illness.    (+) Shortness of breath with exertion  All other systems reviewed and are otherwise negative except as noted above.  Physical Exam    Wt Readings from Last 3 Encounters:  07/17/22 104 lb (47.2 kg)  05/06/22 103 lb (46.7 kg)  10/30/21 108 lb  6.4 oz (49.2 kg)   VH:QIONG were no vitals filed for this visit.,There is no height or weight on file to calculate BMI.  Constitutional:      Appearance: Healthy appearance. Not in distress.  Neck:     Vascular: JVD normal.  Pulmonary:     Effort: Pulmonary effort is normal.     Breath sounds: No wheezing. No rales. Diminished in the bases Cardiovascular:     Normal rate. Regular rhythm. Normal S1. Normal S2.      Murmurs: There is no murmur.  Edema:    Peripheral edema absent.  Abdominal:     Palpations: Abdomen is soft non tender. There is no hepatomegaly.  Skin:    General: Skin is warm and dry.  Neurological:     General: No focal deficit present.     Mental Status: Alert and oriented to person, place and time.     Cranial Nerves: Cranial nerves are intact.  EKG/LABS/Other Studies Reviewed    ECG personally reviewed by me today -sinus tach with rate of 102 bpm and possible left atrial enlargement with left axis deviation and LBBB with no acute changes noted since previous EKG.  Lab Results  Component Value Date    CREATININE 0.94 07/17/2022   BUN 13 07/17/2022   NA 139 07/17/2022   K 4.1 07/17/2022   CL 103 07/17/2022   CO2 28 07/17/2022   Lab Results  Component Value Date   ALT 29 07/17/2022   AST 42 (H) 07/17/2022   ALKPHOS 79 07/17/2022   BILITOT 1.2 07/17/2022   No results found for: "CHOL", "HDL", "LDLCALC", "LDLDIRECT", "TRIG", "CHOLHDL"  No results found for: "HGBA1C"  Assessment & Plan    1.  HFrEF: -Patient recently admitted 07/17/2022 with shortness of breath and found to have elevated BNP with CT of the chest completed showing bilateral pleural effusions.  Patient however left AMA prior to recommended admission. -Today patient reports she has shortness of breath with lying flat however appears to be euvolemic on examination. -She is currently not taking any of her GDMT and reports that she has been off of her medications since May 2023. -We will resume Entresto 24/26 mg twice daily and she will also start Lasix 20 mg twice daily x 3 days and then 20 mg daily.  She will start potassium 10 mEq daily and then as needed. -We discussed the need for ischemic workup to rule out causes for CHF however patient politely declines at this time. -Low sodium diet, fluid restriction <2L, and daily weights encouraged. Educated to contact our office for weight gain of 2 lbs overnight or 5 lbs in one week.   2.  Essential hypertension: -Patient's blood pressure today was well-controlled at 128/80 -We will plan to add metoprolol 25 mg at follow-up in 1 month.  3.  Shortness of breath: -Patient continues to have shortness of breath with heavy exertion and lying flat. -Patient had small pleural effusions noted bilaterally on cardiac CT. -We will obtain 2D echo to evaluate pleural effusions further in 1 week. -We will resume GDMT with Entresto with plans to resume Aldactone and metoprolol at follow-up in 1 months.   4.  Aortic atherosclerosis: -Patient underwent a recent CT of the chest to rule  out PE that revealed atherosclerosis. -Patient declined further coronary workup at this time.  Disposition: Follow-up with [name], MD or APP in 1 month    Medication Adjustments/Labs and Tests Ordered: Current medicines are reviewed at  length with the patient today.  Concerns regarding medicines are outlined above.   Signed, Mable Fill, Marissa Nestle, NP 07/20/2022, 4:09 PM [facility]  Note:  This document was prepared using Dragon voice recognition software and may include unintentional dictation errors.

## 2022-07-21 ENCOUNTER — Encounter: Payer: Self-pay | Admitting: Nurse Practitioner

## 2022-07-21 ENCOUNTER — Ambulatory Visit: Payer: Medicare Other | Attending: Nurse Practitioner | Admitting: Nurse Practitioner

## 2022-07-21 ENCOUNTER — Other Ambulatory Visit: Payer: Self-pay | Admitting: Nurse Practitioner

## 2022-07-21 VITALS — BP 108/80 | HR 102 | Ht 60.0 in | Wt 108.8 lb

## 2022-07-21 DIAGNOSIS — R0602 Shortness of breath: Secondary | ICD-10-CM | POA: Insufficient documentation

## 2022-07-21 DIAGNOSIS — I10 Essential (primary) hypertension: Secondary | ICD-10-CM | POA: Insufficient documentation

## 2022-07-21 DIAGNOSIS — I5023 Acute on chronic systolic (congestive) heart failure: Secondary | ICD-10-CM | POA: Insufficient documentation

## 2022-07-21 DIAGNOSIS — I3139 Other pericardial effusion (noninflammatory): Secondary | ICD-10-CM | POA: Diagnosis not present

## 2022-07-21 MED ORDER — POTASSIUM CHLORIDE CRYS ER 10 MEQ PO TBCR: 10.0000 meq | EXTENDED_RELEASE_TABLET | Freq: Every day | ORAL | 1 refills | Status: DC | PRN

## 2022-07-21 MED ORDER — FUROSEMIDE 20 MG PO TABS: ORAL_TABLET | ORAL | 1 refills | Status: DC

## 2022-07-21 MED ORDER — ENTRESTO 24-26 MG PO TABS: 1.0000 | ORAL_TABLET | Freq: Two times a day (BID) | ORAL | 1 refills | Status: DC

## 2022-07-21 NOTE — Patient Instructions (Addendum)
Medication Instructions:  INCREASE Lasix take '20mg'$  take 1 tablet twice a day for 3 days then take once a day RESTART Entresto 24/'26mg'$  Take 1 tablet twice a day  TAKE Potassium 10MEQ take 1 tablet once a day for 3 days then take as needed *If you need a refill on your cardiac medications before your next appointment, please call your pharmacy* CHECK YOUR WEIGHT DAILY AND CONTACT THE OFFICE IF YOU GAIN MORE THAN 2 LBS IN A DAY OR 5 LBS IN A WEEK   Lab Work: 2 weeks BMET If you have labs (blood work) drawn today and your tests are completely normal, you will receive your results only by: [employer] (if you have MyChart) OR A paper copy in the mail If you have any lab test that is abnormal or we need to change your treatment, we will call you to review the results.   Testing/Procedures: None Ordered   Follow-Up: At [facility], you and your health needs are our priority.  As part of our continuing mission to provide you with exceptional heart care, we have created designated Provider Care Teams.  These Care Teams include your primary Cardiologist (physician) and Advanced Practice Providers (APPs -  Physician Assistants and Nurse Practitioners) who all work together to provide you with the care you need, when you need it.  We recommend signing up for the patient portal called "MyChart".  Sign up information is provided on this After Visit Summary.  MyChart is used to connect with patients for Virtual Visits (Telemedicine).  Patients are able to view lab/test results, encounter notes, upcoming appointments, etc.  Non-urgent messages can be sent to your provider as well.   To learn more about what you can do with MyChart, go to [email or web address].    Your next appointment:   1 month(s)  Provider:   Ambrose Pancoast, NP     Other Instructions

## 2022-08-04 ENCOUNTER — Other Ambulatory Visit: Payer: Medicare Other

## 2022-08-08 ENCOUNTER — Ambulatory Visit: Payer: Medicare Other | Admitting: Nurse Practitioner

## 2022-08-11 ENCOUNTER — Telehealth: Payer: Self-pay | Admitting: Family Medicine

## 2022-08-11 ENCOUNTER — Telehealth: Payer: Self-pay | Admitting: Cardiology

## 2022-08-11 NOTE — Telephone Encounter (Signed)
Patient has refills at pharmacy called pharmacy they will get this ready. Called patient to inform no answer unable to LM

## 2022-08-11 NOTE — Telephone Encounter (Signed)
Caller Name: Dailah Call back phone #: [phone]  Reason for Call: pt needs her lasix refilled she has non left.

## 2022-08-11 NOTE — Telephone Encounter (Signed)
Patient stated she is currently experiencing shortness of breath upon excursion and slight chest tightness ( its not uncomfortable). During the last office visit patient was prescribed 3 medications Medication Instructions:  INCREASE Lasix take 54m take 1 tablet twice a day for 3 days then take once a day RESTART Entresto 24/25mTake 1 tablet twice a day  TAKE Potassium 10MEQ take 1 tablet once a day for 3 days then take as needed *If you need a refill on your cardiac medications before your next appointment, please call your pharmacy* CHECK YOUR WEIGHT DAILY AND CONTACT THE OFFICE IF YOU GAIN MORE THAN 2 LBS IN A DAY OR 5 LBS IN A WEEK    Patient stated she had experienced severe diarrhea  and has stopped taking all of the medications listed above, has not contact the clinic pertaining to the symptom. Advised patient to call 911 or seek medical attention. She will like to know if there any recommendations the APP can suggest. Will forward to APP .

## 2022-08-11 NOTE — Telephone Encounter (Signed)
Patient called in discuss things with the nurse. Please advise

## 2022-08-11 NOTE — Telephone Encounter (Signed)
Called the patient and explained the what APP  advised : Please advise patient to go to the ED for evaluation due to the acute nature of her shortness of breath and chest discomfort.  Patient voiced understanding.

## 2022-08-12 ENCOUNTER — Other Ambulatory Visit: Payer: Self-pay

## 2022-08-12 ENCOUNTER — Emergency Department (HOSPITAL_BASED_OUTPATIENT_CLINIC_OR_DEPARTMENT_OTHER): Payer: Medicare Other

## 2022-08-12 ENCOUNTER — Inpatient Hospital Stay (HOSPITAL_BASED_OUTPATIENT_CLINIC_OR_DEPARTMENT_OTHER)
Admission: EM | Admit: 2022-08-12 | Discharge: 2022-08-14 | DRG: 291 | Disposition: A | Discharge status: Home Health | Payer: Medicare Other | Attending: Internal Medicine | Admitting: Internal Medicine

## 2022-08-12 ENCOUNTER — Telehealth: Payer: Self-pay | Admitting: Pharmacist

## 2022-08-12 ENCOUNTER — Encounter (HOSPITAL_BASED_OUTPATIENT_CLINIC_OR_DEPARTMENT_OTHER): Payer: Self-pay

## 2022-08-12 DIAGNOSIS — Z8249 Family history of ischemic heart disease and other diseases of the circulatory system: Secondary | ICD-10-CM

## 2022-08-12 DIAGNOSIS — I5023 Acute on chronic systolic (congestive) heart failure: Secondary | ICD-10-CM | POA: Diagnosis present

## 2022-08-12 DIAGNOSIS — E785 Hyperlipidemia, unspecified: Secondary | ICD-10-CM | POA: Diagnosis present

## 2022-08-12 DIAGNOSIS — F1010 Alcohol abuse, uncomplicated: Secondary | ICD-10-CM | POA: Diagnosis present

## 2022-08-12 DIAGNOSIS — J45909 Unspecified asthma, uncomplicated: Secondary | ICD-10-CM | POA: Diagnosis present

## 2022-08-12 DIAGNOSIS — I447 Left bundle-branch block, unspecified: Secondary | ICD-10-CM | POA: Diagnosis present

## 2022-08-12 DIAGNOSIS — R072 Precordial pain: Secondary | ICD-10-CM | POA: Diagnosis not present

## 2022-08-12 DIAGNOSIS — J90 Pleural effusion, not elsewhere classified: Secondary | ICD-10-CM

## 2022-08-12 DIAGNOSIS — R0902 Hypoxemia: Secondary | ICD-10-CM

## 2022-08-12 DIAGNOSIS — Z79899 Other long term (current) drug therapy: Secondary | ICD-10-CM

## 2022-08-12 DIAGNOSIS — R413 Other amnesia: Secondary | ICD-10-CM | POA: Diagnosis present

## 2022-08-12 DIAGNOSIS — I509 Heart failure, unspecified: Secondary | ICD-10-CM | POA: Diagnosis not present

## 2022-08-12 DIAGNOSIS — J9811 Atelectasis: Secondary | ICD-10-CM | POA: Diagnosis not present

## 2022-08-12 DIAGNOSIS — J189 Pneumonia, unspecified organism: Secondary | ICD-10-CM | POA: Diagnosis not present

## 2022-08-12 DIAGNOSIS — J984 Other disorders of lung: Secondary | ICD-10-CM

## 2022-08-12 DIAGNOSIS — Z823 Family history of stroke: Secondary | ICD-10-CM

## 2022-08-12 DIAGNOSIS — Z87891 Personal history of nicotine dependence: Secondary | ICD-10-CM

## 2022-08-12 DIAGNOSIS — I8391 Asymptomatic varicose veins of right lower extremity: Secondary | ICD-10-CM | POA: Diagnosis present

## 2022-08-12 DIAGNOSIS — Z811 Family history of alcohol abuse and dependence: Secondary | ICD-10-CM

## 2022-08-12 DIAGNOSIS — Z91148 Patient's other noncompliance with medication regimen for other reason: Secondary | ICD-10-CM

## 2022-08-12 DIAGNOSIS — Z1152 Encounter for screening for COVID-19: Secondary | ICD-10-CM

## 2022-08-12 DIAGNOSIS — I700 Atherosclerosis of aorta: Secondary | ICD-10-CM | POA: Diagnosis present

## 2022-08-12 DIAGNOSIS — Z818 Family history of other mental and behavioral disorders: Secondary | ICD-10-CM

## 2022-08-12 DIAGNOSIS — I110 Hypertensive heart disease with heart failure: Principal | ICD-10-CM | POA: Diagnosis present

## 2022-08-12 DIAGNOSIS — Z91128 Patient's intentional underdosing of medication regimen for other reason: Secondary | ICD-10-CM

## 2022-08-12 DIAGNOSIS — I10 Essential (primary) hypertension: Secondary | ICD-10-CM | POA: Diagnosis present

## 2022-08-12 DIAGNOSIS — K219 Gastro-esophageal reflux disease without esophagitis: Secondary | ICD-10-CM | POA: Diagnosis present

## 2022-08-12 DIAGNOSIS — T447X6A Underdosing of beta-adrenoreceptor antagonists, initial encounter: Secondary | ICD-10-CM | POA: Diagnosis present

## 2022-08-12 DIAGNOSIS — R0789 Other chest pain: Secondary | ICD-10-CM | POA: Diagnosis not present

## 2022-08-12 DIAGNOSIS — J811 Chronic pulmonary edema: Secondary | ICD-10-CM | POA: Diagnosis not present

## 2022-08-12 DIAGNOSIS — I428 Other cardiomyopathies: Secondary | ICD-10-CM | POA: Diagnosis present

## 2022-08-12 DIAGNOSIS — I5020 Unspecified systolic (congestive) heart failure: Secondary | ICD-10-CM | POA: Diagnosis present

## 2022-08-12 DIAGNOSIS — Z7982 Long term (current) use of aspirin: Secondary | ICD-10-CM

## 2022-08-12 DIAGNOSIS — R0602 Shortness of breath: Secondary | ICD-10-CM | POA: Diagnosis not present

## 2022-08-12 LAB — BASIC METABOLIC PANEL
Anion gap: 7 (ref 5–15)
BUN: 15 mg/dL (ref 8–23)
CO2: 24 mmol/L (ref 22–32)
Calcium: 8.4 mg/dL — ABNORMAL LOW (ref 8.9–10.3)
Chloride: 104 mmol/L (ref 98–111)
Creatinine, Ser: 1.06 mg/dL — ABNORMAL HIGH (ref 0.44–1.00)
GFR, Estimated: 51 mL/min — ABNORMAL LOW (ref >60)
Glucose, Bld: 121 mg/dL — ABNORMAL HIGH (ref 70–99)
Potassium: 3.5 mmol/L (ref 3.5–5.1)
Sodium: 135 mmol/L (ref 135–145)

## 2022-08-12 LAB — CBC
HCT: 39.7 % (ref 36.0–46.0)
HCT: 38.5 % (ref 36.0–46.0)
Hemoglobin: 13 g/dL (ref 12.0–15.0)
Hemoglobin: 12.7 g/dL (ref 12.0–15.0)
MCH: 29.7 pg (ref 26.0–34.0)
MCH: 29.6 pg (ref 26.0–34.0)
MCHC: 32.7 g/dL (ref 30.0–36.0)
MCHC: 33 g/dL (ref 30.0–36.0)
MCV: 90.6 fL (ref 80.0–100.0)
MCV: 89.7 fL (ref 80.0–100.0)
Platelets: 201 10*3/uL (ref 150–400)
Platelets: 192 10*3/uL (ref 150–400)
RBC: 4.38 MIL/uL (ref 3.87–5.11)
RBC: 4.29 MIL/uL (ref 3.87–5.11)
RDW: 13.6 % (ref 11.5–15.5)
RDW: 13.9 % (ref 11.5–15.5)
WBC: 5.4 10*3/uL (ref 4.0–10.5)
WBC: 5.1 10*3/uL (ref 4.0–10.5)
nRBC: 0 % (ref 0.0–0.2)
nRBC: 0 % (ref 0.0–0.2)

## 2022-08-12 LAB — DIFFERENTIAL
Abs Immature Granulocytes: 0.02 10*3/uL (ref 0.00–0.07)
Basophils Absolute: 0.1 10*3/uL (ref 0.0–0.1)
Basophils Relative: 1 %
Eosinophils Absolute: 0.1 10*3/uL (ref 0.0–0.5)
Eosinophils Relative: 1 %
Immature Granulocytes: 0 %
Lymphocytes Relative: 18 %
Lymphs Abs: 0.9 10*3/uL (ref 0.7–4.0)
Monocytes Absolute: 0.4 10*3/uL (ref 0.1–1.0)
Monocytes Relative: 8 %
Neutro Abs: 3.6 10*3/uL (ref 1.7–7.7)
Neutrophils Relative %: 72 %

## 2022-08-12 LAB — RESP PANEL BY RT-PCR (RSV, FLU A&B, COVID)  RVPGX2
Influenza A by PCR: NEGATIVE
Influenza B by PCR: NEGATIVE
Resp Syncytial Virus by PCR: NEGATIVE
SARS Coronavirus 2 by RT PCR: NEGATIVE

## 2022-08-12 LAB — TROPONIN I (HIGH SENSITIVITY)
Troponin I (High Sensitivity): 85 ng/L — ABNORMAL HIGH (ref <18)
Troponin I (High Sensitivity): 78 ng/L — ABNORMAL HIGH (ref <18)

## 2022-08-12 LAB — PROTIME-INR
INR: 1.1 (ref 0.8–1.2)
Prothrombin Time: 13.9 seconds (ref 11.4–15.2)

## 2022-08-12 LAB — HEPATIC FUNCTION PANEL
ALT: 43 U/L (ref 0–44)
AST: 55 U/L — ABNORMAL HIGH (ref 15–41)
Albumin: 3.5 g/dL (ref 3.5–5.0)
Alkaline Phosphatase: 91 U/L (ref 38–126)
Bilirubin, Direct: 0.2 mg/dL (ref 0.0–0.2)
Indirect Bilirubin: 1.2 mg/dL — ABNORMAL HIGH (ref 0.3–0.9)
Total Bilirubin: 1.4 mg/dL — ABNORMAL HIGH (ref 0.3–1.2)
Total Protein: 6.6 g/dL (ref 6.5–8.1)

## 2022-08-12 LAB — BRAIN NATRIURETIC PEPTIDE: B Natriuretic Peptide: 2514 pg/mL — ABNORMAL HIGH (ref 0.0–100.0)

## 2022-08-12 LAB — APTT: aPTT: 30 seconds (ref 24–36)

## 2022-08-12 LAB — HEMOGLOBIN A1C
Hgb A1c MFr Bld: 5 % (ref 4.8–5.6)
Mean Plasma Glucose: 96.8 mg/dL

## 2022-08-12 MED ORDER — HEPARIN SODIUM (PORCINE) 5000 UNIT/ML IJ SOLN: 60.0000 [IU]/kg | Freq: Once | INTRAMUSCULAR | Status: DC

## 2022-08-12 MED ORDER — IOHEXOL 350 MG/ML SOLN
75.0000 mL | Freq: Once | INTRAVENOUS | Status: AC | PRN
  Administered 2022-08-12: 75 mL via INTRAVENOUS

## 2022-08-12 MED ORDER — LORAZEPAM 2 MG/ML IJ SOLN
0.5000 mg | Freq: Once | INTRAMUSCULAR | Status: AC
  Administered 2022-08-12: 0.5 mg via INTRAVENOUS
  Filled 2022-08-12: qty 1

## 2022-08-12 MED ORDER — FUROSEMIDE 10 MG/ML IJ SOLN
80.0000 mg | Freq: Once | INTRAMUSCULAR | Status: AC
  Administered 2022-08-12: 80 mg via INTRAVENOUS
  Filled 2022-08-12: qty 8

## 2022-08-12 MED ORDER — SODIUM CHLORIDE 0.9 % IV SOLN: INTRAVENOUS | Status: DC

## 2022-08-12 MED ORDER — ASPIRIN 81 MG PO CHEW
324.0000 mg | CHEWABLE_TABLET | Freq: Once | ORAL | Status: AC
  Administered 2022-08-12: 324 mg via ORAL
  Filled 2022-08-12: qty 4

## 2022-08-12 NOTE — ED Notes (Signed)
Pt states CP started yesterday and subsided.  Today it re-occurred at 2pm, c/o mostly of SHOB at this time

## 2022-08-12 NOTE — ED Notes (Signed)
-  Called [facility] to activate Code Stemi at 747pm per EDP.

## 2022-08-12 NOTE — ED Notes (Signed)
Patient transported to CT 

## 2022-08-12 NOTE — ED Triage Notes (Signed)
Pt arrives with c/o SOB that has been going on for about a week, but has gotten worse tonight. Pt anxious in triage. Pt endorses chest pressure. Pt ambulatory to triage and talking in complete sentences.

## 2022-08-12 NOTE — ED Notes (Signed)
.  ass

## 2022-08-12 NOTE — Telephone Encounter (Signed)
I received a VM from patient about her furosemide refill. I called pt back. I saw in the notes that she called yesterday with SOB and was told to go to the ER, which she did not. She states she is still short of breath.I advised again for her to go to the ER.  I also spoke with Dr. Johney Frame who was in agreement that she should go to the ER as she has a hx of not following med instructions.  Patient states she will wait for her husband to get home and then they will go to the ER. She states she is a little uncomfortable but she is ok. I reminded her she can always call 911 and they can take her to ER.

## 2022-08-12 NOTE — ED Notes (Addendum)
Pt c/o more SHOB rate in the 30s, sats 100% on 2L RT at bedside

## 2022-08-12 NOTE — ED Notes (Signed)
Pt is very anxious, trying to get out of bed.  States she can not breathe, Lasix given, purewik in place, emotional support given.  EDP made aware

## 2022-08-12 NOTE — ED Notes (Signed)
Pt complaining of being SOB and ask for oxygen. Pt placed in 2 lpm O2 by N/C

## 2022-08-12 NOTE — Progress Notes (Deleted)
Cardiology Office Note:    Date:  08/12/2022   ID:  Sabrina Hernandez, DOB 10-14-1934, MRN [id number]  PCP:  [name], MD   [place]  Cardiologist:  [name], MD  Advanced Practice Provider:  No care team member to display Electrophysiologist:  None    Referring MD: [name],*     History of Present Illness:    Sabrina Hernandez is a 88 y.o. female with a hx of HTN, GERD and systolic heart failure who presents to clinic for follow-up of her HFrEF.   She was initially diagnosed with HFrEF in 2017 when she presented with worsening SOB. Was seen at [facility]. Declined cath at that time but myoview was negative for ischemia. She was initially on metop/ACE but these were later stopped due to fatigue. She has not followed regularly with a cardiologist. Last TTE was in 2017. No ICD in place.    Was seen in [facility] ED on 08/02/20 with worsening DOE over the past couple of months as well as orthopnea. In the ED, ECG with NSR, LAE, LBBB. LVH. TTE with LVEF 20-25% in 2017. BNP 1145. Trop 49>>53. COVID negative. She was diuresed with lasix IV with good response. She was discharged home with plans to follow-up with Cardiology for further management.   During our visit 08/09/20 the patient was doing better but continued to have SOB and orthopnea. BNP 4251. We resumed diuresis with lasix 56m BID.   During our visit on 08/24/20, the patient reported having black stools. On ASA but not AC. We stopped her ASA and referred to GI. CBC with HgB 15.1.  TTE on 08/29/20 with LVEF <20% with global hypokinesis. G1DD, mild MR, small to moderate pericardial effusion. Patient has adamently declined any invasive testing.  Had recurrent ER visit on 06/2021 with decompensated HF in the setting of not taking her lasix. BNP 1277 and CXR with pulmonary edema. She was given IV lasix and recommended for admission but she declined and wanted to go home.  Last saw  SRichardson Doppon 07/2021. She was feeling better but had stopped most of her HF medications.  Today, the patient overall feels better today. Has stopped the bystolic because she was having difficulty controlling her bowels. She states she stopped the other medications because she ran out of them and thought that was the end of their course. Denies SOB, chest pain, lightheadedness, dizziness, syncope. No LE edema. Has had to sleep propped up on pillows due to orthopnea. Wt today is 107lbs; dry weight is 103lbs.   Notably, [place]  Past Medical History:  Diagnosis Date   Diverticulitis    HFrEF (heart failure with reduced ejection fraction) ([facility]an [facility] 07/30/2021   Presumed nonischemic cardiomyopathy  [facility] EF 28, no ischemia Intolerant of metoprolol/ACE inhibitor due to fatigue Echo 2017: EF 20-25 Echocardiogram 10/01/2020:  EF <20, normal RVSF, trivial pericardial effusion Echocardiogram 08/30/2020: EF <20, global HK, GR 1 DD, normal RVSF, mild MR, AV sclerosis without stenosis, small-moderate pericardial effusion     Past Surgical History:  Procedure Laterality Date   CERVICAL CONE BIOPSY     HAMMER TOE SURGERY     right foot    ORIF PATELLA Left 01/29/2015   Procedure: OPEN REDUCTION INTERNAL (ORIF) FIXATION LEFT PATELLA;  Surgeon: MParalee Cancel MD;  Location: [facility] ORS;  Service: Orthopedics;  Laterality: Left;    Current Medications: No outpatient medications have been  marked as taking for the 08/15/22 encounter (Appointment) with [name], MD.     Allergies:   Patient has no known allergies.   Social History   Socioeconomic History   Marital status: Married    Spouse name: Herbie Baltimore   Number of children: 5   Years of education: Not on file   Highest education level: Not on file  Occupational History   Occupation: Teacher/instructor-retired    Fish farm manager: [employer]  Tobacco Use   Smoking status: Former   Smokeless tobacco:  Never  Scientific laboratory technician Use: Never used  Substance and Sexual Activity   Alcohol use: Yes    Comment: social    Drug use: No   Sexual activity: Not on file  Other Topics Concern   Not on file  Social History Narrative   She is from [place], [place] originally but has lived in [place] for 30 years. She would like to move back. She is married with 5 grown children (3 in [place] and 2 in [place]. She is a retired Psychologist, prison and probation services.   Social Determinants of Health   Financial Resource Strain: Not on file  Food Insecurity: Not on file  Transportation Needs: Not on file  Physical Activity: Not on file  Stress: Not on file  Social Connections: Not on file     Family History: The patient's family history includes Alcohol abuse in her father; Early death in her mother; Heart failure in her mother; Mental illness in her father; Stroke in her father.  ROS:   Please see the history of present illness.    Review of Systems  Constitutional:  Negative for chills and fever.  HENT:  Negative for hearing loss.   Eyes:  Negative for blurred vision and redness.  Respiratory:  Negative for shortness of breath.   Cardiovascular:  Negative for chest pain, palpitations, orthopnea, claudication, leg swelling and PND.  Gastrointestinal:  Negative for melena, nausea and vomiting.  Genitourinary:  Negative for dysuria.  Musculoskeletal:  Positive for joint pain. Negative for falls.  Neurological:  Negative for dizziness and loss of consciousness.  Endo/Heme/Allergies:  Negative for polydipsia.  Psychiatric/Behavioral:  Negative for substance abuse.     EKGs/Labs/Other Studies Reviewed:    The following studies were reviewed today: TTE September 11, 2020:  1. Left ventricular ejection fraction, by estimation, is <20%. The left  ventricle has severely decreased function. The left ventricle demonstrates  global hypokinesis. The left ventricular internal cavity size was mildly  dilated. Left ventricular  diastolic  parameters are consistent with Grade I diastolic dysfunction  (impaired relaxation). Elevated left ventricular end-diastolic pressure.   2. Right ventricular systolic function is normal. The right ventricular  size is normal. Tricuspid regurgitation signal is inadequate for assessing  PA pressure.   3. The mitral valve is degenerative. Mild mitral valve regurgitation. No  evidence of mitral stenosis.   4. The aortic valve is tricuspid. Aortic valve regurgitation is not  visualized. Mild aortic valve sclerosis is present, with no evidence of  aortic valve stenosis.   5. The inferior vena cava is normal in size with greater than 50%  respiratory variability, suggesting right atrial pressure of 3 mmHg.   6. A small to moderate pericardial effusion is present. The pericardial  effusion is circumferential with no evidence of pericardial tamponade.  Myoview 2017: 1. No clear evidence for reversibility or ischemia..   2. Dilated left ventricle with diffuse hypokinesia and paradoxical  motion along the  septal wall.   3. Left ventricular ejection fraction is 28%.   4. High-risk stress test findings*.    TTE 2017: Findings  Mitral Valve  Structurally normal mitral valve.  Mild to moderate mitral regurgitation by color flow doppler examination.  Aortic Valve  Structurally normal aortic valve with good leaflet mobility, and no  regurgitation by color flow doppler examination.  Tricuspid Valve  Tricuspid valve is structurally normal. Trace tricuspid regurgitation by color  flow doppler examination. Normal estimated pulmonary artery pressure  Pulmonic Valve  Pulmonic valve is structurally normal.  Mild pulmonic regurgitation present by color flow doppler examination.  No evidence of pulmonic stenosis.  Left Atrium  Left atrial volume index of 38.1 ml per meters squared BSA.  Moderately dilated left atrium.  Left Ventricle  Mildly dilated left ventricle.  Severe global LV hypokinesis.   Right Atrium  Normal right atrium.  Right Ventricle  Normal right ventricle structure and function.  Pericardial Effusion  No evidence of pericardial effusion.  Miscellaneous  The aorta is within normal limits.  The IVC is normal   EKG:  EKG 08/02/20: NSR, LBBB, lateral q waves  Recent Labs: 09/20/2021: Magnesium 2.2; NT-Pro BNP 8,788 07/17/2022: ALT 29; B Natriuretic Peptide 1,541.4 08/12/2022: BUN 15; Creatinine, Ser 1.06; Hemoglobin 12.7; Platelets 192; Potassium 3.5; Sodium 135  Recent Lipid Panel No results found for: "CHOL", "TRIG", "HDL", "CHOLHDL", "VLDL", "LDLCALC", "LDLDIRECT"   Physical Exam:    VS:  There were no vitals taken for this visit.    Wt Readings from Last 3 Encounters:  08/12/22 105 lb (47.6 kg)  07/21/22 108 lb 12.8 oz (49.4 kg)  07/17/22 104 lb (47.2 kg)     GEN:  Well nourished, well developed in no acute distress HEENT: Normal NECK: No JVD; No carotid bruits CARDIAC: RRR, no murmurs, rubs, gallops RESPIRATORY:  Clear to auscultation without rales, wheezing or rhonchi  ABDOMEN: Soft, non-tender, non-distended MUSCULOSKELETAL:  No edema; No deformity  SKIN: Warm and dry NEUROLOGIC:  Alert and oriented x 3 PSYCHIATRIC:  Normal affect   ASSESSMENT:    No diagnosis found.  PLAN:    In order of problems listed above:  #Acute on chronic systolic heart failure: LVEF <20%. Suspected non-ischemic CM with negative myoview in 2017, however, has not had cath. Recent acute decompensation with volume overload and elevated BNP. Currently improved with NYHA class II-III symptoms today with mild volume overload on exam. Declines any invasive work-up or intervention at this time. Only wants to proceed with medical management. Notably, she stopped all HF medications for unclear reasons. Will try to add back as tolerated.  -TTE with LVEF <20%, G1DD -Increase lasix to 89m BID for 3 days and then daily thereafter -Start K 18m BID and then daily  thereafter -Did not tolerate bisoprolol due to GI upset -Will trial back on entresto 24/2689mID (previously tolerated) -Will try to add back prior meds (metop, spiro, farxiga as able) -Will arrange for Pharm D visit for med titration -Check BMET, BNP and Mg today -Repeat BMET next week after lasix titration -Declined RHC/LHC or an invasive work-up event if shortens lifespan; wants only medical management -[place]; will need meds PRIOR to her leaving  #LE Cramping: Likely related to lasix and possible electrolyte abnormalities.  -Check BMET today -Start K 68m28mith each dose of lasix -Start Magnesium OTC daily  #Small to moderate pericardial effusion: Trivial on TTE 09/2020 -Continue to monitor   #Black Stool: Resolved.  Has not yet seen GI. Off ASA. CBC without anemia -Stopped ASA   #HTN: Well controlled. -Resume entresto as above  Discussed importance of taking her medications and letting us know if she does not tolerate them so we can adjust as needed. Our goal is to ensure she feels well and is able to have a quality of life.      Medication Adjustments/Labs and Tests Ordered: Current medicines are reviewed at length with the patient today.  Concerns regarding medicines are outlined above.  No orders of the defined types were placed in this encounter.  No orders of the defined types were placed in this encounter.   There are no Patient Instructions on file for this visit.    Signed, [name], MD  08/12/2022 8:43 PM    [place]

## 2022-08-13 ENCOUNTER — Other Ambulatory Visit: Payer: Self-pay

## 2022-08-13 DIAGNOSIS — Z7982 Long term (current) use of aspirin: Secondary | ICD-10-CM | POA: Diagnosis not present

## 2022-08-13 DIAGNOSIS — Z79899 Other long term (current) drug therapy: Secondary | ICD-10-CM | POA: Diagnosis not present

## 2022-08-13 DIAGNOSIS — I5020 Unspecified systolic (congestive) heart failure: Secondary | ICD-10-CM

## 2022-08-13 DIAGNOSIS — I5023 Acute on chronic systolic (congestive) heart failure: Secondary | ICD-10-CM | POA: Diagnosis not present

## 2022-08-13 DIAGNOSIS — K219 Gastro-esophageal reflux disease without esophagitis: Secondary | ICD-10-CM

## 2022-08-13 DIAGNOSIS — Z818 Family history of other mental and behavioral disorders: Secondary | ICD-10-CM | POA: Diagnosis not present

## 2022-08-13 DIAGNOSIS — Z91128 Patient's intentional underdosing of medication regimen for other reason: Secondary | ICD-10-CM | POA: Diagnosis not present

## 2022-08-13 DIAGNOSIS — Z91148 Patient's other noncompliance with medication regimen for other reason: Secondary | ICD-10-CM

## 2022-08-13 DIAGNOSIS — R072 Precordial pain: Secondary | ICD-10-CM | POA: Diagnosis present

## 2022-08-13 DIAGNOSIS — I447 Left bundle-branch block, unspecified: Secondary | ICD-10-CM | POA: Diagnosis present

## 2022-08-13 DIAGNOSIS — Z91199 Patient's noncompliance with other medical treatment and regimen due to unspecified reason: Secondary | ICD-10-CM | POA: Diagnosis not present

## 2022-08-13 DIAGNOSIS — J189 Pneumonia, unspecified organism: Secondary | ICD-10-CM | POA: Diagnosis present

## 2022-08-13 DIAGNOSIS — T447X6A Underdosing of beta-adrenoreceptor antagonists, initial encounter: Secondary | ICD-10-CM | POA: Diagnosis present

## 2022-08-13 DIAGNOSIS — I110 Hypertensive heart disease with heart failure: Secondary | ICD-10-CM | POA: Diagnosis present

## 2022-08-13 DIAGNOSIS — I8391 Asymptomatic varicose veins of right lower extremity: Secondary | ICD-10-CM

## 2022-08-13 DIAGNOSIS — Z87891 Personal history of nicotine dependence: Secondary | ICD-10-CM | POA: Diagnosis not present

## 2022-08-13 DIAGNOSIS — Z823 Family history of stroke: Secondary | ICD-10-CM | POA: Diagnosis not present

## 2022-08-13 DIAGNOSIS — J45909 Unspecified asthma, uncomplicated: Secondary | ICD-10-CM | POA: Diagnosis present

## 2022-08-13 DIAGNOSIS — Z8249 Family history of ischemic heart disease and other diseases of the circulatory system: Secondary | ICD-10-CM | POA: Diagnosis not present

## 2022-08-13 DIAGNOSIS — I700 Atherosclerosis of aorta: Secondary | ICD-10-CM | POA: Diagnosis present

## 2022-08-13 DIAGNOSIS — Z811 Family history of alcohol abuse and dependence: Secondary | ICD-10-CM | POA: Diagnosis not present

## 2022-08-13 DIAGNOSIS — F1010 Alcohol abuse, uncomplicated: Secondary | ICD-10-CM | POA: Diagnosis present

## 2022-08-13 DIAGNOSIS — I428 Other cardiomyopathies: Secondary | ICD-10-CM | POA: Diagnosis present

## 2022-08-13 DIAGNOSIS — I10 Essential (primary) hypertension: Secondary | ICD-10-CM

## 2022-08-13 DIAGNOSIS — Z1152 Encounter for screening for COVID-19: Secondary | ICD-10-CM | POA: Diagnosis not present

## 2022-08-13 DIAGNOSIS — R413 Other amnesia: Secondary | ICD-10-CM | POA: Diagnosis present

## 2022-08-13 DIAGNOSIS — E785 Hyperlipidemia, unspecified: Secondary | ICD-10-CM | POA: Diagnosis present

## 2022-08-13 LAB — CBC
HCT: 34.8 % — ABNORMAL LOW (ref 36.0–46.0)
HCT: 41.4 % (ref 36.0–46.0)
Hemoglobin: 11.7 g/dL — ABNORMAL LOW (ref 12.0–15.0)
Hemoglobin: 13.7 g/dL (ref 12.0–15.0)
MCH: 29.8 pg (ref 26.0–34.0)
MCH: 29.8 pg (ref 26.0–34.0)
MCHC: 33.6 g/dL (ref 30.0–36.0)
MCHC: 33.1 g/dL (ref 30.0–36.0)
MCV: 88.8 fL (ref 80.0–100.0)
MCV: 90.2 fL (ref 80.0–100.0)
Platelets: 170 10*3/uL (ref 150–400)
Platelets: 206 10*3/uL (ref 150–400)
RBC: 3.92 MIL/uL (ref 3.87–5.11)
RBC: 4.59 MIL/uL (ref 3.87–5.11)
RDW: 13.6 % (ref 11.5–15.5)
RDW: 13.6 % (ref 11.5–15.5)
WBC: 4 10*3/uL (ref 4.0–10.5)
WBC: 4.6 10*3/uL (ref 4.0–10.5)
nRBC: 0 % (ref 0.0–0.2)
nRBC: 0 % (ref 0.0–0.2)

## 2022-08-13 LAB — LIPID PANEL
Cholesterol: 169 mg/dL (ref 0–200)
HDL: 74 mg/dL (ref >40)
Total CHOL/HDL Ratio: 2.3 RATIO
Triglycerides: 58 mg/dL (ref <150)
VLDL: 12 mg/dL (ref 0–40)

## 2022-08-13 LAB — CREATININE, SERUM
Creatinine, Ser: 1.04 mg/dL — ABNORMAL HIGH (ref 0.44–1.00)
Creatinine, Ser: 1.79 mg/dL — ABNORMAL HIGH (ref 0.44–1.00)
GFR, Estimated: 53 mL/min — ABNORMAL LOW (ref >60)
GFR, Estimated: 27 mL/min — ABNORMAL LOW (ref >60)

## 2022-08-13 LAB — TROPONIN I (HIGH SENSITIVITY)
Troponin I (High Sensitivity): 96 ng/L — ABNORMAL HIGH (ref <18)
Troponin I (High Sensitivity): 85 ng/L — ABNORMAL HIGH (ref <18)

## 2022-08-13 LAB — MAGNESIUM: Magnesium: 1.9 mg/dL (ref 1.7–2.4)

## 2022-08-13 MED ORDER — ACETAMINOPHEN 325 MG PO TABS
650.0000 mg | ORAL_TABLET | ORAL | Status: DC | PRN
  Administered 2022-08-14: 650 mg via ORAL
  Filled 2022-08-13: qty 2

## 2022-08-13 MED ORDER — SACUBITRIL-VALSARTAN 24-26 MG PO TABS
1.0000 | ORAL_TABLET | Freq: Two times a day (BID) | ORAL | Status: DC
  Administered 2022-08-13 – 2022-08-14 (×2): 1 via ORAL
  Filled 2022-08-13 (×3): qty 1

## 2022-08-13 MED ORDER — SODIUM CHLORIDE 0.9% FLUSH
3.0000 mL | Freq: Two times a day (BID) | INTRAVENOUS | Status: DC
  Administered 2022-08-13: 3 mL via INTRAVENOUS

## 2022-08-13 MED ORDER — ACETAMINOPHEN 325 MG PO TABS: 650.0000 mg | ORAL_TABLET | ORAL | Status: DC | PRN

## 2022-08-13 MED ORDER — ADULT MULTIVITAMIN W/MINERALS CH
1.0000 | ORAL_TABLET | Freq: Every morning | ORAL | Status: DC
  Administered 2022-08-14: 1 via ORAL
  Filled 2022-08-13: qty 1

## 2022-08-13 MED ORDER — SODIUM CHLORIDE 0.9 % IV SOLN
500.0000 mg | Freq: Once | INTRAVENOUS | Status: AC
  Administered 2022-08-13: 500 mg via INTRAVENOUS
  Filled 2022-08-13: qty 5

## 2022-08-13 MED ORDER — SODIUM CHLORIDE 0.9% FLUSH
3.0000 mL | INTRAVENOUS | Status: DC | PRN
  Filled 2022-08-13: qty 3

## 2022-08-13 MED ORDER — HEPARIN SODIUM (PORCINE) 5000 UNIT/ML IJ SOLN
5000.0000 [IU] | Freq: Three times a day (TID) | INTRAMUSCULAR | Status: DC
  Administered 2022-08-13 – 2022-08-14 (×4): 5000 [IU] via SUBCUTANEOUS
  Filled 2022-08-13 (×4): qty 1

## 2022-08-13 MED ORDER — FUROSEMIDE 10 MG/ML IJ SOLN
40.0000 mg | Freq: Two times a day (BID) | INTRAMUSCULAR | Status: DC
  Administered 2022-08-13: 40 mg via INTRAVENOUS
  Filled 2022-08-13: qty 4

## 2022-08-13 MED ORDER — SODIUM CHLORIDE 0.9% FLUSH: 3.0000 mL | INTRAVENOUS | Status: DC | PRN

## 2022-08-13 MED ORDER — CARVEDILOL 3.125 MG PO TABS
3.1250 mg | ORAL_TABLET | Freq: Two times a day (BID) | ORAL | Status: DC
  Administered 2022-08-14: 3.125 mg via ORAL
  Filled 2022-08-13: qty 1

## 2022-08-13 MED ORDER — SODIUM CHLORIDE 0.9% FLUSH
3.0000 mL | Freq: Two times a day (BID) | INTRAVENOUS | Status: DC
  Administered 2022-08-13 (×3): 3 mL via INTRAVENOUS
  Filled 2022-08-13: qty 3

## 2022-08-13 MED ORDER — ONDANSETRON HCL 4 MG/2ML IJ SOLN: 4.0000 mg | Freq: Four times a day (QID) | INTRAMUSCULAR | Status: DC | PRN

## 2022-08-13 MED ORDER — SODIUM CHLORIDE 0.9 % IV SOLN: 250.0000 mL | INTRAVENOUS | Status: DC | PRN

## 2022-08-13 MED ORDER — POTASSIUM CHLORIDE CRYS ER 20 MEQ PO TBCR
20.0000 meq | EXTENDED_RELEASE_TABLET | Freq: Every day | ORAL | Status: DC
  Administered 2022-08-13 – 2022-08-14 (×2): 20 meq via ORAL
  Filled 2022-08-13 (×2): qty 1

## 2022-08-13 MED ORDER — ASPIRIN 81 MG PO TBEC
81.0000 mg | DELAYED_RELEASE_TABLET | Freq: Every day | ORAL | Status: DC
  Administered 2022-08-13 – 2022-08-14 (×2): 81 mg via ORAL
  Filled 2022-08-13 (×2): qty 1

## 2022-08-13 MED ORDER — NITROGLYCERIN 0.4 MG SL SUBL: 0.4000 mg | SUBLINGUAL_TABLET | SUBLINGUAL | Status: DC | PRN

## 2022-08-13 MED ORDER — SODIUM CHLORIDE 0.9 % IV SOLN
1.0000 g | Freq: Once | INTRAVENOUS | Status: AC
  Administered 2022-08-13: 1 g via INTRAVENOUS
  Filled 2022-08-13: qty 10

## 2022-08-13 MED ORDER — FUROSEMIDE 10 MG/ML IJ SOLN
40.0000 mg | Freq: Every day | INTRAMUSCULAR | Status: DC
  Filled 2022-08-13: qty 4

## 2022-08-13 NOTE — Progress Notes (Addendum)
This is a 88 year old female with past medical history of HFrEF EF<20%, HTN and noncompliance.  She presents to [facility] with one week of SOB.  She is not developing orthopnea and not having difficulty laying flat.  Today she developed substernal chest pressure and shortness of breath was worse.  She went to the ER.  In the ER her pulse ox was 89 to 90% on room air.  She was placed on 2 L oxygen but complained of air hunger, this was increased to 4 L and she is now more comfortable. Initial EKG was concerning for STEMI, code STEMI was called.  However, the cardiologist reviewed EKG and code STEMI was canceled.  He recommended follow-up troponins and if elevated reconsult.  Troponins have been flat at 85=>78.  Patient has a longstanding LBBB.  Patient BNP is 2,514 up from 1,541. CXR showed cardiomegaly, small bilateral pleural effusions, and central pulmonary vascular congestion.  Patient was given 80 mg IV Lasix, she is not able to lie on her side.  CTA chest showed patchy areas of airspace disease throughout the lungs, most prominent in the upper lung zones, progressing since prior study. Changes may represent multifocal pneumonia or possibly atypical pneumonia. Small left and moderate right pleural effusions with basilar atelectasis, similar to prior study. Cavitary lesion in the left apex is unchanged since prior study but remains indeterminate for infectious/inflammatory or neoplastic etiology. Recommend pulmonary consultation with follow-up to [facility].  Respiratory panel is negative.  Patient without complaints of fevers or chills or nausea or vomiting.  She denies any infectious symptoms.  Blood cultures x 2 collected in the ER.  Empiric antibiotics Rocephin and azithromycin given by EDP.  Per cardiology note -4/22 patient was started on SGLT2 with further optimization of GDMT with spironolactone and Entresto  -07/2021 patient discontinued her Entresto, metoprolol, spironolactone.   -encouraged to restart GDMT and was willing to start bisoprolol 2.5 mg  -08/2021 She is stopped her medication because she ran out.  -She declined any invasive workup at that time and patient was started on Lasix 20 mg twice daily and trial of Entresto.  -07/21/22 patient was in ER with the same and left AMA. -06/2022, seen office, was euvolemic.  Reports she ran out of all her medications the last 4-8 mths. She did not need to use them.  Had 2 pillow orthopnea.  Meds restarted  -Was in ER today.  Had to be convinced not to leave AMA by EDP Admitted requested for CHF.  No clinical signs of infection.  Cultures collected, antibiotics have not been resumed.  Defer to a.m. team pulmonary consult &/or antibiotic reinitiation.  QuantiFERON gold plus ordered.  Airborne isolation.

## 2022-08-13 NOTE — ED Provider Notes (Addendum)
[facility] [place] Provider Note   CSN: [id number] Arrival date & time: 08/12/22  1906     History  Chief Complaint  Patient presents with   Shortness of Breath    Sabrina Hernandez is a 88 y.o. female.  Patient presenting with shortness of breath for about a week.  Getting very difficult to lay flat.  Got worse today.  Patient anxious in triage.  Also with substernal chest pressure that started at around 1400 today.  Patient's oxygen saturations were originally at 96 which she dropped down to 89 and needed to be placed on oxygen.  Did not feel well until she got 4 L.  Patient is known to have acute on chronic congestive heart failure he was seen January 18 here but left AMA they were planning on admitting her.  Patient's followed by cardiology with [facility] cardiology group.  Past medical history significant for diverticulitis and nonischemic cardiomyopathy.  And the congestive heart failure.  Patient is on Lasix post be taking 20 mg once a day.  We do not have full meds here.  She took a dose of 20 mg last evening she took a dose again this morning.  She contacted cardiology yesterday about her shortness of breath and wanted to come in the ED she did not contact them again today to include the chest pressure and they said you need to come in so she came in.  Patient does not use oxygen at home.  Patient's original EKG left bundle branch block but raise some concerns about anterior lateral infarct.  Did contact STEMI cardiologist he reviewed both of them we did EKG twice.  He felt it was all left bundle branch block and not consistent with acute STEMI.  Initial STEMI in order set was ordered.  But patient did not receive any heparin.  Cardiology recommended getting the serial troponins and calling them back if they were positive.  Patient very anxious here.       Home Medications Prior to Admission medications   Medication Sig Start Date End Date  Taking? Authorizing Provider  furosemide (LASIX) 20 MG tablet TAKE 1 TABLET(20 MG) BY MOUTH DAILY 07/21/22   Marylu Lund., NP  Multiple Vitamin (MULTIVITAMIN WITH MINERALS) TABS tablet Take 1 tablet by mouth every morning.    [provider]  potassium chloride (KLOR-CON M) 10 MEQ tablet Take 1 tablet (10 mEq total) by mouth daily as needed. 07/21/22   Marylu Lund., NP  sacubitril-valsartan (ENTRESTO) 24-26 MG Take 1 tablet by mouth 2 (two) times daily. 07/21/22   Marylu Lund., NP      Allergies    Patient has no known allergies.    Review of Systems   Review of Systems  Constitutional:  Negative for chills and fever.  HENT:  Negative for ear pain and sore throat.   Eyes:  Negative for pain and visual disturbance.  Respiratory:  Positive for shortness of breath. Negative for cough.   Cardiovascular:  Positive for chest pain. Negative for palpitations.  Gastrointestinal:  Negative for abdominal pain and vomiting.  Genitourinary:  Negative for dysuria and hematuria.  Musculoskeletal:  Negative for arthralgias and back pain.  Skin:  Negative for color change and rash.  Neurological:  Negative for seizures and syncope.  Psychiatric/Behavioral:  The patient is nervous/anxious.   All other systems reviewed and are negative.   Physical Exam Updated Vital Signs BP (!) 92/57  Pulse (!) 107   Temp 97.8 F (36.6 C)   Resp 19   Ht 1.524 m (5')   Wt 47.6 kg   SpO2 91%   BMI 20.51 kg/m  Physical Exam Vitals and nursing note reviewed.  Constitutional:      General: She is in acute distress.     Appearance: Normal appearance. She is well-developed. She is not ill-appearing.  HENT:     Head: Normocephalic and atraumatic.     Mouth/Throat:     Mouth: Mucous membranes are moist.  Eyes:     Extraocular Movements: Extraocular movements intact.     Conjunctiva/sclera: Conjunctivae normal.     Pupils: Pupils are equal, round, and reactive to light.   Cardiovascular:     Rate and Rhythm: Regular rhythm. Tachycardia present.     Heart sounds: No murmur heard. Pulmonary:     Effort: Respiratory distress present.     Breath sounds: Normal breath sounds. No wheezing, rhonchi or rales.  Abdominal:     Palpations: Abdomen is soft.     Tenderness: There is no abdominal tenderness.  Musculoskeletal:        General: No swelling.     Cervical back: Normal range of motion and neck supple.     Right lower leg: No edema.     Left lower leg: No edema.  Skin:    General: Skin is warm and dry.     Capillary Refill: Capillary refill takes less than 2 seconds.  Neurological:     General: No focal deficit present.     Mental Status: She is alert and oriented to person, place, and time.     Cranial Nerves: No cranial nerve deficit.     Sensory: No sensory deficit.     Motor: No weakness.  Psychiatric:        Mood and Affect: Mood normal.     ED Results / Procedures / Treatments   Labs (all labs ordered are listed, but only abnormal results are displayed) Labs Reviewed  BASIC METABOLIC PANEL - Abnormal; Notable for the following components:      Result Value   Glucose, Bld 121 (*)    Creatinine, Ser 1.06 (*)    Calcium 8.4 (*)    GFR, Estimated 51 (*)    All other components within normal limits  BRAIN NATRIURETIC PEPTIDE - Abnormal; Notable for the following components:   B Natriuretic Peptide 2,514.0 (*)    All other components within normal limits  HEPATIC FUNCTION PANEL - Abnormal; Notable for the following components:   AST 55 (*)    Total Bilirubin 1.4 (*)    Indirect Bilirubin 1.2 (*)    All other components within normal limits  TROPONIN I (HIGH SENSITIVITY) - Abnormal; Notable for the following components:   Troponin I (High Sensitivity) 85 (*)    All other components within normal limits  TROPONIN I (HIGH SENSITIVITY) - Abnormal; Notable for the following components:   Troponin I (High Sensitivity) 78 (*)    All other  components within normal limits  RESP PANEL BY RT-PCR (RSV, FLU A&B, COVID)  RVPGX2  CBC  HEMOGLOBIN A1C  PROTIME-INR  APTT  CBC  DIFFERENTIAL  LIPID PANEL    EKG EKG Interpretation  Date/Time:  Tuesday August 12 2022 19:18:42 EST Ventricular Rate:  119 PR Interval:  92 QRS Duration: 141 QT Interval:  394 QTC Calculation: 555 R Axis:   150 Text Interpretation: Sinus or ectopic atrial tachycardia Left  bundle branch block Ventricular premature complex Anterolateral infarct, acute (LAD) Prolonged QT interval Confirmed by Fredia Sorrow [phone]) on 08/12/2022 7:26:44 PM  Radiology CT Angio Chest PE W/Cm &/Or Wo Cm  Result Date: 08/12/2022 CLINICAL DATA:  Pulmonary embolus suspected with high probability. Shortness of breath for about a week. EXAM: CT ANGIOGRAPHY CHEST WITH CONTRAST TECHNIQUE: Multidetector CT imaging of the chest was performed using the standard protocol during bolus administration of intravenous contrast. Multiplanar CT image reconstructions and MIPs were obtained to evaluate the vascular anatomy. RADIATION DOSE REDUCTION: This exam was performed according to the departmental dose-optimization program which includes automated exposure control, adjustment of the mA and/or kV according to patient size and/or use of iterative reconstruction technique. CONTRAST:  84m OMNIPAQUE IOHEXOL 350 MG/ML SOLN COMPARISON:  07/17/2022 FINDINGS: Cardiovascular: There is good opacification of the central and segmental pulmonary arteries. No focal filling defects. No evidence of significant pulmonary embolus. Diffuse cardiac enlargement with particular left ventricular dilation. No pericardial effusions. Normal caliber thoracic aorta. Calcification of the aorta and coronary arteries. Mediastinum/Nodes: Esophagus is decompressed. No significant lymphadenopathy. Thyroid gland is unremarkable. Lungs/Pleura: Small left and moderate right pleural effusions with basilar atelectasis. Patchy areas of  airspace consolidation in both lungs, most prominent in the upper lung zones. Changes demonstrate mild progression since previous study and possibly indicate multifocal pneumonia. Cavitary lesion in the left apex measuring 1.6 cm diameter is unchanged since prior study. This could be infectious or neoplastic and pulmonary consultation and follow-up after resolution of acute process is recommended. Could also consider atypical infection such as TB or fungal infection which could have cavitary lesions and multifocal areas of consolidation. Upper Abdomen: No acute abnormality. Musculoskeletal: Degenerative changes in the spine. Review of the MIP images confirms the above findings. IMPRESSION: 1. No evidence of significant pulmonary embolus. 2. Diffuse cardiac enlargement. 3. Small left and moderate right pleural effusions with basilar atelectasis, similar to prior study. 4. Patchy areas of airspace disease throughout the lungs, most prominent in the upper lung zones, progressing since prior study. Changes may represent multifocal pneumonia or possibly atypical pneumonia. 5. Cavitary lesion in the left apex is unchanged since prior study but remains indeterminate for infectious/inflammatory or neoplastic etiology. Recommend pulmonary consultation with follow-up to resolution. Electronically Signed   By: WLucienne CapersM.D.   On: 08/12/2022 22:15   DG Chest 2 View  Result Date: 08/12/2022 CLINICAL DATA:  Shortness of breath EXAM: CHEST - 2 VIEW COMPARISON:  Chest x-ray 07/17/2022.  CT chest 07/17/2022 FINDINGS: Heart is enlarged. There are small bilateral pleural effusions. There central pulmonary vascular congestion. There is stable scarring in the right upper lobe. No acute fractures are seen. IMPRESSION: Cardiomegaly, small bilateral pleural effusions, and central pulmonary vascular congestion. Electronically Signed   By: ARonney AstersM.D.   On: 08/12/2022 19:46    Procedures Procedures    Medications  Ordered in ED Medications  0.9 %  sodium chloride infusion ( Intravenous New Bag/Given 08/12/22 2034)  cefTRIAXone (ROCEPHIN) 1 g in sodium chloride 0.9 % 100 mL IVPB (has no administration in time range)  azithromycin (ZITHROMAX) 500 mg in sodium chloride 0.9 % 250 mL IVPB (has no administration in time range)  aspirin chewable tablet 324 mg (324 mg Oral Given 08/12/22 2025)  furosemide (LASIX) injection 80 mg (80 mg Intravenous Given 08/12/22 2125)  LORazepam (ATIVAN) injection 0.5 mg (0.5 mg Intravenous Given 08/12/22 2155)  iohexol (OMNIPAQUE) 350 MG/ML injection 75 mL (75 mLs Intravenous Contrast Given 08/12/22  2138)    ED Course/ Medical Decision Making/ A&P                             Medical Decision Making Amount and/or Complexity of Data Reviewed Labs: ordered. Radiology: ordered.  Risk OTC drugs. Prescription drug management. Decision regarding hospitalization.  CRITICAL CARE Performed by: Fredia Sorrow Total critical care time: 45 minutes Critical care time was exclusive of separately billable procedures and treating other patients. Critical care was necessary to treat or prevent imminent or life-threatening deterioration. Critical care was time spent personally by me on the following activities: development of treatment plan with patient and/or surrogate as well as nursing, discussions with consultants, evaluation of patient's response to treatment, examination of patient, obtaining history from patient or surrogate, ordering and performing treatments and interventions, ordering and review of laboratory studies, ordering and review of radiographic studies, pulse oximetry and re-evaluation of patient's condition.  Respiratory panel negative.  Both EKGs reviewed by cardiology not consistent with a STEMI.  Patient's initial troponin elevated at 85 but reassuring that it was not much higher.  Hepatic function panel total bili 1.4 AST 55 albumin 3.5.  Alk phos is 91.  Troponin  was 78 this down so no concerns for non-STEMI.  Patient has an oxygen requirement more comfortable on 4 L of oxygen.  Patient given 80 of Lasix and PureWick put in place but patient would keep it there.  Had about 200 cc of urine out but patient is now resting comfortably.  Was given 0.5 Ativan because she was so anxious so that she would not leave AMA again.  Patient's INR is normal.  CBC white count 5.1 hemoglobin 12.7 platelets 192.  Basic metabolic panel glucose [id number] creatinine 1.06 GFR 51.  BNP markedly elevated at 2500.  Because of the hypoxia CT angio done for further evaluation.  No evidence of significant pulmonary embolus.  Diffuse cardiac enlargement small left and moderate right pleural effusion with basilar atelectasis similar to prior study.  Patchy areas of airspace disease throughout the lungs most prominent in the upper lung zones progressing since prior studies changes may reflect multifocal pneumonia or possible atypical pneumonia.  Patient really does not have an elevated white blood cell count.  Cavitary lesion in the left apex is unchanged since prior study but remains interment of for infectious or neoplastic etiology recommend pulmonary consultation.  Will go ahead and start her on antibiotics.  But with no white blood cell count no fever and respiratory panel negative probably could be stopped.  Chest x-ray just showed bilateral pleural effusions and a small.  No evidence of a non-STEMI.  Does not seem to be an infectious process but based on that CT finding will start Rocephin and Zithromax.  Clinically suspect pulmonary edema with oxygen requirement.  Patient now comfortable on 3 L of oxygen.  Discussed with the hospitalist for admission.       Final Clinical Impression(s) / ED Diagnoses Final diagnoses:  Acute on chronic congestive heart failure, unspecified heart failure type ([place])  Pleural effusion  Precordial pain  Hypoxia  Multifocal pneumonia    Rx / DC  Orders ED Discharge Orders     None         Fredia Sorrow, MD 08/13/22 Crista Elliot, MD 08/13/22 0045

## 2022-08-13 NOTE — ED Notes (Signed)
Report called to Gaspar Skeeters, RN at [facility]. Will update as CL is enroute.

## 2022-08-13 NOTE — Progress Notes (Signed)
Patients husband arrived at bedside and states the patients missing items such as purse, wallet, check book, and phone are all at home.

## 2022-08-13 NOTE — H&P (Addendum)
History and Physical    DOA: 08/12/2022  PCP: [name], MD  Patient coming from: Home  Chief Complaint: Shortness of breath  HPI: Sabrina Hernandez is a 88 y.o. with history h/o hypertension, systolic CHF with low EF less than 20%, medical noncompliance presents from [facility] with complaints of progressive shortness of breath for a week associated with substernal chest discomfort.  Patient states she can walk about 3 blocks in her community without shortness of breath at baseline.  However over the last month or so she has had gradually declining exertional capacity due to dyspnea.  She was seen by cardiology clinic and prescribed cardiac medications last month for the same but patient states one of the medications made her urinate in the middle of the night soiling her clothes and sheets because of which she decided to stop taking all 3 medications prescribed through cardiology clinic.  She states at one point she was prescribed sublingual nitro as needed but she ran out of them as well.  She states for the last 2 days she has not only had persistent exertional dyspnea but also developed orthopnea requiring her to use 3 pillows to sleep.  This prompted her current ED visit.  She describes chest discomfort as "tightness" in the retrosternal area.  She denies any leg swellings or cough or hemoptysis or fevers or weight loss or night sweats.  ED course: Afebrile, noted to be hypoxic on arrival to the ED (89 to 90% on room air).  She was placed on 2 L nasal cannula and then increased to 4 L for air hunger.  EKG initially concerning for STEMI prompting emergent cardiology evaluation.  However, cardiology not convinced given unchanged EKG with chronic LBBB, high-sensitivity troponin resulted abnormal but flat at 85->78.  BNP elevated at 2514 (previously 1541).Chest x-ray resulted as cardiomegaly, pulmonary vascular congestion with small bilateral pleural effusions.  CTA chest showed patchy areas of  airspace disease throughout the lungs and most prominent in upper lung zones representing multifocal pneumonia versus atypical pneumonia/small bilateral effusions and basilar atelectasis, unchanged cavitary lesion in the left apex unchanged from prior study but concerning for infectious versus neoplastic etiology.  Pulmonary consultation and follow-up requested.  Patient received 80 mg IV Lasix in ED along with empiric antibiotics (Rocephin and azithromycin), blood cultures x 2 sent.  QuantiFERON gold plus ordered and patient admitted with airborne precautions.  Of note, patient wanted to leave AMA but EDP convinced patient to stay and transferred for further medical workup.  Per last cardiology visit in January, patient had left AMA at that time as well and reported to cardiologist that she ran out of all her medications-had not been using them for at least 4 to 8 months and reported two-pillow orthopnea at which time medications were refilled by cardiologist.   Review of Systems: As per HPI, otherwise review of systems negative.    Past Medical History:  Diagnosis Date   Diverticulitis    HFrEF (heart failure with reduced ejection fraction) ([place]) 07/30/2021   Presumed nonischemic cardiomyopathy  Myoview 4/17 [facility]): EF 28, no ischemia Intolerant of metoprolol/ACE inhibitor due to fatigue Echo 2017: EF 20-25 Echocardiogram 10/01/2020:  EF <20, normal RVSF, trivial pericardial effusion Echocardiogram 08/30/2020: EF <20, global HK, GR 1 DD, normal RVSF, mild MR, AV sclerosis without stenosis, small-moderate pericardial effusion     Past Surgical History:  Procedure Laterality Date   CERVICAL CONE BIOPSY     HAMMER TOE SURGERY  right foot    ORIF PATELLA Left 01/29/2015   Procedure: OPEN REDUCTION INTERNAL (ORIF) FIXATION LEFT PATELLA;  Surgeon: Paralee Cancel, MD;  Location: [facility];  Service: Orthopedics;  Laterality: Left;    Social history:  reports that she has quit smoking. She has never  used smokeless tobacco. She reports current alcohol use. She reports that she does not use drugs.   No Known Allergies  Family History  Problem Relation Age of Onset   Heart failure Mother    Early death Mother    Alcohol abuse Father    Mental illness Father    Stroke Father       Prior to Admission medications   Medication Sig Start Date End Date Taking? Authorizing Provider  furosemide (LASIX) 20 MG tablet TAKE 1 TABLET(20 MG) BY MOUTH DAILY 07/21/22   Marylu Lund., NP  Multiple Vitamin (MULTIVITAMIN WITH MINERALS) TABS tablet Take 1 tablet by mouth every morning.    [provider]  potassium chloride (KLOR-CON M) 10 MEQ tablet Take 1 tablet (10 mEq total) by mouth daily as needed. 07/21/22   Marylu Lund., NP  sacubitril-valsartan (ENTRESTO) 24-26 MG Take 1 tablet by mouth 2 (two) times daily. 07/21/22   Marylu Lund., NP    Physical Exam: Vitals:   08/13/22 0800 08/13/22 0822 08/13/22 1100 08/13/22 1442  BP: 116/75 116/75 123/79 135/79  Pulse: 86 89 92 (!) 111  Resp: 16 15 18 16  $ Temp:  97.8 F (36.6 C) 98 F (36.7 C) 99 F (37.2 C)  TempSrc:  Oral  Oral  SpO2: 97% 95% 97% 97%  Weight:    45.6 kg  Height:    5' (1.524 m)    Constitutional: NAD, calm, comfortable Eyes: PERRL, lids and conjunctivae normal ENMT: Mucous membranes are moist. Posterior pharynx clear of any exudate or lesions.Normal dentition.  Neck: normal, supple, no masses, no thyromegaly Respiratory: clear to auscultation bilaterally although decreased breath sounds on left apical area compared to right, no wheezing, no crackles. Normal respiratory effort. No accessory muscle use.  Cardiovascular: Regular rate and rhythm, no murmurs / rubs / gallops. No appreciable lower extremity edema. 2+ pedal pulses. No carotid bruits.  + Varicosities in right lower extremity noted Abdomen: no tenderness, no masses palpated. No hepatosplenomegaly. Bowel sounds positive.  Musculoskeletal: no  clubbing / cyanosis. No joint deformity upper and lower extremities. Good ROM, no contractures. Normal muscle tone.  Neurologic: CN 2-12 grossly intact. Sensation intact, DTR normal. Strength 5/5 in all 4.  Psychiatric: . Alert and oriented x 3.  Pleasant and cooperative during my interview but according to bedside nurse had expressed wanting to leave the hospital earlier.  SKIN/catheters: Significant varicosities in right lower extremity, no rashes or ulcers. No induration  Labs on Admission: I have personally reviewed following labs and imaging studies  CBC: Recent Labs  Lab 08/12/22 1923 08/12/22 2015 08/13/22 0330  WBC 5.4 5.1 4.0  NEUTROABS  --  3.6  --   HGB 13.0 12.7 11.7*  HCT 39.7 38.5 34.8*  MCV 90.6 89.7 88.8  PLT 201 192 [id number]   Basic Metabolic Panel: Recent Labs  Lab 08/12/22 1923 08/13/22 0330  NA 135  --   K 3.5  --   CL 104  --   CO2 24  --   GLUCOSE 121*  --   BUN 15  --   CREATININE 1.06* 1.04*  CALCIUM 8.4*  --    GFR:  Estimated Creatinine Clearance: 28.4 mL/min (A) (by C-G formula based on SCr of 1.04 mg/dL (H)). Recent Labs  Lab 08/12/22 1923 08/12/22 2015 08/13/22 0330  WBC 5.4 5.1 4.0   Liver Function Tests: Recent Labs  Lab 08/12/22 2141  AST 55*  ALT 43  ALKPHOS 91  BILITOT 1.4*  PROT 6.6  ALBUMIN 3.5   No results for input(s): "LIPASE", "AMYLASE" in the last 168 hours. No results for input(s): "AMMONIA" in the last 168 hours. Coagulation Profile: Recent Labs  Lab 08/12/22 2015  INR 1.1   Cardiac Enzymes: No results for input(s): "CKTOTAL", "CKMB", "CKMBINDEX", "TROPONINI" in the last 168 hours. BNP (last 3 results) Recent Labs    09/20/21 1209  PROBNP 8,788*   HbA1C: Recent Labs    08/12/22 2015  HGBA1C 5.0   CBG: No results for input(s): "GLUCAP" in the last 168 hours. Lipid Profile: Recent Labs    08/12/22 2141  CHOL 169  HDL 74  LDLCALC NOT CALCULATED  TRIG 58  CHOLHDL 2.3   Thyroid Function Tests: No  results for input(s): "TSH", "T4TOTAL", "FREET4", "T3FREE", "THYROIDAB" in the last 72 hours. Anemia Panel: No results for input(s): "VITAMINB12", "FOLATE", "FERRITIN", "TIBC", "IRON", "RETICCTPCT" in the last 72 hours. Urine analysis:    Component Value Date/Time   COLORURINE YELLOW 01/26/2015 0830   APPEARANCEUR CLEAR 01/26/2015 0830   LABSPEC 1.007 01/26/2015 0830   PHURINE 6.5 01/26/2015 0830   GLUCOSEU NEGATIVE 01/26/2015 0830   HGBUR SMALL (A) 01/26/2015 0830   BILIRUBINUR NEGATIVE 01/26/2015 0830   KETONESUR NEGATIVE 01/26/2015 0830   PROTEINUR NEGATIVE 01/26/2015 0830   UROBILINOGEN 0.2 01/26/2015 0830   NITRITE NEGATIVE 01/26/2015 0830   LEUKOCYTESUR NEGATIVE 01/26/2015 0830    Radiological Exams on Admission: Personally reviewed  CT Angio Chest PE W/Cm &/Or Wo Cm  Result Date: 08/12/2022 CLINICAL DATA:  Pulmonary embolus suspected with high probability. Shortness of breath for about a week. EXAM: CT ANGIOGRAPHY CHEST WITH CONTRAST TECHNIQUE: Multidetector CT imaging of the chest was performed using the standard protocol during bolus administration of intravenous contrast. Multiplanar CT image reconstructions and MIPs were obtained to evaluate the vascular anatomy. RADIATION DOSE REDUCTION: This exam was performed according to the departmental dose-optimization program which includes automated exposure control, adjustment of the mA and/or kV according to patient size and/or use of iterative reconstruction technique. CONTRAST:  82m OMNIPAQUE IOHEXOL 350 MG/ML SOLN COMPARISON:  07/17/2022 FINDINGS: Cardiovascular: There is good opacification of the central and segmental pulmonary arteries. No focal filling defects. No evidence of significant pulmonary embolus. Diffuse cardiac enlargement with particular left ventricular dilation. No pericardial effusions. Normal caliber thoracic aorta. Calcification of the aorta and coronary arteries. Mediastinum/Nodes: Esophagus is decompressed. No  significant lymphadenopathy. Thyroid gland is unremarkable. Lungs/Pleura: Small left and moderate right pleural effusions with basilar atelectasis. Patchy areas of airspace consolidation in both lungs, most prominent in the upper lung zones. Changes demonstrate mild progression since previous study and possibly indicate multifocal pneumonia. Cavitary lesion in the left apex measuring 1.6 cm diameter is unchanged since prior study. This could be infectious or neoplastic and pulmonary consultation and follow-up after resolution of acute process is recommended. Could also consider atypical infection such as TB or fungal infection which could have cavitary lesions and multifocal areas of consolidation. Upper Abdomen: No acute abnormality. Musculoskeletal: Degenerative changes in the spine. Review of the MIP images confirms the above findings. IMPRESSION: 1. No evidence of significant pulmonary embolus. 2. Diffuse cardiac enlargement. 3. Small left and  moderate right pleural effusions with basilar atelectasis, similar to prior study. 4. Patchy areas of airspace disease throughout the lungs, most prominent in the upper lung zones, progressing since prior study. Changes may represent multifocal pneumonia or possibly atypical pneumonia. 5. Cavitary lesion in the left apex is unchanged since prior study but remains indeterminate for infectious/inflammatory or neoplastic etiology. Recommend pulmonary consultation with follow-up to resolution. Electronically Signed   By: Lucienne Capers M.D.   On: 08/12/2022 22:15   DG Chest 2 View  Result Date: 08/12/2022 CLINICAL DATA:  Shortness of breath EXAM: CHEST - 2 VIEW COMPARISON:  Chest x-ray 07/17/2022.  CT chest 07/17/2022 FINDINGS: Heart is enlarged. There are small bilateral pleural effusions. There central pulmonary vascular congestion. There is stable scarring in the right upper lobe. No acute fractures are seen. IMPRESSION: Cardiomegaly, small bilateral pleural  effusions, and central pulmonary vascular congestion. Electronically Signed   By: Ronney Asters M.D.   On: 08/12/2022 19:46    EKG: Independently reviewed.  Sinus tachycardia, PVCs, QT prolongation at 533 ms   Assessment and Plan:   Principal Problem:   Acute on chronic systolic congestive heart failure, NYHA class 3 (HCC) Active Problems:   HTN (hypertension)   Gastroesophageal reflux disease without esophagitis   Reactive airway disease   HFrEF (heart failure with reduced ejection fraction) (HCC)   Non compliance w medication regimen    1.  Acute exacerbation of chronic systolic heart failure: Received 80 mg IV Lasix in the ED and admitted with Lasix 40 mg IV  daily, Entresto.  Noncompliant with beta-blockers prescribed previously.  Unclear if she was ever diagnosed with CAD.  Last echo in April 2022 showed EF less than 20%.  Will repeat echo.  Patient advised the risks of noncompliance with medications and also advised that she could titrate Lasix to as needed use once euvolemic.  Explained that other medications like aspirin, beta-blockers, nitrates do not have diuretic effect and should not be discontinued without medical advice.  She was also advised to take Lasix in the daytime to avoid nocturnal diuresis and sleep disruption.  She is willing to try medications and would like her daughter to be present during rounds tomorrow for further discussion send decision making.  2.  Abnormal CT/acute hypoxic respiratory failure: CT findings in the setting of problem #1 and acute versus chronic cavitary lung disease.  Currently under airborne isolation and QuantiFERON gold test sent.  Afebrile.  Did receive 1 dose of Rocephin and azithromycin in the ED.  Pulmonary consultation in a.m. and further treatment course as advised by them.  Requiring O2 2 -4 liters nasal cannula-treat underlying etiology and taper as needed.  Appears to be improving with diuresis and was able to converse comfortably  without O2 during my interview.  3.  Chest pain/troponinemia: Received 1 dose of aspirin in the ED.  Had 2 doses of troponin at [facility] which were abnormal at 85->78 (previously at 55).  Will obtain 2 more sets to trend.  No acute chest pain currently.  SL nitro available as needed.  Will initiate beta-blockers  4.  Prolonged QT interval: Avoid QT prolonging agents.  Keep potassium close to 4 and magnesium close to 2.  Beta-blockers added.  5.  GERD: Does not recall having this problem and denies using PPI  6. Hypertension/hyperlipidemia: Resume prior meds.  Lipid profile in AM.  7. Varicose veins in RLE, asymptomatic--patient states she uses Ted hose occassionally but denies any problems like pain or  edema. Ted hose ordered, diuresis as above  8. Long history of medical noncompliance: Patient counseled the importance of medication compliance to avoid recurrent hospitalization and disease progression.Patient also discouraged against signing out AMA. She agreed to receive necessary treatments, pulmonary consultation and stated her daughter plans to visit her tonight and will be here tomorrow morning during rounds as well.  Per cardiology note -4/22 patient was started on SGLT2 with further optimization of GDMT with spironolactone and Entresto  -07/2021 patient discontinued her Entresto, metoprolol, spironolactone.  -encouraged to restart GDMT and was willing to start bisoprolol 2.5 mg  -08/2021 She is stopped her medication because she ran out.  -She declined any invasive workup at that time and patient was started on Lasix 20 mg twice daily and trial of Entresto.  -07/21/22 patient was in ER with the same and left AMA. -06/2022, seen office, was euvolemic.  Reports she ran out of all her medications the last 4-8 mths. She did not need to use them.  Had 2 pillow orthopnea.  Meds restarted   DVT prophylaxis: Heparin SQ  COVID screen: Negative  Code Status: Full code per discussion with patient    .Health care proxy would be her daughter and husband.  She would like her daughter Secundino Ginger to be called first in case of emergency  Patient/Family Communication: Discussed with patient and all questions answered to satisfaction.  Consults called: Please request pulmonary consult in a.m. Admission status :I certify that at the point of admission it is my clinical judgment that the patient will require inpatient hospital care spanning beyond 2 midnights from the point of admission due to high intensity of service and high frequency of surveillance required.Inpatient status is judged to be reasonable and necessary in order to provide the required intensity of service to ensure the patient's safety. The patient's presenting symptoms, physical exam findings, and initial radiographic and laboratory data in the context of their chronic comorbidities is felt to place them at high risk for further clinical deterioration. The following factors support the patient status of inpatient : Acute CHF exacerbation with hypoxic respiratory failure requiring IV diuretics.  Cavitary lung lesion requiring pulmonary evaluation.     Guilford Shi MD [facility] Pager in [place]  If 7PM-7AM, please contact night-coverage [email or web address]   08/13/2022, 5:52 PM

## 2022-08-13 NOTE — ED Notes (Signed)
Security was given information to look into using camera footage, to attempt to locate the wallet being handed to registration. No further findings noted at this time.   Security advised the husband was allowed to check the safe and see there was no wallet turned over, and the log book was also empty regarding a tri-fold wallet matching her description.
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
## 2022-08-13 NOTE — ED Notes (Signed)
Report given to [facility] RN @ [facility]

## 2022-08-13 NOTE — ED Notes (Signed)
Report given to Sonia Side, Paramedic at [place]. ETA 15 mins.

## 2022-08-13 NOTE — ED Notes (Signed)
Spouse is leaving now to go home please call if she should get a Bed before 8 am
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
## 2022-08-14 ENCOUNTER — Other Ambulatory Visit (HOSPITAL_COMMUNITY): Payer: Medicare Other

## 2022-08-14 DIAGNOSIS — I5023 Acute on chronic systolic (congestive) heart failure: Secondary | ICD-10-CM | POA: Diagnosis not present

## 2022-08-14 LAB — BASIC METABOLIC PANEL
Anion gap: 12 (ref 5–15)
BUN: 22 mg/dL (ref 8–23)
CO2: 27 mmol/L (ref 22–32)
Calcium: 8.6 mg/dL — ABNORMAL LOW (ref 8.9–10.3)
Chloride: 97 mmol/L — ABNORMAL LOW (ref 98–111)
Creatinine, Ser: 1.43 mg/dL — ABNORMAL HIGH (ref 0.44–1.00)
GFR, Estimated: 36 mL/min — ABNORMAL LOW (ref >60)
Glucose, Bld: 93 mg/dL (ref 70–99)
Potassium: 3.4 mmol/L — ABNORMAL LOW (ref 3.5–5.1)
Sodium: 136 mmol/L (ref 135–145)

## 2022-08-14 LAB — LIPID PANEL
Cholesterol: 169 mg/dL (ref 0–200)
HDL: 66 mg/dL (ref >40)
LDL Cholesterol: 92 mg/dL (ref 0–99)
Total CHOL/HDL Ratio: 2.6 RATIO
Triglycerides: 53 mg/dL (ref <150)
VLDL: 11 mg/dL (ref 0–40)

## 2022-08-14 MED ORDER — AMOXICILLIN-POT CLAVULANATE 500-125 MG PO TABS: 1.0000 | ORAL_TABLET | Freq: Three times a day (TID) | ORAL | 0 refills | Status: AC

## 2022-08-14 MED ORDER — POTASSIUM CHLORIDE CRYS ER 10 MEQ PO TBCR: 10.0000 meq | EXTENDED_RELEASE_TABLET | Freq: Every day | ORAL | 1 refills | Status: DC | PRN

## 2022-08-14 MED ORDER — CARVEDILOL 3.125 MG PO TABS: 3.1250 mg | ORAL_TABLET | Freq: Two times a day (BID) | ORAL | 0 refills | Status: DC

## 2022-08-14 MED ORDER — ASPIRIN 81 MG PO TBEC: 81.0000 mg | DELAYED_RELEASE_TABLET | Freq: Every day | ORAL | 0 refills | Status: DC

## 2022-08-14 MED ORDER — ENTRESTO 24-26 MG PO TABS: 1.0000 | ORAL_TABLET | Freq: Two times a day (BID) | ORAL | 1 refills | Status: DC

## 2022-08-14 MED ORDER — FUROSEMIDE 20 MG PO TABS: ORAL_TABLET | ORAL | 1 refills | Status: DC

## 2022-08-14 NOTE — Plan of Care (Signed)

## 2022-08-14 NOTE — Discharge Summary (Signed)
Physician Discharge Summary  Sabrina Hernandez [id number] DOB: 1935/03/15 DOA: 08/12/2022  PCP: [name], MD  Admit date: 08/13/2022 Discharge date: 08/14/2022  Time spent: 45 minutes  Recommendations for Outpatient Follow-up:  PCP in 1 week Urgent pulmonary referral sent   Discharge Diagnoses:  Principal Problem:   Acute on chronic systolic congestive heart failure, NYHA class 3 ([place]) Cavitary lung lesion ?  Multifocal pneumonia   HTN (hypertension)   Gastroesophageal reflux disease without esophagitis   Reactive airway disease   HFrEF (heart failure with reduced ejection fraction) (HCC)   Non compliance w medication regimen   Discharge Condition: Stable  Diet recommendation: Low-sodium, heart healthy  Filed Weights   08/13/22 1442 08/14/22 0045 08/14/22 0449  Weight: 45.6 kg 44.2 kg 46.6 kg    History of present illness:  87/F with history of hypertension, chronic systolic CHF, [id number], noncompliance presented to the ED with progressive dyspnea X 1 week.  She stopped taking all her medicines few months ago on account of frequent urination and some report of diarrhea.  Very unreliable historian.  In the ER BNP was 2514, troponin 85, chest x-ray noted cardiomegaly pulmonary vascular congestion and small bilateral effusions, CTA chest showed patchy areas of airspace density throughout the lungs, prominent in upper lobes suggesting multifocal versus atypical pneumonia and unchanged cavitary lesion in left apex.  Hospital Course:   Acute on chronic systolic CHF  -Known cardiomyopathy with EF less than 20%, discontinued all her medicines few months ago, recently seen in CHF clinic, restarted on meds which she self discontinued immediately. -Only minimally volume overloaded on admission yesterday, diuresed with IV Lasix in the ER overnight, this morning she is without symptoms and is adamant to leave or otherwise threatening to leave AMA.  Vital stable, no hypoxia at  this time, -Restarted on Entresto, Coreg and low-dose Lasix at discharge, I am emphasized importance of compliance with meds, discussed with family to take charge of her medical -Home health RN also set up  ?  Pneumonia and cavitary lung lesion -Etiology is unclear, admitted overnight and she is adamant to leave AMA this morning  -Denies symptoms of dysphagia, started on Augmentin X 7 days, sent urgent pulmonary clinic referral for close follow-up to determine next course of action for cavitary lesion  Noncompliance, mild cognitive deficits and mild memory loss but still functional and fairly independent at home, set up with home health RN, advised family to take charge of her medications and recommended close follow-up with PCP in 1 week   Discharge Exam: Vitals:   08/14/22 0449 08/14/22 1000  BP: 111/62 94/65  Pulse: (!) 106 80  Resp: 20 15  Temp: 98 F (36.7 C) 97.7 F (36.5 C)  SpO2:  99%   Gen: Awake, Alert, Oriented X 2, mild cognitive deficits,  HEENT: no JVD Lungs: Good air movement bilaterally, CTAB CVS: S1S2/RRR Abd: soft, Non tender, non distended, BS present Extremities: No edema Skin: no new rashes on exposed skin   Discharge Instructions   Discharge Instructions     Amb Referral to HF Clinic   Complete by: As directed    Ambulatory referral to Pulmonology   Complete by: As directed    Cavitary lung lesion   Reason for referral: Other   Diet - low sodium heart healthy   Complete by: As directed    Increase activity slowly   Complete by: As directed       Allergies as of 08/14/2022   No Known  Allergies      Medication List     TAKE these medications    amoxicillin-clavulanate 500-125 MG tablet Commonly known as: Augmentin Take 1 tablet by mouth 3 (three) times daily for 7 days.   aspirin EC 81 MG tablet Take 1 tablet (81 mg total) by mouth daily. Swallow whole. Start taking on: August 15, 2022   carvedilol 3.125 MG tablet Commonly known  as: COREG Take 1 tablet (3.125 mg total) by mouth 2 (two) times daily with a meal.   Entresto 24-26 MG Generic drug: sacubitril-valsartan Take 1 tablet by mouth 2 (two) times daily.   furosemide 20 MG tablet Commonly known as: LASIX TAKE 1 TABLET(20 MG) BY MOUTH DAILY   multivitamin with minerals Tabs tablet Take 1 tablet by mouth every morning.   potassium chloride 10 MEQ tablet Commonly known as: KLOR-CON M Take 1 tablet (10 mEq total) by mouth daily as needed.       No Known Allergies  Follow-up Information     [name], MD. Go on 08/25/2022.   Specialty: Family Medicine Why: @11$ :Max Sane information: [place] [address] [phone]         [facility], [facility] Follow up.   Specialty: [place] Why: Agency will call you to set up apt times Contact information: [place] [address] [place] [address] [phone]                  The results of significant diagnostics from this hospitalization (including imaging, microbiology, ancillary and laboratory) are listed below for reference.    Significant Diagnostic Studies: CT Angio Chest PE W/Cm &/Or Wo Cm  Result Date: 08/12/2022 CLINICAL DATA:  Pulmonary embolus suspected with high probability. Shortness of breath for about a week. EXAM: CT ANGIOGRAPHY CHEST WITH CONTRAST TECHNIQUE: Multidetector CT imaging of the chest was performed using the standard protocol during bolus administration of intravenous contrast. Multiplanar CT image reconstructions and MIPs were obtained to evaluate the vascular anatomy. RADIATION DOSE REDUCTION: This exam was performed according to the departmental dose-optimization program which includes automated exposure control, adjustment of the mA and/or kV according to patient size and/or use of iterative reconstruction technique. CONTRAST:  67m OMNIPAQUE IOHEXOL 350 MG/ML SOLN COMPARISON:  07/17/2022 FINDINGS:  Cardiovascular: There is good opacification of the central and segmental pulmonary arteries. No focal filling defects. No evidence of significant pulmonary embolus. Diffuse cardiac enlargement with particular left ventricular dilation. No pericardial effusions. Normal caliber thoracic aorta. Calcification of the aorta and coronary arteries. Mediastinum/Nodes: Esophagus is decompressed. No significant lymphadenopathy. Thyroid gland is unremarkable. Lungs/Pleura: Small left and moderate right pleural effusions with basilar atelectasis. Patchy areas of airspace consolidation in both lungs, most prominent in the upper lung zones. Changes demonstrate mild progression since previous study and possibly indicate multifocal pneumonia. Cavitary lesion in the left apex measuring 1.6 cm diameter is unchanged since prior study. This could be infectious or neoplastic and pulmonary consultation and follow-up after resolution of acute process is recommended. Could also consider atypical infection such as TB or fungal infection which could have cavitary lesions and multifocal areas of consolidation. Upper Abdomen: No acute abnormality. Musculoskeletal: Degenerative changes in the spine. Review of the MIP images confirms the above findings. IMPRESSION: 1. No evidence of significant pulmonary embolus. 2. Diffuse cardiac enlargement. 3. Small left and moderate right pleural effusions with basilar atelectasis, similar to prior study. 4. Patchy areas of airspace disease throughout the lungs, most prominent  in the upper lung zones, progressing since prior study. Changes may represent multifocal pneumonia or possibly atypical pneumonia. 5. Cavitary lesion in the left apex is unchanged since prior study but remains indeterminate for infectious/inflammatory or neoplastic etiology. Recommend pulmonary consultation with follow-up to resolution. Electronically Signed   By: Lucienne Capers M.D.   On: 08/12/2022 22:15   DG Chest 2  View  Result Date: 08/12/2022 CLINICAL DATA:  Shortness of breath EXAM: CHEST - 2 VIEW COMPARISON:  Chest x-ray 07/17/2022.  CT chest 07/17/2022 FINDINGS: Heart is enlarged. There are small bilateral pleural effusions. There central pulmonary vascular congestion. There is stable scarring in the right upper lobe. No acute fractures are seen. IMPRESSION: Cardiomegaly, small bilateral pleural effusions, and central pulmonary vascular congestion. Electronically Signed   By: Ronney Asters M.D.   On: 08/12/2022 19:46   CT Angio Chest PE W and/or Wo Contrast  Result Date: 07/17/2022 CLINICAL DATA:  Evaluate for pulmonary embolism. Shortness of breath for 1-2 weeks. History of CHF. EXAM: CT ANGIOGRAPHY CHEST WITH CONTRAST TECHNIQUE: Multidetector CT imaging of the chest was performed using the standard protocol during bolus administration of intravenous contrast. Multiplanar CT image reconstructions and MIPs were obtained to evaluate the vascular anatomy. RADIATION DOSE REDUCTION: This exam was performed according to the departmental dose-optimization program which includes automated exposure control, adjustment of the mA and/or kV according to patient size and/or use of iterative reconstruction technique. CONTRAST:  56m OMNIPAQUE IOHEXOL 350 MG/ML SOLN COMPARISON:  None Available. FINDINGS: Cardiovascular: Satisfactory opacification of the pulmonary arteries to the segmental level. No evidence of pulmonary embolism. Cardiac enlargement. Aortic atherosclerosis. Mediastinum/Nodes: No enlarged mediastinal, hilar, or axillary lymph nodes. Thyroid gland, trachea, and esophagus demonstrate no significant findings. Lungs/Pleura: Small bilateral pleural effusions are identified, right greater than left. Interlobular septal thickening is identified. Mild patchy ground-glass attenuation identified within both upper lobes scratch set patchy areas of ground-glass attenuation are identified bilaterally. Biapical  pleuroparenchymal scarring. Within the apical segment of the left upper lobe there is a lung nodule with some internal cavitation which measures 1.9 x 1.2 cm, image 45/7 and image 10/5. Upper Abdomen: No acute abnormality. Musculoskeletal: No chest wall abnormality. No acute or significant osseous findings. Review of the MIP images confirms the above findings. IMPRESSION: 1. No evidence for acute pulmonary embolus. 2. Cardiac enlargement, small bilateral pleural effusions and interstitial edema compatible with CHF. 3. Mild patchy ground-glass attenuation is identified within both upper lobes. Findings are nonspecific but may reflect underlying pulmonary edema. 4. There is a lung nodule with internal cavitation identified within the apical segment of the left upper lobe. This measures 1.9 x 1.2 cm. This is nonspecific and may be inflammatory or infectious in etiology. Neoplasm cannot be excluded. Recommend referral to pulmonary medicine or thoracic surgery for further management. 5.  Aortic Atherosclerosis (ICD10-I70.0). Electronically Signed   By: TKerby MoorsM.D.   On: 07/17/2022 05:52   DG Chest 2 View  Result Date: 07/17/2022 CLINICAL DATA:  Shortness of breath EXAM: CHEST - 2 VIEW COMPARISON:  07/30/2021 FINDINGS: Cardiomegaly, vascular congestion. Left lower lobe opacity is similar prior study. This could reflect atelectasis or scarring. Biapical scarring. No effusions or acute bony abnormality. IMPRESSION: Cardiomegaly, vascular congestion. Bibasilar scarring.  Left lower lobe atelectasis or scarring. Electronically Signed   By: KRolm BaptiseM.D.   On: 07/17/2022 03:39    Microbiology: Recent Results (from the past 240 hour(s))  Resp panel by RT-PCR (RSV, Flu A&B, Covid) Nasopharyngeal Swab  Status: None   Collection Time: 08/12/22 10:03 PM   Specimen: Nasopharyngeal Swab; Nasal Swab  Result Value Ref Range Status   SARS Coronavirus 2 by RT PCR NEGATIVE NEGATIVE Final    Comment:  (NOTE) SARS-CoV-2 target nucleic acids are NOT DETECTED.  The SARS-CoV-2 RNA is generally detectable in upper respiratory specimens during the acute phase of infection. The lowest concentration of SARS-CoV-2 viral copies this assay can detect is 138 copies/mL. A negative result does not preclude SARS-Cov-2 infection and should not be used as the sole basis for treatment or other patient management decisions. A negative result may occur with  improper specimen collection/handling, submission of specimen other than nasopharyngeal swab, presence of viral mutation(s) within the areas targeted by this assay, and inadequate number of viral copies(<138 copies/mL). A negative result must be combined with clinical observations, patient history, and epidemiological information. The expected result is Negative.  Fact Sheet for Patients:  EntrepreneurPulse.com.au  Fact Sheet for Healthcare Providers:  IncredibleEmployment.be  This test is no t yet approved or cleared by the [place] FDA and  has been authorized for detection and/or diagnosis of SARS-CoV-2 by FDA under an Emergency Use Authorization (EUA). This EUA will remain  in effect (meaning this test can be used) for the duration of the COVID-19 declaration under Section 564(b)(1) of the Act, 21 U.S.C.section 360bbb-3(b)(1), unless the authorization is terminated  or revoked sooner.       Influenza A by PCR NEGATIVE NEGATIVE Final   Influenza B by PCR NEGATIVE NEGATIVE Final    Comment: (NOTE) The Xpert Xpress SARS-CoV-2/FLU/RSV plus assay is intended as an aid in the diagnosis of influenza from Nasopharyngeal swab specimens and should not be used as a sole basis for treatment. Nasal washings and aspirates are unacceptable for Xpert Xpress SARS-CoV-2/FLU/RSV testing.  Fact Sheet for Patients: EntrepreneurPulse.com.au  Fact Sheet for Healthcare  Providers: IncredibleEmployment.be  This test is not yet approved or cleared by the [place] FDA and has been authorized for detection and/or diagnosis of SARS-CoV-2 by FDA under an Emergency Use Authorization (EUA). This EUA will remain in effect (meaning this test can be used) for the duration of the COVID-19 declaration under Section 564(b)(1) of the Act, 21 U.S.C. section 360bbb-3(b)(1), unless the authorization is terminated or revoked.     Resp Syncytial Virus by PCR NEGATIVE NEGATIVE Final    Comment: (NOTE) Fact Sheet for Patients: EntrepreneurPulse.com.au  Fact Sheet for Healthcare Providers: IncredibleEmployment.be  This test is not yet approved or cleared by the [place] FDA and has been authorized for detection and/or diagnosis of SARS-CoV-2 by FDA under an Emergency Use Authorization (EUA). This EUA will remain in effect (meaning this test can be used) for the duration of the COVID-19 declaration under Section 564(b)(1) of the Act, 21 U.S.C. section 360bbb-3(b)(1), unless the authorization is terminated or revoked.  Performed at [facility], [address] [place], [place] [address]      Labs: Basic Metabolic Panel: Recent Labs  Lab 08/12/22 1923 08/13/22 0330 08/13/22 2000 08/14/22 0110  NA 135  --   --  136  K 3.5  --   --  3.4*  CL 104  --   --  97*  CO2 24  --   --  27  GLUCOSE 121*  --   --  93  BUN 15  --   --  22  CREATININE 1.06* 1.04* 1.79* 1.43*  CALCIUM 8.4*  --   --  8.6*  MG  --   --  1.9  --    Liver Function Tests: Recent Labs  Lab 08/12/22 2141  AST 55*  ALT 43  ALKPHOS 91  BILITOT 1.4*  PROT 6.6  ALBUMIN 3.5   No results for input(s): "LIPASE", "AMYLASE" in the last 168 hours. No results for input(s): "AMMONIA" in the last 168 hours. CBC: Recent Labs  Lab 08/12/22 1923 08/12/22 2015 08/13/22 0330 08/13/22 2000  WBC 5.4 5.1 4.0 4.6  NEUTROABS   --  3.6  --   --   HGB 13.0 12.7 11.7* 13.7  HCT 39.7 38.5 34.8* 41.4  MCV 90.6 89.7 88.8 90.2  PLT 201 192 170 206   Cardiac Enzymes: No results for input(s): "CKTOTAL", "CKMB", "CKMBINDEX", "TROPONINI" in the last 168 hours. BNP: BNP (last 3 results) Recent Labs    07/17/22 0401 08/12/22 1923  BNP 1,541.4* 2,514.0*    ProBNP (last 3 results) Recent Labs    09/20/21 1209  PROBNP 8,788*    CBG: No results for input(s): "GLUCAP" in the last 168 hours.     Signed:  Domenic Polite MD.  [facility] 08/14/2022, 12:07 PM

## 2022-08-14 NOTE — TOC Transition Note (Signed)
Transition of Care [facility]) - CM/SW Discharge Note   Patient Details  Name: Sabrina Hernandez MRN: [id number] Date of Birth: 03/13/1935  Transition of Care [facility]) CM/SW Contact:  Zenon Mayo, RN Phone Number: 08/14/2022, 11:18 AM   Clinical Narrative:    Patient is for dc today, NCM spoke with daughter on the phone, she will be transporting patient back home today, she states she would like patient to have a [place] for CHF management , NCM offered choice, she states she does not have a preference.  NCM made referral to [facility] with [facility] for [facility].  He is able to take referral.  Soc will begin 24 to 48 hrs post dc.    Final next level of care: Home w Home Health Services Barriers to Discharge: No Barriers Identified   Patient Goals and CMS Choice CMS Medicare.gov Compare Post Acute Care list provided to:: Patient Represenative (must comment) Choice offered to / list presented to : Adult Children  Discharge Placement                         Discharge Plan and Services Additional resources added to the After Visit Summary for                  DME Arranged: N/A         HH Arranged: RN, Disease Management HH Agency: [place] Date [facility] Agency Contacted: 08/14/22 Time [place]: 1118 Representative spoke with at [place]: [place] Determinants of Health ([place]) Interventions SDOH Screenings   Food Insecurity: No Food Insecurity (08/13/2022)  Housing: Low Risk  (08/13/2022)  Transportation Needs: No Transportation Needs (08/13/2022)  Utilities: Not At Risk (08/13/2022)  Depression (PHQ2-9): Medium Risk (07/09/2018)  Tobacco Use: Medium Risk (08/12/2022)     Readmission Risk Interventions     No data to display

## 2022-08-14 NOTE — Progress Notes (Signed)
Patient dressed and is trying to leave her room 'i am ready to leave and my family is coming to leave'. Attending MD made aware and is in room to discuss patients diagnosis and treatment plan. Patient is insistent on leaving. Family and patient providing education on heart failure and importance of taking medications. Support was providing to family who have concerns of her coming right back. Patient continues to express desire to discharge and would rather not stay despite recommendations .

## 2022-08-14 NOTE — Progress Notes (Signed)
Physical Therapy Evaluation Patient Details Name: Sabrina Hernandez MRN: [id number] DOB: 07-Mar-1935 Today's Date: 08/14/2022  History of Present Illness  88 y.o. presents to [facility] 08/12/22 with complaints of progressive shortness of breath for a week associated with substernal chest discomfort. Transferred to [facility] 2/14. PMH: hypertension, systolic CHF with low EF less than 20%, medical noncompliance  Clinical Impression  Patient evaluated by Physical Therapy with no further acute PT needs identified. All education has been completed and the patient has no further questions.  Pt has no follow-up Physical Therapy or equipment needs. PT is signing off. Thank you for this referral.        Recommendations for follow up therapy are one component of a multi-disciplinary discharge planning process, led by the attending physician.  Recommendations may be updated based on patient status, additional functional criteria and insurance authorization.  Follow Up Recommendations Outpatient PT      Assistance Recommended at Discharge PRN  Patient can return home with the following  Assistance with cooking/housework;Assist for transportation    Equipment Recommendations None recommended by PT  Recommendations for Other Services       Functional Status Assessment Patient has had a recent decline in their functional status and demonstrates the ability to make significant improvements in function in a reasonable and predictable amount of time.     Precautions / Restrictions Precautions Precautions: None Restrictions Weight Bearing Restrictions: No      Mobility  Bed Mobility Overal bed mobility: Independent                  Transfers Overall transfer level: Independent                      Ambulation/Gait Ambulation/Gait assistance: Independent Gait Distance (Feet): 60 Feet   Gait Pattern/deviations: WFL(Within Functional Limits), Step-through pattern Gait velocity: WFL Gait  velocity interpretation: <1.31 ft/sec, indicative of household ambulator (limited by size of room and precautions)   General Gait Details: able to ambulate in room without AD, no LoB, able to independently navigate around numerous obstacles      Balance Overall balance assessment: No apparent balance deficits (not formally assessed)                                           Pertinent Vitals/Pain Pain Assessment Pain Assessment: No/denies pain    Home Living Family/patient expects to be discharged to:: Private residence Living Arrangements: Spouse/significant other Available Help at Discharge: Available 24 hours/day Type of Home: House Home Access: Stairs to enter Entrance Stairs-Rails: None Entrance Stairs-Number of Steps: 2 Alternate Level Stairs-Number of Steps: flight (but does not go upstairs) Home Layout: Two level;Able to live on main level with bedroom/bathroom Home Equipment: Rollator (4 wheels);Shower seat - built in      Prior Function Prior Level of Function : Independent/Modified Independent             Mobility Comments: reports increasingly  more difficult to ambulate distances          Extremity/Trunk Assessment   Upper Extremity Assessment Upper Extremity Assessment: Overall WFL for tasks assessed    Lower Extremity Assessment Lower Extremity Assessment: Overall WFL for tasks assessed    Cervical / Trunk Assessment Cervical / Trunk Assessment: Kyphotic  Communication   Communication: No difficulties  Cognition Arousal/Alertness: Awake/alert Behavior During Therapy: WFL for tasks assessed/performed Overall  Cognitive Status: Impaired/Different from baseline Area of Impairment: Safety/judgement                         Safety/Judgement: Decreased awareness of deficits     General Comments: pt has decreased awareness of her medical status and it's effect on her endurance and energy consumption        General  Comments General comments (skin integrity, edema, etc.): Educated on need for energy conservation and gradual build up in endurance by beginning with numerous small bouts of activty throughout the day. Husband and daughter present and all voiced understanding'    Exercises     Assessment/Plan    PT Assessment Patient does not need any further PT services;All further PT needs can be met in the next venue of care  PT Problem List Decreased activity tolerance;Cardiopulmonary status limiting activity           PT Goals (Current goals can be found in the Care Plan section)  Acute Rehab PT Goals Patient Stated Goal: get back to walking in neighborhood PT Goal Formulation: All assessment and education complete, DC therapy     AM-PAC PT "6 Clicks" Mobility  Outcome Measure Help needed turning from your back to your side while in a flat bed without using bedrails?: None Help needed moving from lying on your back to sitting on the side of a flat bed without using bedrails?: None Help needed moving to and from a bed to a chair (including a wheelchair)?: None Help needed standing up from a chair using your arms (e.g., wheelchair or bedside chair)?: None Help needed to walk in hospital room?: None Help needed climbing 3-5 steps with a railing? : None 6 Click Score: 24    End of Session   Activity Tolerance: Patient tolerated treatment well Patient left: in bed;with family/visitor present Nurse Communication: Mobility status PT Visit Diagnosis: Difficulty in walking, not elsewhere classified (R26.2)    Time: [id number] PT Time Calculation (min) (ACUTE ONLY): 19 min   Charges:   PT Evaluation $PT Eval Low Complexity: 1 Low          Hashir Deleeuw B. Migdalia Dk PT, DPT Acute Rehabilitation Services Please use secure chat or  Call Office [phone]   [place] 08/14/2022, 1:21 PM

## 2022-08-14 NOTE — Plan of Care (Signed)

## 2022-08-14 NOTE — Progress Notes (Addendum)
Heart Failure Navigator Progress Note  Assessed for Heart & Vascular TOC clinic readiness.   Admitted with shortness of breath, orthopnea, and chest discomfort. Known HFrEF with EF <20% and medication noncompliance. Discussed with Dr. Broadus John, and due to noncompliance and history of leaving AMA, will not scheduled HF TOC appt at this time.  Navigator available for reassessment of patient.   Kerby Nora, PharmD, BCPS Heart Failure Stewardship Pharmacist Phone [phone]

## 2022-08-15 ENCOUNTER — Ambulatory Visit: Payer: Medicare Other | Admitting: Cardiology

## 2022-08-15 ENCOUNTER — Telehealth: Payer: Self-pay | Admitting: Family Medicine

## 2022-08-15 ENCOUNTER — Telehealth: Payer: Self-pay

## 2022-08-15 NOTE — Transitions of Care (Post Inpatient/ED Visit) (Signed)
   08/15/2022  Name: Sabrina Hernandez MRN: [id number] DOB: 25-Feb-1935  Today's TOC FU Call Status: Today's TOC FU Call Status:: Successful TOC FU Call Competed TOC FU Call Complete Date: 08/15/22  Transition Care Management Follow-up Telephone Call Date of Discharge: 08/14/22 Discharge Facility: [facility] Type of Discharge: Inpatient Admission Primary Inpatient Discharge Diagnosis:: Acute on chronic congestive heart failure How have you been since you were released from the hospital?: Better Any questions or concerns?: No  Items Reviewed: Did you receive and understand the discharge instructions provided?: Yes Medications obtained and verified?: Yes (Medications Reviewed) Any new allergies since your discharge?: No Dietary orders reviewed?: Yes Type of Diet Ordered:: Low sodium, heart healthy Do you have support at home?: Yes People in Home: child(ren), adult Name of Support/Comfort Primary Source: daughter  [place] and Equipment/Supplies: [place] Ordered?: Yes Name of [place]:: [place] Has Agency set up a time to come to your home?: No EMR reviewed for [place] Orders: Orders present/patient has not received call (refer to CM for follow-up) Any new equipment or medical supplies ordered?: No  Functional Questionnaire: Do you need assistance with bathing/showering or dressing?: No Do you need assistance with meal preparation?: No Do you need assistance with eating?: No Do you have difficulty maintaining continence: No Do you need assistance with getting out of bed/getting out of a chair/moving?: No Do you have difficulty managing or taking your medications?: No  Folllow up appointments reviewed: PCP Follow-up appointment confirmed?: Yes Date of PCP follow-up appointment?: 08/25/22 Follow-up Provider: Dr. Ethelene Hal Specialist [facility] Follow-up appointment confirmed?: NA Do you need transportation to your follow-up appointment?: No Do you understand  care options if your condition(s) worsen?: Yes-patient verbalized understanding  SDOH Interventions Today    Flowsheet Row Most Recent Value  SDOH Interventions   Food Insecurity Interventions Intervention Not Indicated  Housing Interventions Intervention Not Indicated      Johnney Killian, RN, BSN, CCM Care Management Coordinator [facility] Phone: [phone]: [phone]

## 2022-08-15 NOTE — Telephone Encounter (Signed)
Sabrina Hernandez from [facility]   Nadine needs BP parameters for the pts records. You can leave a message if needed.

## 2022-08-17 DIAGNOSIS — K219 Gastro-esophageal reflux disease without esophagitis: Secondary | ICD-10-CM | POA: Diagnosis not present

## 2022-08-17 DIAGNOSIS — Z7982 Long term (current) use of aspirin: Secondary | ICD-10-CM | POA: Diagnosis not present

## 2022-08-17 DIAGNOSIS — I700 Atherosclerosis of aorta: Secondary | ICD-10-CM | POA: Diagnosis not present

## 2022-08-17 DIAGNOSIS — J9811 Atelectasis: Secondary | ICD-10-CM | POA: Diagnosis not present

## 2022-08-17 DIAGNOSIS — J45909 Unspecified asthma, uncomplicated: Secondary | ICD-10-CM | POA: Diagnosis not present

## 2022-08-17 DIAGNOSIS — R911 Solitary pulmonary nodule: Secondary | ICD-10-CM | POA: Diagnosis not present

## 2022-08-17 DIAGNOSIS — I429 Cardiomyopathy, unspecified: Secondary | ICD-10-CM | POA: Diagnosis not present

## 2022-08-17 DIAGNOSIS — F32A Depression, unspecified: Secondary | ICD-10-CM | POA: Diagnosis not present

## 2022-08-17 DIAGNOSIS — Z9181 History of falling: Secondary | ICD-10-CM | POA: Diagnosis not present

## 2022-08-17 DIAGNOSIS — I110 Hypertensive heart disease with heart failure: Secondary | ICD-10-CM | POA: Diagnosis not present

## 2022-08-17 DIAGNOSIS — I5023 Acute on chronic systolic (congestive) heart failure: Secondary | ICD-10-CM | POA: Diagnosis not present

## 2022-08-18 ENCOUNTER — Telehealth: Payer: Self-pay | Admitting: Cardiology

## 2022-08-18 ENCOUNTER — Telehealth: Payer: Self-pay | Admitting: Family Medicine

## 2022-08-18 LAB — QUANTIFERON-TB GOLD PLUS (RQFGPL)
QuantiFERON Mitogen Value: >10 IU/mL
QuantiFERON Nil Value: 0.09 IU/mL
QuantiFERON TB1 Ag Value: 0.12 IU/mL
QuantiFERON TB2 Ag Value: 0.11 IU/mL

## 2022-08-18 LAB — QUANTIFERON-TB GOLD PLUS: QuantiFERON-TB Gold Plus: NEGATIVE

## 2022-08-18 NOTE — Telephone Encounter (Signed)
Spoke with Justice Rocher who verbally understood patients BP should be less than 140/90 and oxygen levels report if anything less than 93%

## 2022-08-18 NOTE — Telephone Encounter (Signed)
  Home Health verbal orders Caller Name: [facility] Name: [place] number: [phone]  Requesting OT/PT/Skilled nursing/Social Work/Speech:Skilled nursing  Reason:  Frequency:2w/2, 1w/2 and once every 2 weeks for 5 weeks  Please forward to [facility] pool or providers CMA

## 2022-08-18 NOTE — Telephone Encounter (Signed)
Deatra Canter home health nurse working with the pt is calling in to ask on behalf on the pt, how much alcohol she can consume in a day.  Rosamaria Lints to endorse to the pt that she should refrain from any alcohol and inquire this question to Ambrose Pancoast NP, when he see's the pt for post hospital follow-up on this week on 2/23.  Tonya verbalized understanding and agrees with this plan.  She will relay this to the pt and have her ask Jaquelyn Bitter this week when she see's him for follow-up.

## 2022-08-18 NOTE — Telephone Encounter (Signed)
Verbal orders given for below patient declined PT and OT. Written orders will be faxed

## 2022-08-18 NOTE — Telephone Encounter (Signed)
Pt's home health nurse would like a callback regarding pt wanting to know how mch alcohol she is able to drink. Nurse states that pt has a 3oz glass of liquor and 1-2 glasses of wine everyday at dinner. Please advise

## 2022-08-21 DIAGNOSIS — I110 Hypertensive heart disease with heart failure: Secondary | ICD-10-CM | POA: Diagnosis not present

## 2022-08-21 DIAGNOSIS — I5023 Acute on chronic systolic (congestive) heart failure: Secondary | ICD-10-CM | POA: Diagnosis not present

## 2022-08-21 DIAGNOSIS — I429 Cardiomyopathy, unspecified: Secondary | ICD-10-CM | POA: Diagnosis not present

## 2022-08-21 DIAGNOSIS — F32A Depression, unspecified: Secondary | ICD-10-CM | POA: Diagnosis not present

## 2022-08-21 DIAGNOSIS — J45909 Unspecified asthma, uncomplicated: Secondary | ICD-10-CM | POA: Diagnosis not present

## 2022-08-21 DIAGNOSIS — R911 Solitary pulmonary nodule: Secondary | ICD-10-CM | POA: Diagnosis not present

## 2022-08-21 NOTE — Progress Notes (Signed)
Office Visit    Patient Name: Sabrina Hernandez Date of Encounter: 08/21/2022  Primary Care Provider:  [name], MD Primary Cardiologist:  [name], MD Primary Electrophysiologist: None  Chief Complaint    Sabrina Hernandez is a 88 y.o. female with PMH of HFrEF, GERD, HTN, LBBB who presents today for acute on chronic CHF and follow-up of recent hospitalization.  Past Medical History    Past Medical History:  Diagnosis Date   Diverticulitis    HFrEF (heart failure with reduced ejection fraction) ([place]) 07/30/2021   Presumed nonischemic cardiomyopathy  Myoview 4/17 ([facility]): EF 28, no ischemia Intolerant of metoprolol/ACE inhibitor due to fatigue Echo 2017: EF 20-25 Echocardiogram 10/01/2020:  EF <20, normal RVSF, trivial pericardial effusion Echocardiogram 08/30/2020: EF <20, global HK, GR 1 DD, normal RVSF, mild MR, AV sclerosis without stenosis, small-moderate pericardial effusion    Past Surgical History:  Procedure Laterality Date   CERVICAL CONE BIOPSY     HAMMER TOE SURGERY     right foot    ORIF PATELLA Left 01/29/2015   Procedure: OPEN REDUCTION INTERNAL (ORIF) FIXATION LEFT PATELLA;  Surgeon: Paralee Cancel, MD;  Location: [facility] ORS;  Service: Orthopedics;  Laterality: Left;    Allergies  No Known Allergies  History of Present Illness    Sabrina Hernandez  is a 88 year old female with the above mention past medical history who presents today for acute on chronic CHF following recent [facility] ED visit. She was initially diagnosed with HFrEF in 2017 when she presented with worsening SOB. Was seen at [facility]. Declined cath at that time but myoview was negative for ischemia. .  Sabrina Hernandez was initially seen by Dr. Johney Frame 08/09/2020 following referral by PCP for management of HFrEF.  She had complained of DOE over previous few months and was seen in the ED 08/02/2020 and found to have exacerbation of CHF with BNP of 1145 and troponins trending 49-53.  She was  diuresed with IV Lasix with good response and discharged with plans to follow-up with cardiology.  2D echo was completed and showed EF of less than 20% with decreased LV function and global hypokinesis with grade 1 DD and small pericardial effusion.  Repeat echo completed 09/2020 to evaluate progression of pericardial effusion that showed improvement with same EF.  Started on Lasix 20 mg twice daily and patient was started on SGLT2 with further optimization of GDMT with spironolactone and Entresto.  She was seen in follow-up 07/2021 by [name], PA and patient noted that she had discontinued her Entresto, metoprolol, spironolactone.  She was noted to be up 3 pounds on her weight.  She was noted to have elevated heart rate and was encouraged to restart GDMT and was willing to start bisoprolol 2.5 mg.  She was seen by Dr. Johney Frame 09/20/2021 and patient was reportedly doing well but reported difficulty controlling her bowels.  She is stopped her medication because she ran out.  She declined any invasive workup at that time and patient was started on Lasix 20 mg twice daily and trial of Entresto with Pharm.D. consultation placed.  She was seen by Pharm.D./7/23 and was tolerating Entresto and was placed on spironolactone.  She was seen by [name], PA on 10/30/2021 and reported doing well overall.  She noted some brief episodes of sharp chest pain on the right but no significant shortness of breath.  She declined any invasive testing for chest discomfort and left for candidate for 4  months.  She was seen in the ED on 07/17/2022 with complaint of shortness of breath.  Her BNP was noted to be 1500 and troponins were mildly elevated.  Chest x-ray was shows signs of pulmonary congestion and.  She underwent a cardiac CTA to rule out PE that showed bilateral pleural effusions and no evidence of PE.  She received 20 mg of Lasix with diuresis.  She was noted to be tachycardia and tachypneic with ambulation.  She was  recommended for inpatient workup however patient left AMA prior to being transported to [facility] for admission.  She was seen in follow-up on 07/22/2022 and was started back on her GDMT with Entresto, Lasix, and potassium supplement.  Patient however contacted office 4 days later and had stopped all medications due to diarrhea.  Advised patient to follow-up in the ED for further evaluation.  She presented to the ED 08/13/2022 with complaint of shortness of breath orthopnea and chest discomfort.  2D echo was completed showing EF of less than 20%.  Patient was evaluated by heart failure Pharm.D. and refused to schedule TOC visit with heart failure clinic patient's BMP was 2514 and troponin was 85 with chest x-ray revealing pulmonary vascular congestion and small bilateral effusions.  CT of the chest showed patchy areas of airspace density.  Patient was restarted on Entresto, Coreg and low-dose Lasix.    Sabrina Hernandez presents today for posthospital follow-up alone.  Since last being seen in the office patient reports she is doing much better and breathing without any difficulty.  She reports compliance with her current medication regimen and denies any adverse reactions.  She is currently euvolemic on examination and reports that her daughter is helping her abstain from excess salt in her diet.  She she was interested in knowing how much alcohol is appropriate to drink with her current medications.  I advised her that abstinence from alcohol would be recommended especially with her current EF.  She was also advised to moderate her alcohol drinking and patient was in agreement.  During her visit today we reviewed her medication regimen and questions were answered regarding the need for each medication.  She expressed a full understanding of her current medications and is in agreement to continue at this time.  She has resumed walking around [place] and reports no shortness of breath with this activity.  Patient  denies chest pain, palpitations, dyspnea, PND, orthopnea, nausea, vomiting, dizziness, syncope, edema, weight gain, or early satiety.   Home Medications    Current Outpatient Medications  Medication Sig Dispense Refill   amoxicillin-clavulanate (AUGMENTIN) 500-125 MG tablet Take 1 tablet by mouth 3 (three) times daily for 7 days. 21 tablet 0   aspirin EC 81 MG tablet Take 1 tablet (81 mg total) by mouth daily. Swallow whole. 30 tablet 0   carvedilol (COREG) 3.125 MG tablet Take 1 tablet (3.125 mg total) by mouth 2 (two) times daily with a meal. 60 tablet 0   furosemide (LASIX) 20 MG tablet TAKE 1 TABLET(20 MG) BY MOUTH DAILY 30 tablet 1   Multiple Vitamin (MULTIVITAMIN WITH MINERALS) TABS tablet Take 1 tablet by mouth every morning.     potassium chloride (KLOR-CON M) 10 MEQ tablet Take 1 tablet (10 mEq total) by mouth daily as needed. 30 tablet 1   sacubitril-valsartan (ENTRESTO) 24-26 MG Take 1 tablet by mouth 2 (two) times daily. 60 tablet 1   No current facility-administered medications for this visit.     Review of  Systems  Please see the history of present illness.    All other systems reviewed and are otherwise negative except as noted above.  Physical Exam    Wt Readings from Last 3 Encounters:  08/14/22 102 lb 11.8 oz (46.6 kg)  07/21/22 108 lb 12.8 oz (49.4 kg)  07/17/22 104 lb (47.2 kg)   [id number] were no vitals filed for this visit.,There is no height or weight on file to calculate BMI.  Constitutional:      Appearance: Healthy appearance. Not in distress.  Neck:     Vascular: JVD normal.  Pulmonary:     Effort: Pulmonary effort is normal.     Breath sounds: No wheezing. No rales. Diminished in the bases Cardiovascular:     Normal rate. Regular rhythm. Normal S1. Normal S2.      Murmurs: There is no murmur.  Edema:    Peripheral edema absent.  Abdominal:     Palpations: Abdomen is soft non tender. There is no hepatomegaly.  Skin:    General: Skin is warm and  dry.  Neurological:     General: No focal deficit present.     Mental Status: Alert and oriented to person, place and time.     Cranial Nerves: Cranial nerves are intact.  EKG/LABS/Other Studies Reviewed    ECG personally reviewed by me today -none completed today   Lab Results  Component Value Date   WBC 4.6 08/13/2022   HGB 13.7 08/13/2022   HCT 41.4 08/13/2022   MCV 90.2 08/13/2022   PLT 206 08/13/2022   Lab Results  Component Value Date   CREATININE 1.43 (H) 08/14/2022   BUN 22 08/14/2022   NA 136 08/14/2022   K 3.4 (L) 08/14/2022   CL 97 (L) 08/14/2022   CO2 27 08/14/2022   Lab Results  Component Value Date   ALT 43 08/12/2022   AST 55 (H) 08/12/2022   ALKPHOS 91 08/12/2022   BILITOT 1.4 (H) 08/12/2022   Lab Results  Component Value Date   CHOL 169 08/14/2022   HDL 66 08/14/2022   LDLCALC 92 08/14/2022   TRIG 53 08/14/2022   CHOLHDL 2.6 08/14/2022    Lab Results  Component Value Date   HGBA1C 5.0 08/12/2022    Assessment & Plan    1.  HFrEF: -Patient recently admitted with shortness of breath and found to have elevated -Today patient is euvolemic on examination and has been compliant with her current medication regimen. -She was advised to continue GDMT with carvedilol 3.125 mg twice daily, Lasix 20 mg daily, Entresto 24/26 mg daily -We will add Jardiance 10 mg daily today with plan to hopefully add spironolactone if patient's blood pressures are not soft at follow-up. -Low sodium diet, fluid restriction <2L, and daily weights encouraged. Educated to contact our office for weight gain of 2 lbs overnight or 5 lbs in one week.    2.  Essential hypertension: -Patient's blood pressure today was well-controlled at 108/60 -Continue carvedilol 3.125 mg twice daily   3.  Shortness of breath: She reports today that her breathing has been improved and she is able to walk around her neighborhood without any shortness of breath. -Continue Lasix 20 mg  4.   Aortic atherosclerosis: -Patient underwent a recent CT of the chest to rule out PE that revealed atherosclerosis. -Patient declined further coronary workup at this time.  5.  EtOH abuse: -Patient was encouraged to decrease and moderate her alcohol intake and lieu of her decreased  LV function and current medication regimen.  Disposition: Follow-up with [name], MD or APP in 1 months   Medication Adjustments/Labs and Tests Ordered: Current medicines are reviewed at length with the patient today.  Concerns regarding medicines are outlined above.   Signed, Mable Fill, Marissa Nestle, NP 08/21/2022, 8:35 PM [facility]  Note:  This document was prepared using Dragon voice recognition software and may include unintentional dictation errors.

## 2022-08-22 ENCOUNTER — Encounter: Payer: Self-pay | Admitting: Nurse Practitioner

## 2022-08-22 ENCOUNTER — Ambulatory Visit (INDEPENDENT_AMBULATORY_CARE_PROVIDER_SITE_OTHER): Payer: Medicare Other | Admitting: Nurse Practitioner

## 2022-08-22 VITALS — BP 108/60 | HR 65 | Ht 60.0 in | Wt 106.0 lb

## 2022-08-22 DIAGNOSIS — I5020 Unspecified systolic (congestive) heart failure: Secondary | ICD-10-CM | POA: Diagnosis not present

## 2022-08-22 MED ORDER — POTASSIUM CHLORIDE CRYS ER 10 MEQ PO TBCR: 10.0000 meq | EXTENDED_RELEASE_TABLET | ORAL | 1 refills | Status: DC

## 2022-08-22 MED ORDER — EMPAGLIFLOZIN 10 MG PO TABS: 10.0000 mg | ORAL_TABLET | Freq: Every day | ORAL | 3 refills | Status: DC

## 2022-08-22 NOTE — Patient Instructions (Addendum)
Medication Instructions:  START Jardiance '10mg'$  Take 1 tablet once a day TAKE 1 tablet of Potassium on Mon/Wed/Fri ONLY  *If you need a refill on your cardiac medications before your next appointment, please call your pharmacy*   Lab Work: TODAY-BMET & MAG  If you have labs (blood work) drawn today and your tests are completely normal, you will receive your results only by: [place] (if you have MyChart) OR A paper copy in the mail If you have any lab test that is abnormal or we need to change your treatment, we will call you to review the results.   Testing/Procedures: NONE ORDERED   Follow-Up: At [facility], you and your health needs are our priority.  As part of our continuing mission to provide you with exceptional heart care, we have created designated Provider Care Teams.  These Care Teams include your primary Cardiologist (physician) and Advanced Practice Providers (APPs -  Physician Assistants and Nurse Practitioners) who all work together to provide you with the care you need, when you need it.  We recommend signing up for the patient portal called "MyChart".  Sign up information is provided on this After Visit Summary.  MyChart is used to connect with patients for Virtual Visits (Telemedicine).  Patients are able to view lab/test results, encounter notes, upcoming appointments, etc.  Non-urgent messages can be sent to your provider as well.   To learn more about what you can do with MyChart, go to [email or web address].    Your next appointment:   1 month(s)  Provider:   [name], MD  or Ambrose Pancoast, NP       Other Instructions

## 2022-08-25 ENCOUNTER — Encounter: Payer: Self-pay | Admitting: Family Medicine

## 2022-08-25 ENCOUNTER — Ambulatory Visit (INDEPENDENT_AMBULATORY_CARE_PROVIDER_SITE_OTHER): Payer: Medicare Other | Admitting: Family Medicine

## 2022-08-25 VITALS — BP 118/68 | HR 65 | Temp 97.2°F | Ht 60.0 in | Wt 105.4 lb

## 2022-08-25 DIAGNOSIS — Z789 Other specified health status: Secondary | ICD-10-CM

## 2022-08-25 DIAGNOSIS — F341 Dysthymic disorder: Secondary | ICD-10-CM

## 2022-08-25 DIAGNOSIS — R4189 Other symptoms and signs involving cognitive functions and awareness: Secondary | ICD-10-CM | POA: Diagnosis not present

## 2022-08-25 MED ORDER — SERTRALINE HCL 25 MG PO TABS: 25.0000 mg | ORAL_TABLET | Freq: Every day | ORAL | 1 refills | Status: DC

## 2022-08-25 NOTE — Progress Notes (Signed)
Established Patient Office Visit   Subjective:  Patient ID: Sabrina Hernandez, female    DOB: [birth year]  Age: 88 y.o. MRN: [id number]  Chief Complaint  Patient presents with   Hospitalization [facility] follow up     HPI Encounter Diagnoses  Name Primary?   Cognitive decline Yes   Alcohol use    Dysthymia    For hospital discharge follow-up status post acute on chronic heart failure with reduced ejection fraction.  EF was 20%.  At this point she is breathing well and denies shortness of breath or chest pain.  There is been no cough or fever.  She is sleeping on 2 pillows.  She cannot lay flat she tells me.  She is accompanied by her husband today.  Both he and her daughter are concerned about patient's cognition.  He says that she often argues about taking pills and need for follow-up.  Patient is aware that she is forgetful.  A visiting home health person showed up the other day and patient declined admission into their house.  Husband is concerned about her alcohol usage.  Her daughter ,Sabrina Hernandez  believes that both she and her husband may need to be in assisted living.   Review of Systems  Constitutional: Negative.   HENT: Negative.    Eyes:  Negative for blurred vision, discharge and redness.  Respiratory: Negative.    Cardiovascular: Negative.   Gastrointestinal:  Negative for abdominal pain.  Genitourinary: Negative.   Musculoskeletal: Negative.  Negative for myalgias.  Skin:  Negative for rash.  Neurological:  Negative for tingling, loss of consciousness and weakness.  Endo/Heme/Allergies:  Negative for polydipsia.  Psychiatric/Behavioral:  Positive for depression, memory loss and substance abuse. Negative for suicidal ideas. The patient is not nervous/anxious and does not have insomnia.        08/25/2022   11:16 AM 07/09/2018   10:39 AM  Depression screen PHQ 2/9  Decreased Interest 0 3  Down, Depressed, Hopeless 1 3  PHQ - 2 Score 1 6  Altered sleeping  2   Tired, decreased energy  3  Change in appetite  2  Feeling bad or failure about yourself   3  Trouble concentrating  2  Moving slowly or fidgety/restless  3  Suicidal thoughts  0  PHQ-9 Score  21     Current Outpatient Medications:    aspirin EC 81 MG tablet, Take 1 tablet (81 mg total) by mouth daily. Swallow whole., Disp: 30 tablet, Rfl: 0   carvedilol (COREG) 3.125 MG tablet, Take 1 tablet (3.125 mg total) by mouth 2 (two) times daily with a meal., Disp: 60 tablet, Rfl: 0   empagliflozin (JARDIANCE) 10 MG TABS tablet, Take 1 tablet (10 mg total) by mouth daily before breakfast., Disp: 30 tablet, Rfl: 3   furosemide (LASIX) 20 MG tablet, TAKE 1 TABLET(20 MG) BY MOUTH DAILY, Disp: 30 tablet, Rfl: 1   Multiple Vitamin (MULTIVITAMIN WITH MINERALS) TABS tablet, Take 1 tablet by mouth every morning., Disp: , Rfl:    potassium chloride (KLOR-CON M) 10 MEQ tablet, Take 1 tablet (10 mEq total) by mouth 3 (three) times a week. Monday/Wednesday/Friday, Disp: 30 tablet, Rfl: 1   sacubitril-valsartan (ENTRESTO) 24-26 MG, Take 1 tablet by mouth 2 (two) times daily., Disp: 60 tablet, Rfl: 1   sertraline (ZOLOFT) 25 MG tablet, Take 1 tablet (25 mg total) by mouth daily., Disp: 30 tablet, Rfl: 1   Objective:     BP 118/68 (  BP Location: Right Arm, Patient Position: Sitting, Cuff Size: Normal)   Pulse 65   Temp (!) 97.2 F (36.2 C) (Temporal)   Ht 5' (1.524 m)   Wt 105 lb 6.4 oz (47.8 kg)   SpO2 95%   BMI 20.58 kg/m    Physical Exam Constitutional:      General: She is not in acute distress.    Appearance: Normal appearance. She is not ill-appearing, toxic-appearing or diaphoretic.  HENT:     Head: Normocephalic and atraumatic.     Right Ear: External ear normal.     Left Ear: External ear normal.  Eyes:     General: No scleral icterus.       Right eye: No discharge.        Left eye: No discharge.     Extraocular Movements: Extraocular movements intact.     Conjunctiva/sclera:  Conjunctivae normal.  Cardiovascular:     Rate and Rhythm: Normal rate and regular rhythm.     Comments: ? Mid diastolic click? Pulmonary:     Effort: Pulmonary effort is normal. No respiratory distress.     Breath sounds: Normal breath sounds. No wheezing, rhonchi or rales.  Musculoskeletal:     Right lower leg: No edema.     Left lower leg: No edema.  Skin:    General: Skin is warm and dry.  Neurological:     Mental Status: She is alert and oriented to person, place, and time.  Psychiatric:        Mood and Affect: Mood normal.        Behavior: Behavior normal.      No results found for any visits on 08/25/22.    The ASCVD Risk score (Arnett DK, et al., 2019) failed to calculate for the following reasons:   The 2019 ASCVD risk score is only valid for ages 53 to 28    Assessment & Plan:   Cognitive decline -     Ambulatory referral to Neurology  Alcohol use  Dysthymia -     Sertraline HCl; Take 1 tablet (25 mg total) by mouth daily.  Dispense: 30 tablet; Refill: 1    Return in about 6 weeks (around 10/06/2022).  Advised them to follow-up with cardiology for blood work.  Advised him to be sure to keep the pulmonology appointment.  Referral to neurology for assessment of cognitive decline.  Advised her to stop drinking.  Advised him to allow access to visiting home health nurse.  Will start low-dose sertraline for dysthymia.  [name], MD

## 2022-08-26 ENCOUNTER — Encounter: Payer: Self-pay | Admitting: Emergency Medicine

## 2022-08-26 ENCOUNTER — Ambulatory Visit (INDEPENDENT_AMBULATORY_CARE_PROVIDER_SITE_OTHER): Payer: Medicare Other | Admitting: Emergency Medicine

## 2022-08-26 VITALS — BP 108/64 | HR 62 | Temp 97.3°F | Ht 60.0 in | Wt 106.6 lb

## 2022-08-26 DIAGNOSIS — R918 Other nonspecific abnormal finding of lung field: Secondary | ICD-10-CM

## 2022-08-26 DIAGNOSIS — R9389 Abnormal findings on diagnostic imaging of other specified body structures: Secondary | ICD-10-CM | POA: Diagnosis not present

## 2022-08-26 MED ORDER — AMOXICILLIN-POT CLAVULANATE 875-125 MG PO TABS: 1.0000 | ORAL_TABLET | Freq: Two times a day (BID) | ORAL | 0 refills | Status: DC

## 2022-08-26 NOTE — Patient Instructions (Signed)
Please take Augmentin as directed until completely gone (7 days) Continue your other medications as you have been taking them We will plan to repeat your CT scan of the chest in April 2024 to compare with your priors. Follow Dr. Lamonte Sakai in April after your CT scan so we can review the results together.

## 2022-08-26 NOTE — Assessment & Plan Note (Signed)
Poor historian.  She does not have much recollection regarding the symptoms she was experiencing when she was in the ED in mid January, briefly hospitalized in mid February.  She left AMA on most occasions.  She does recall that she was having some dyspnea and orthopnea.  Unclear to me whether she was effectively treated with antibiotics for what looks like a multifocal pneumonia.  Her most concerning CT abnormality is a thick walled cavitary lesion at the left apex.  I discussed the differential diagnosis with her today including possible multifocal pneumonia, noninfectious autoimmune/inflammatory process, malignancy.  I will plan to treat her with Augmentin for 1 week, repeat her CT chest at the 1-monthmark (from the original) to look for resolution of the nodule.  If it remains then we will need to talk about possible diagnostics.  Confounding this evaluation is her suspected evolving dementia.

## 2022-08-26 NOTE — Progress Notes (Signed)
Subjective:    Patient ID: Sabrina Hernandez, female    DOB: Jan 28, 1935, 88 y.o.   MRN: [id number]  HPI 88 year old woman with a history of former tobacco use, history of CAD and ischemic cardiomyopathy with associated CHF, medical noncompliance.  She has hypertension, reported COPD.  She was admitted with apparent acute exacerbation of her systolic CHF.  She underwent a CT chest at that time that showed suspected multifocal pneumonia as below.  She was started on Augmentin, also received diuretics.  Unfortunately she left AMA.  She is referred for evaluation of her status post pneumonia and also to follow-up her abnormal chest imaging.  QuantiFERON gold done 08/13/2022 was negative. She is unsure whether she received or completed abx.  She denies any cough. She was experiencing orthopnea, but this has improved. No aspiration sx. No CP.   CT-PA 08/12/2022 reviewed by me showed no evidence of pulmonary embolism, small left and moderate right pleural effusion, patchy airspace consolidation bilaterally.  There was a stable 1.6 cm left upper lobe cavitary lesion.  This was unchanged compared with a CT 1 month prior   Review of Systems As per HPI  Past Medical History:  Diagnosis Date   Diverticulitis    HFrEF (heart failure with reduced ejection fraction) ([facility]) 07/30/2021   Presumed nonischemic cardiomyopathy  Myoview 4/17 ([facility]): EF 28, no ischemia Intolerant of metoprolol/ACE inhibitor due to fatigue Echo 2017: EF 20-25 Echocardiogram 10/01/2020:  EF <20, normal RVSF, trivial pericardial effusion Echocardiogram 08/30/2020: EF <20, global HK, GR 1 DD, normal RVSF, mild MR, AV sclerosis without stenosis, small-moderate pericardial effusion      Family History  Problem Relation Age of Onset   Heart failure Mother    Early death Mother    Alcohol abuse Father    Mental illness Father    Stroke Father      Social History   Socioeconomic History   Marital status: Married    Spouse name:  Herbie Baltimore   Number of children: 5   Years of education: Not on file   Highest education level: Not on file  Occupational History   Occupation: Teacher/instructor-retired    Fish farm manager: [employer]  Tobacco Use   Smoking status: Former   Smokeless tobacco: Never  Scientific laboratory technician Use: Never used  Substance and Sexual Activity   Alcohol use: Yes    Comment: social    Drug use: No   Sexual activity: Not on file  Other Topics Concern   Not on file  Social History Narrative   She is from [place], [place] originally but has lived in [place] for 30 years. She would like to move back. She is married with 5 grown children (3 in [place] and 2 in [place]. She is a retired Psychologist, prison and probation services.   Social Determinants of Health   Financial Resource Strain: Not on file  Food Insecurity: No Food Insecurity (08/15/2022)   Hunger Vital Sign    Worried About Running Out of Food in the Last Year: Never true    Ran Out of Food in the Last Year: Never true  Transportation Needs: No Transportation Needs (08/13/2022)   PRAPARE - Hydrologist (Medical): No    Lack of Transportation (Non-Medical): No  Physical Activity: Not on file  Stress: Not on file  Social Connections: Not on file  Intimate Partner Violence: Not At Risk (08/13/2022)   Humiliation, Afraid, Rape, and Kick questionnaire  Fear of Current or Ex-Partner: No    Emotionally Abused: No    Physically Abused: No    Sexually Abused: No    She is from [place], [place], has lived there, [place] Psychologist, prison and probation services.   No Known Allergies   Outpatient Medications Prior to Visit  Medication Sig Dispense Refill   aspirin EC 81 MG tablet Take 1 tablet (81 mg total) by mouth daily. Swallow whole. 30 tablet 0   carvedilol (COREG) 3.125 MG tablet Take 1 tablet (3.125 mg total) by mouth 2 (two) times daily with a meal. 60 tablet 0   empagliflozin (JARDIANCE) 10 MG TABS tablet Take 1 tablet (10 mg total) by mouth daily before breakfast. 30  tablet 3   furosemide (LASIX) 20 MG tablet TAKE 1 TABLET(20 MG) BY MOUTH DAILY 30 tablet 1   Multiple Vitamin (MULTIVITAMIN WITH MINERALS) TABS tablet Take 1 tablet by mouth every morning.     potassium chloride (KLOR-CON M) 10 MEQ tablet Take 1 tablet (10 mEq total) by mouth 3 (three) times a week. Monday/Wednesday/Friday 30 tablet 1   sacubitril-valsartan (ENTRESTO) 24-26 MG Take 1 tablet by mouth 2 (two) times daily. 60 tablet 1   sertraline (ZOLOFT) 25 MG tablet Take 1 tablet (25 mg total) by mouth daily. 30 tablet 1   No facility-administered medications prior to visit.         Objective:   Physical Exam Vitals:   08/26/22 1024  BP: 108/64  Pulse: 62  Temp: (!) 97.3 F (36.3 C)  TempSrc: Oral  SpO2: 98%  Weight: 106 lb 9.6 oz (48.4 kg)  Height: 5' (1.524 m)   Gen: Pleasant, well-nourished, in no distress,  normal affect  ENT: No lesions,  mouth clear,  oropharynx clear, no postnasal drip  Neck: No JVD, no stridor  Lungs: No use of accessory muscles, no crackles or wheezing on normal respiration, no wheeze on forced expiration  Cardiovascular: RRR, heart sounds normal, no murmur or gallops, no peripheral edema  Musculoskeletal: No deformities, no cyanosis or clubbing  Neuro: alert, awake, answers questions appropriately, poor long-term memory.  Follows commands  Skin: Warm, no lesions or rash       Assessment & Plan:  Abnormal CT of the chest Poor historian.  She does not have much recollection regarding the symptoms she was experiencing when she was in the ED in mid January, briefly hospitalized in mid February.  She left AMA on most occasions.  She does recall that she was having some dyspnea and orthopnea.  Unclear to me whether she was effectively treated with antibiotics for what looks like a multifocal pneumonia.  Her most concerning CT abnormality is a thick walled cavitary lesion at the left apex.  I discussed the differential diagnosis with her today  including possible multifocal pneumonia, noninfectious autoimmune/inflammatory process, malignancy.  I will plan to treat her with Augmentin for 1 week, repeat her CT chest at the 4-monthmark (from the original) to look for resolution of the nodule.  If it remains then we will need to talk about possible diagnostics.  Confounding this evaluation is her suspected evolving dementia.   RBaltazar Apo MD, PhD 08/26/2022, 10:58 AM [facility] [phone] if no answer before 7:00PM call [phone] For any issues after 7:00PM please call eLink [phone]

## 2022-08-27 DIAGNOSIS — I110 Hypertensive heart disease with heart failure: Secondary | ICD-10-CM | POA: Diagnosis not present

## 2022-08-27 DIAGNOSIS — R911 Solitary pulmonary nodule: Secondary | ICD-10-CM | POA: Diagnosis not present

## 2022-08-27 DIAGNOSIS — F32A Depression, unspecified: Secondary | ICD-10-CM | POA: Diagnosis not present

## 2022-08-27 DIAGNOSIS — I5023 Acute on chronic systolic (congestive) heart failure: Secondary | ICD-10-CM | POA: Diagnosis not present

## 2022-08-27 DIAGNOSIS — I429 Cardiomyopathy, unspecified: Secondary | ICD-10-CM | POA: Diagnosis not present

## 2022-08-27 DIAGNOSIS — J45909 Unspecified asthma, uncomplicated: Secondary | ICD-10-CM | POA: Diagnosis not present

## 2022-08-29 DIAGNOSIS — I110 Hypertensive heart disease with heart failure: Secondary | ICD-10-CM | POA: Diagnosis not present

## 2022-08-29 DIAGNOSIS — F32A Depression, unspecified: Secondary | ICD-10-CM | POA: Diagnosis not present

## 2022-08-29 DIAGNOSIS — R911 Solitary pulmonary nodule: Secondary | ICD-10-CM | POA: Diagnosis not present

## 2022-08-29 DIAGNOSIS — I429 Cardiomyopathy, unspecified: Secondary | ICD-10-CM | POA: Diagnosis not present

## 2022-08-29 DIAGNOSIS — J45909 Unspecified asthma, uncomplicated: Secondary | ICD-10-CM | POA: Diagnosis not present

## 2022-08-29 DIAGNOSIS — I5023 Acute on chronic systolic (congestive) heart failure: Secondary | ICD-10-CM | POA: Diagnosis not present

## 2022-09-01 ENCOUNTER — Telehealth: Payer: Self-pay | Admitting: Family Medicine

## 2022-09-01 NOTE — Telephone Encounter (Signed)
Pt came in with her husband who had an appointment on 09/01/22. She is having concerns over two prescriptions potassium chloride (KLOR-CON M) 10 MEQ tablet [id number] and furosemide (LASIX) 20 MG tablet [id number]. I let her know Dr Ethelene Hal did not prescribe this scripts. She still wants a cb. Please advise at   [phone] [facility])

## 2022-09-01 NOTE — Telephone Encounter (Signed)
Returned patients call, while speaking with patient phone hung up tried calling back no answer. Will try again

## 2022-09-03 NOTE — Telephone Encounter (Signed)
Spoke with patient who states that she no longer has questions about medication.

## 2022-09-05 NOTE — Telephone Encounter (Signed)
[place] Name: [place] Name: Sabrina Hernandez Phone #: [phone] secure VM  Service Requested: called to notify that pt declined visit for the 2nd time this week. Heather normally helps pt refill pill boxes. Pt told her she isn't going to take the meds anymore.  Concern that pt is still drinking alcohol and depressed from son's passing.  Called pts daughter to notify but she did not get a call back. Called pts husband to notify but he did not call back. Nira Conn will attempt another f/u visit with pt next week.

## 2022-09-17 NOTE — Progress Notes (Addendum)
Office Visit    Patient Name: Sabrina Hernandez Date of Encounter: 09/18/2022  Primary Care Provider:  [name], MD Primary Cardiologist:  [name], MD Primary Electrophysiologist: None  Chief Complaint    Sabrina Hernandez is a 88 y.o. female with PMH of HFrEF, GERD, HTN, LBBB who presents today for acute on chronic CHF and follow-up of     Past Medical History    Past Medical History:  Diagnosis Date   Diverticulitis    HFrEF (heart failure with reduced ejection fraction) ([facility]) 07/30/2021   Presumed nonischemic cardiomyopathy  Myoview 4/17 ([facility]): EF 28, no ischemia Intolerant of metoprolol/ACE inhibitor due to fatigue Echo 2017: EF 20-25 Echocardiogram 10/01/2020:  EF <20, normal RVSF, trivial pericardial effusion Echocardiogram 08/30/2020: EF <20, global HK, GR 1 DD, normal RVSF, mild MR, AV sclerosis without stenosis, small-moderate pericardial effusion    Past Surgical History:  Procedure Laterality Date   CERVICAL CONE BIOPSY     HAMMER TOE SURGERY     right foot    ORIF PATELLA Left 01/29/2015   Procedure: OPEN REDUCTION INTERNAL (ORIF) FIXATION LEFT PATELLA;  Surgeon: Paralee Cancel, MD;  Location: [facility];  Service: Orthopedics;  Laterality: Left;    Allergies  No Known Allergies  History of Present Illness    Sabrina Hernandez  is a 88 year old female with the above mention past medical history who presents today for acute on chronic CHF following recent [facility] ED visit. She was initially diagnosed with HFrEF in 2017 when she presented with worsening SOB. Was seen at [facility]. Declined cath at that time but myoview was negative for ischemia. .  Sabrina Hernandez was initially seen by Dr. Johney Frame 08/09/2020 following referral by PCP for management of HFrEF.  She had complained of DOE over previous few months and was seen in the ED 08/02/2020 and found to have exacerbation of CHF with BNP of 1145 and troponins trending 49-53.  She was diuresed with IV Lasix  with good response and discharged with plans to follow-up with cardiology.  2D echo was completed and showed EF of less than 20% with decreased LV function and global hypokinesis with grade 1 DD and small pericardial effusion.  Repeat echo completed 09/2020 to evaluate progression of pericardial effusion that showed improvement with same EF.  Started on Lasix 20 mg twice daily and patient was started on SGLT2 with further optimization of GDMT with spironolactone and Entresto.  She was seen in follow-up 07/2021 by [name], PA and patient noted that she had discontinued her Entresto, metoprolol, spironolactone.  She was noted to be up 3 pounds on her weight.  She was noted to have elevated heart rate and was encouraged to restart GDMT and was willing to start bisoprolol 2.5 mg.  She was seen by Dr. Johney Frame 09/20/2021 and patient was reportedly doing well but reported difficulty controlling her bowels.  She is stopped her medication because she ran out.  She declined any invasive workup at that time and patient was started on Lasix 20 mg twice daily and trial of Entresto with Pharm.D. consultation placed.  She was seen by Pharm.D./7/23 and was tolerating Entresto and was placed on spironolactone.  She was seen by [name], PA on 10/30/2021 and reported doing well overall.  She noted some brief episodes of sharp chest pain on the right but no significant shortness of breath.  She declined any invasive testing for chest discomfort and left for candidate for  4 months.  She was seen in the ED on 07/17/2022 with complaint of shortness of breath.  Her BNP was noted to be 1500 and troponins were mildly elevated.  Chest x-ray was shows signs of pulmonary congestion and.  She underwent a cardiac CTA to rule out PE that showed bilateral pleural effusions and no evidence of PE.  She received 20 mg of Lasix with diuresis.  She was noted to be tachycardia and tachypneic with ambulation.  She was recommended for inpatient  workup however patient left AMA prior to being transported to [facility] for admission.  She was seen in follow-up on 07/22/2022 and was started back on her GDMT with Entresto, Lasix, and potassium supplement.  Patient however contacted office 4 days later and had stopped all medications due to diarrhea.  Advised patient to follow-up in the ED for further evaluation.  She presented to the ED 08/13/2022 with complaint of shortness of breath orthopnea and chest discomfort.  2D echo was completed showing EF of less than 20%.  Patient was evaluated by heart failure Pharm.D. and refused to schedule TOC visit with heart failure clinic patient's BMP was 2514 and troponin was 85 with chest x-ray revealing pulmonary vascular congestion and small bilateral effusions.  CT of the chest showed patchy areas of airspace density.  Patient was restarted on Entresto, Coreg and low-dose Lasix.   Sabrina Hernandez presents today for 1 month follow-up alone.  Since last being seen in the office patient reports that she has been doing well with no complaints of shortness of breath.  She is compliant with her current medications and has continued her regimen without any discontinuation.  Her blood pressure today is 110/56 and heart rate was 60 bpm.  She is euvolemic on exam and has not experienced any shortness of breath or dizziness since previous visit.  Patient denies chest pain, palpitations, dyspnea, PND, orthopnea, nausea, vomiting, dizziness, syncope, edema, weight gain, or early satiety.   Home Medications    Current Outpatient Medications  Medication Sig Dispense Refill   aspirin EC 81 MG tablet Take 1 tablet (81 mg total) by mouth daily. Swallow whole. 30 tablet 0   empagliflozin (JARDIANCE) 10 MG TABS tablet Take 1 tablet (10 mg total) by mouth daily before breakfast. 30 tablet 3   furosemide (LASIX) 20 MG tablet TAKE 1 TABLET(20 MG) BY MOUTH DAILY 30 tablet 1   Multiple Vitamin (MULTIVITAMIN WITH MINERALS) TABS tablet Take  1 tablet by mouth every morning.     potassium chloride (KLOR-CON M) 10 MEQ tablet Take 1 tablet (10 mEq total) by mouth 3 (three) times a week. Monday/Wednesday/Friday 30 tablet 1   sacubitril-valsartan (ENTRESTO) 24-26 MG Take 1 tablet by mouth 2 (two) times daily. 60 tablet 1   sertraline (ZOLOFT) 25 MG tablet Take 1 tablet (25 mg total) by mouth daily. 30 tablet 1   amoxicillin-clavulanate (AUGMENTIN) 875-125 MG tablet Take 1 tablet by mouth 2 (two) times daily. (Patient not taking: Reported on 09/18/2022) 14 tablet 0   carvedilol (COREG) 3.125 MG tablet Take 1 tablet (3.125 mg total) by mouth 2 (two) times daily with a meal. (Patient not taking: Reported on 09/18/2022) 60 tablet 0   No current facility-administered medications for this visit.     Review of Systems  Please see the history of present illness.    All other systems reviewed and are otherwise negative except as noted above.  Physical Exam    Wt Readings from Last 3 Encounters:  09/18/22 105 lb (47.6 kg)  08/26/22 106 lb 9.6 oz (48.4 kg)  08/25/22 105 lb 6.4 oz (47.8 kg)   VS: Vitals:   09/18/22 1358  BP: (!) 110/56  Pulse: 60  SpO2: 98%  ,Body mass index is 20.51 kg/m.  Constitutional:      Appearance: Healthy appearance. Not in distress.  Neck:     Vascular: JVD normal.  Pulmonary:     Effort: Pulmonary effort is normal.     Breath sounds: No wheezing. No rales. Diminished in the bases Cardiovascular:     Normal rate. Regular rhythm. Normal S1. Normal S2.      Murmurs: There is no murmur.  Edema:    Peripheral edema absent.  Abdominal:     Palpations: Abdomen is soft non tender. There is no hepatomegaly.  Skin:    General: Skin is warm and dry.  Neurological:     General: No focal deficit present.     Mental Status: Alert and oriented to person, place and time.     Cranial Nerves: Cranial nerves are intact.  EKG/LABS/ Recent Cardiac Studies    ECG personally reviewed by me today -none completed  today   Lab Results  Component Value Date   WBC 4.6 08/13/2022   HGB 13.7 08/13/2022   HCT 41.4 08/13/2022   MCV 90.2 08/13/2022   PLT 206 08/13/2022   Lab Results  Component Value Date   CREATININE 1.43 (H) 08/14/2022   BUN 22 08/14/2022   NA 136 08/14/2022   K 3.4 (L) 08/14/2022   CL 97 (L) 08/14/2022   CO2 27 08/14/2022   Lab Results  Component Value Date   ALT 43 08/12/2022   AST 55 (H) 08/12/2022   ALKPHOS 91 08/12/2022   BILITOT 1.4 (H) 08/12/2022   Lab Results  Component Value Date   CHOL 169 08/14/2022   HDL 66 08/14/2022   LDLCALC 92 08/14/2022   TRIG 53 08/14/2022   CHOLHDL 2.6 08/14/2022    Lab Results  Component Value Date   HGBA1C 5.0 08/12/2022    Cardiac Studies & Procedures       ECHOCARDIOGRAM  ECHOCARDIOGRAM LIMITED 10/01/2020  Narrative ECHOCARDIOGRAM REPORT    Patient Name:   Sabrina Hernandez Date of Exam: 10/01/2020 Medical Rec #:  [id number]        Height:       60.5 in Accession #:    [id number]       Weight:       104.0 lb Date of Birth:  05/31/1935         BSA:          1.422 m Patient Age:    44 years         BP:           110/60 mmHg Patient Gender: F                HR:           42 bpm. Exam Location:  [address]  Procedure: Limited Echo, Cardiac Doppler and Limited Color Doppler  Indications:    I31.3 Pericardial effusion (noninflammatory)  History:        Patient has prior history of Echocardiogram examinations, most recent 08/30/2020. CHF; Risk Factors:Hypertension. Right leg edema.  Sonographer:    Diamond Nickel RCS Referring Phys: [place] Comments: Per consult with Dr. Angelena Form (DOD), a limited study without Definity was performed. IMPRESSIONS   1.  Limited exam to evaluate pericardial effusion. 2. Left ventricular ejection fraction, by estimation, is <20%. The left ventricle has severely decreased function. 3. Right ventricular systolic function is normal. The right ventricular  size is normal. 4. Compared to previous echo on August 30, 2020, the pericardial effusion is smaller. 5 mm maximal dimension (subcostal view).  FINDINGS Left Ventricle: Left ventricular ejection fraction, by estimation, is <20%. The left ventricle has severely decreased function.  Right Ventricle: The right ventricular size is normal. Right ventricular systolic function is normal.  Pericardium: Compared to previous echo on August 30, 2020, the pericardial effusion is smaller. 5 mm maximal dimension (subcostal view). Trivial pericardial effusion is present.   LEFT VENTRICLE PLAX 2D LVIDd:         5.30 cm LVIDs:         4.70 cm LV PW:         1.30 cm LV IVS:        1.10 cm   AORTIC VALVE LVOT Vmax:   65.30 cm/s LVOT Vmean:  40.500 cm/s LVOT VTI:    0.151 m   SHUNTS Systemic VTI: 0.15 m  Dietrich Pates MD Electronically signed by Dietrich Pates MD Signature Date/Time: 10/01/2020/11:58:10 AM    Final             Assessment & Plan    1.  HFrEF: -Patient recently admitted with shortness of breath and found to have elevated -Today patient is euvolemic on examination and has been compliant with her current medication regimen. -She was advised to continue GDMT with Lasix 20 mg daily, Entresto 24/26 mg daily, Jardiance 10 mg -We will add spironolactone 12.5 mg daily -We will discontinue potassium -Low sodium diet, fluid restriction <2L, and daily weights encouraged. Educated to contact our office for weight gain of 2 lbs overnight or 5 lbs in one week.    2.  Essential hypertension: -Patient's blood pressure today was well-controlled at 110/56 -Carvedilol was discontinued due to decreased blood pressures.  3.  Shortness of breath: She reports today that her breathing has been improved and she is able to walk around her neighborhood without any shortness of breath. -Continue Lasix 20 mg   4.  Aortic atherosclerosis: -Patient underwent a recent CT of the chest to rule out PE that  revealed atherosclerosis. -Patient declined further coronary workup at this time.   5.  EtOH abuse: -Patient was encouraged to decrease and moderate her alcohol intake and lieu of her decreased LV function and current medication regimen.  Disposition: Follow-up with Meriam Sprague, MD or APP in 2 months   Medication Adjustments/Labs and Tests Ordered: Current medicines are reviewed at length with the patient today.  Concerns regarding medicines are outlined above.   Signed, Napoleon Form, Leodis Rains, NP 09/18/2022, 2:25 PM [facility]  Note:  This document was prepared using Dragon voice recognition software and may include unintentional dictation errors.

## 2022-09-18 ENCOUNTER — Ambulatory Visit: Payer: Medicare Other | Attending: Nurse Practitioner | Admitting: Nurse Practitioner

## 2022-09-18 ENCOUNTER — Encounter: Payer: Self-pay | Admitting: Nurse Practitioner

## 2022-09-18 VITALS — BP 110/56 | HR 60 | Ht 60.0 in | Wt 105.0 lb

## 2022-09-18 DIAGNOSIS — I10 Essential (primary) hypertension: Secondary | ICD-10-CM | POA: Diagnosis not present

## 2022-09-18 DIAGNOSIS — I5020 Unspecified systolic (congestive) heart failure: Secondary | ICD-10-CM | POA: Insufficient documentation

## 2022-09-18 DIAGNOSIS — I700 Atherosclerosis of aorta: Secondary | ICD-10-CM | POA: Diagnosis not present

## 2022-09-18 DIAGNOSIS — Z789 Other specified health status: Secondary | ICD-10-CM | POA: Diagnosis not present

## 2022-09-18 MED ORDER — SPIRONOLACTONE 25 MG PO TABS: 12.5000 mg | ORAL_TABLET | Freq: Every day | ORAL | 2 refills | Status: DC

## 2022-09-18 NOTE — Patient Instructions (Addendum)
Medication Instructions:  STOP Potassium  START Spironolactone 12.5mg  take 1 tablet once a day  *If you need a refill on your cardiac medications before your next appointment, please call your pharmacy*   Lab Work: TODAY-BMET & MAG ([place]) 2 WEEK BMET If you have labs (blood work) drawn today and your tests are completely normal, you will receive your results only by: [place] (if you have MyChart) OR A paper copy in the mail If you have any lab test that is abnormal or we need to change your treatment, we will call you to review the results.   Testing/Procedures: Your physician has requested that you have an echocardiogram. Echocardiography is a painless test that uses sound waves to create images of your heart. It provides your doctor with information about the size and shape of your heart and how well your heart's chambers and valves are working. This procedure takes approximately one hour. There are no restrictions for this procedure. Please do NOT wear cologne, perfume, aftershave, or lotions (deodorant is allowed). Please arrive 15 minutes prior to your appointment time. NEED TO HAVE COMPLETED BEFORE NEXT APPOINTMENT  Follow-Up: At [facility], you and your health needs are our priority.  As part of our continuing mission to provide you with exceptional heart care, we have created designated Provider Care Teams.  These Care Teams include your primary Cardiologist (physician) and Advanced Practice Providers (APPs -  Physician Assistants and Nurse Practitioners) who all work together to provide you with the care you need, when you need it.  We recommend signing up for the patient portal called "MyChart".  Sign up information is provided on this After Visit Summary.  MyChart is used to connect with patients for Virtual Visits (Telemedicine).  Patients are able to view lab/test results, encounter notes, upcoming appointments, etc.  Non-urgent messages can be sent  to your provider as well.   To learn more about what you can do with MyChart, go to [email or web address].    Your next appointment:   2 month(s)  Provider:   [name], MD  or Ambrose Pancoast, NP   Other Instructions

## 2022-09-19 LAB — BASIC METABOLIC PANEL
BUN/Creatinine Ratio: 20 (ref 12–28)
BUN: 23 mg/dL (ref 8–27)
CO2: 26 mmol/L (ref 20–29)
Calcium: 9.5 mg/dL (ref 8.7–10.3)
Chloride: 102 mmol/L (ref 96–106)
Creatinine, Ser: 1.13 mg/dL — ABNORMAL HIGH (ref 0.57–1.00)
Glucose: 79 mg/dL (ref 70–99)
Potassium: 4.1 mmol/L (ref 3.5–5.2)
Sodium: 143 mmol/L (ref 134–144)
eGFR: 48 mL/min/{1.73_m2} — ABNORMAL LOW (ref >59)

## 2022-09-19 LAB — MAGNESIUM: Magnesium: 2.3 mg/dL (ref 1.6–2.3)

## 2022-10-02 ENCOUNTER — Ambulatory Visit: Payer: Medicare Other | Attending: Nurse Practitioner

## 2022-10-02 DIAGNOSIS — Z789 Other specified health status: Secondary | ICD-10-CM

## 2022-10-02 DIAGNOSIS — I700 Atherosclerosis of aorta: Secondary | ICD-10-CM

## 2022-10-02 DIAGNOSIS — I10 Essential (primary) hypertension: Secondary | ICD-10-CM | POA: Diagnosis not present

## 2022-10-02 DIAGNOSIS — I5020 Unspecified systolic (congestive) heart failure: Secondary | ICD-10-CM

## 2022-10-03 LAB — BASIC METABOLIC PANEL
BUN/Creatinine Ratio: 13 (ref 12–28)
BUN: 12 mg/dL (ref 8–27)
CO2: 23 mmol/L (ref 20–29)
Calcium: 9.6 mg/dL (ref 8.7–10.3)
Chloride: 105 mmol/L (ref 96–106)
Creatinine, Ser: 0.96 mg/dL (ref 0.57–1.00)
Glucose: 68 mg/dL — ABNORMAL LOW (ref 70–99)
Potassium: 4.7 mmol/L (ref 3.5–5.2)
Sodium: 142 mmol/L (ref 134–144)
eGFR: 58 mL/min/{1.73_m2} — ABNORMAL LOW (ref >59)

## 2022-10-06 ENCOUNTER — Ambulatory Visit: Payer: Medicare Other | Admitting: Family Medicine

## 2022-10-06 ENCOUNTER — Telehealth: Payer: Self-pay | Admitting: Family Medicine

## 2022-10-06 NOTE — Telephone Encounter (Signed)
4.8.24 no show letter sent 

## 2022-10-06 NOTE — Telephone Encounter (Signed)
1st no show, fee waived, letter sent to reschedule/notify of policy 

## 2022-10-07 ENCOUNTER — Telehealth: Payer: Self-pay

## 2022-10-07 NOTE — Telephone Encounter (Deleted)
Reviewed results with patient who verbalized understanding and thanked me for the call 

## 2022-10-07 NOTE — Telephone Encounter (Addendum)
Reviewed results with patient who verbalized understanding and thanked me for the call.   ----- Message from Gaston Islam., NP sent at 10/03/2022  7:19 AM EDT ----- Please let Ms. Blicharz know that her potassium is normal and renal function has improved.  Please continue your current medications as prescribed and make sure to weigh yourself daily and abstain from excess salt.  Please let us know if you have any additional questions.  Robin Searing, NP

## 2022-10-13 ENCOUNTER — Ambulatory Visit (HOSPITAL_BASED_OUTPATIENT_CLINIC_OR_DEPARTMENT_OTHER)
Admission: RE | Admit: 2022-10-13 | Discharge: 2022-10-13 | Disposition: A | Discharge status: Home / Self Care | Payer: Medicare Other | Source: Ambulatory Visit | Attending: Emergency Medicine | Admitting: Emergency Medicine

## 2022-10-13 DIAGNOSIS — R918 Other nonspecific abnormal finding of lung field: Secondary | ICD-10-CM | POA: Diagnosis not present

## 2022-10-13 DIAGNOSIS — I700 Atherosclerosis of aorta: Secondary | ICD-10-CM | POA: Diagnosis not present

## 2022-10-14 ENCOUNTER — Ambulatory Visit: Payer: Medicare Other | Admitting: Emergency Medicine

## 2022-10-15 ENCOUNTER — Ambulatory Visit: Payer: Medicare Other | Admitting: Emergency Medicine

## 2022-10-17 ENCOUNTER — Ambulatory Visit (HOSPITAL_COMMUNITY): Payer: Medicare Other

## 2022-10-23 ENCOUNTER — Telehealth: Payer: Self-pay | Admitting: Family Medicine

## 2022-10-23 NOTE — Telephone Encounter (Signed)
Called patient to schedule Medicare Annual Wellness Visit (AWV). Left message for patient to call back and schedule Medicare Annual Wellness Visit (AWV).  Last date of AWV:  awvi 06/30/09 per [facility]   Please schedule an appointment at any time with [facility].  If any questions, please contact me at [phone].  Thank you ,  Lenna Hagarty [facility] AWV direct phone # [phone] 

## 2022-10-27 ENCOUNTER — Ambulatory Visit (HOSPITAL_COMMUNITY): Payer: Medicare Other | Attending: Nurse Practitioner

## 2022-10-27 DIAGNOSIS — I10 Essential (primary) hypertension: Secondary | ICD-10-CM | POA: Diagnosis not present

## 2022-10-27 DIAGNOSIS — I5020 Unspecified systolic (congestive) heart failure: Secondary | ICD-10-CM

## 2022-10-27 DIAGNOSIS — Z789 Other specified health status: Secondary | ICD-10-CM | POA: Diagnosis not present

## 2022-10-27 DIAGNOSIS — I700 Atherosclerosis of aorta: Secondary | ICD-10-CM

## 2022-10-27 LAB — ECHOCARDIOGRAM COMPLETE
Area-P 1/2: 6.17 cm2
MV M vel: 4.62 m/s
MV Peak grad: 85.4 mmHg
S' Lateral: 5.4 cm

## 2022-10-29 ENCOUNTER — Ambulatory Visit (INDEPENDENT_AMBULATORY_CARE_PROVIDER_SITE_OTHER): Payer: Medicare Other | Admitting: Family Medicine

## 2022-10-29 ENCOUNTER — Encounter: Payer: Self-pay | Admitting: Family Medicine

## 2022-10-29 VITALS — BP 112/70 | HR 85 | Temp 97.2°F | Ht 60.0 in | Wt 103.4 lb

## 2022-10-29 DIAGNOSIS — Z789 Other specified health status: Secondary | ICD-10-CM

## 2022-10-29 DIAGNOSIS — F4321 Adjustment disorder with depressed mood: Secondary | ICD-10-CM

## 2022-10-29 DIAGNOSIS — F341 Dysthymic disorder: Secondary | ICD-10-CM

## 2022-10-29 DIAGNOSIS — R4189 Other symptoms and signs involving cognitive functions and awareness: Secondary | ICD-10-CM | POA: Diagnosis not present

## 2022-10-29 NOTE — Progress Notes (Addendum)
Established Patient Office Visit   Subjective:  Patient ID: Sabrina Hernandez, female    DOB: 1935/06/25  Age: 88 y.o. MRN: [id number]  Chief Complaint  Patient presents with   Follow-up    HPI Encounter Diagnoses  Name Primary?   Cognitive decline Yes   Alcohol use    Dysthymia    Grieving    For follow-up of above.  Seen her husband yesterday and expressed concern about her.  She was seen back on February 26 and started on sertraline.  She was referred to neurology for evaluation of cognitive decline.  She was scheduled to return on March 8 and was a no-show.  She does not recall ever starting the sertraline.  She does not recall whether or not she ever had an appointment scheduled with neurology.  She is angry at her husband.  They fight all of the time.  Of course she continues to grieve her son's death this past 2023-04-21.  Her husband,daughter and visiting home health have been concerned about her medication compliance.  There has been concern about her alcohol usage.  She reports no more than 1 or 2 drinks daily.  She drove herself to the appointment today and is alone.   Review of Systems  Constitutional: Negative.   HENT: Negative.    Eyes:  Negative for blurred vision, discharge and redness.  Respiratory: Negative.    Cardiovascular: Negative.   Gastrointestinal:  Negative for abdominal pain.  Genitourinary: Negative.   Musculoskeletal: Negative.  Negative for myalgias.  Skin:  Negative for rash.  Neurological:  Negative for tingling, loss of consciousness and weakness.  Endo/Heme/Allergies:  Negative for polydipsia.  Psychiatric/Behavioral:  Positive for depression and memory loss. The patient is nervous/anxious.       10/29/2022    8:07 AM 08/25/2022   11:16 AM 07/09/2018   10:39 AM  Depression screen PHQ 2/9  Decreased Interest 0 0 3  Down, Depressed, Hopeless 3 1 3   PHQ - 2 Score 3 1 6   Altered sleeping   2  Tired, decreased energy   3  Change in appetite   2   Feeling bad or failure about yourself    3  Trouble concentrating   2  Moving slowly or fidgety/restless   3  Suicidal thoughts   0  PHQ-9 Score   21       Current Outpatient Medications:    furosemide (LASIX) 20 MG tablet, TAKE 1 TABLET(20 MG) BY MOUTH DAILY, Disp: 30 tablet, Rfl: 1   Multiple Vitamin (MULTIVITAMIN WITH MINERALS) TABS tablet, Take 1 tablet by mouth every morning., Disp: , Rfl:    sacubitril-valsartan (ENTRESTO) 24-26 MG, Take 1 tablet by mouth 2 (two) times daily., Disp: 60 tablet, Rfl: 1   amoxicillin-clavulanate (AUGMENTIN) 875-125 MG tablet, Take 1 tablet by mouth 2 (two) times daily. (Patient not taking: Reported on 09/18/2022), Disp: 14 tablet, Rfl: 0   aspirin EC 81 MG tablet, Take 1 tablet (81 mg total) by mouth daily. Swallow whole. (Patient not taking: Reported on 10/29/2022), Disp: 30 tablet, Rfl: 0   carvedilol (COREG) 3.125 MG tablet, Take 1 tablet (3.125 mg total) by mouth 2 (two) times daily with a meal. (Patient not taking: Reported on 09/18/2022), Disp: 60 tablet, Rfl: 0   empagliflozin (JARDIANCE) 10 MG TABS tablet, Take 1 tablet (10 mg total) by mouth daily before breakfast., Disp: 30 tablet, Rfl: 3   sertraline (ZOLOFT) 25 MG tablet, Take 1 tablet (25 mg total) by  mouth daily. (Patient not taking: Reported on 10/29/2022), Disp: 30 tablet, Rfl: 1   spironolactone (ALDACTONE) 25 MG tablet, Take 0.5 tablets (12.5 mg total) by mouth daily. (Patient not taking: Reported on 10/29/2022), Disp: 30 tablet, Rfl: 2   Objective:     BP 112/70   Pulse 85   Temp (!) 97.2 F (36.2 C) (Temporal)   Ht 5' (1.524 m)   Wt 103 lb 6.4 oz (46.9 kg)   SpO2 100%   BMI 20.19 kg/m    Physical Exam Constitutional:      General: She is not in acute distress.    Appearance: Normal appearance. She is not ill-appearing, toxic-appearing or diaphoretic.  HENT:     Head: Normocephalic and atraumatic.     Right Ear: External ear normal.     Left Ear: External ear normal.  Eyes:      General: No scleral icterus.       Right eye: No discharge.        Left eye: No discharge.     Extraocular Movements: Extraocular movements intact.     Conjunctiva/sclera: Conjunctivae normal.  Pulmonary:     Effort: Pulmonary effort is normal. No respiratory distress.  Skin:    General: Skin is warm and dry.  Neurological:     Mental Status: She is alert and oriented to person, place, and time.  Psychiatric:        Mood and Affect: Mood normal.        Behavior: Behavior normal.      No results found for any visits on 10/29/22.    The ASCVD Risk score (Arnett DK, et al., 2019) failed to calculate for the following reasons:   The 2019 ASCVD risk score is only valid for ages 67 to 2    Assessment & Plan:   Cognitive decline -     Ambulatory referral to Neurology  Alcohol use -     Ambulatory referral to Psychology -     Ethyl Glucuronide, Urine  Dysthymia -     Ambulatory referral to Psychology  Grieving -     Ambulatory referral to Psychology    Return in about 8 weeks (around 12/24/2022).   Sabrina Hernandez [phone]. Sabrina Sax, MD  5/3 addendum: Philbert Riser and voicemail picked up on the first ring.    5/3 addendum: Spoke with Leeann who is patient's daughter.  We discussed her parents situation, especially her mother's situation.  We discussed that her mom is probably drinking too much and may not be taking her medications.  We both are concerned about cognitive decline.  She may be rapidly losing her ability to live independently.

## 2022-10-31 ENCOUNTER — Telehealth: Payer: Self-pay | Admitting: Family Medicine

## 2022-10-31 NOTE — Telephone Encounter (Signed)
Spoke with patients daughter who states that she missed a call from Dr. Doreene Burke she was returning the message. Would like a call back.

## 2022-10-31 NOTE — Telephone Encounter (Signed)
Pt's daughter, Geoffery Spruce called in stating someone called her mom. She has no idea what this could be concerning. Leann at [phone].

## 2022-11-04 NOTE — Addendum Note (Signed)
Addended by: Andrez Grime on: 11/04/2022 09:54 AM   Modules accepted: Orders

## 2022-11-17 NOTE — Progress Notes (Unsigned)
Cardiology Office Note:    Date:  11/19/2022   ID:  Sabrina Hernandez, DOB 09/27/1934, MRN [id number]  PCP:  Mliss Sax, MD   [facility]  Cardiologist:  Meriam Sprague, MD  Advanced Practice Provider:  No care team member to display Electrophysiologist:  None    Referring MD: Mliss Sax,*     History of Present Illness:    Sabrina Hernandez is a 88 y.o. female with a hx of HTN, GERD and systolic heart failure who presents to clinic for follow-up of her HFrEF.   She was initially diagnosed with HFrEF in 2017 when she presented with worsening SOB. Was seen at [facility]. Declined cath at that time but myoview was negative for ischemia. She was initially on metop/ACE but these were later stopped due to fatigue. She has not followed regularly with a cardiologist. Last TTE was in 2017. No ICD in place.    Was seen in [facility] ED on 08/02/20 with worsening DOE over the past couple of months as well as orthopnea. In the ED, ECG with NSR, LAE, LBBB. LVH. TTE with LVEF 20-25% in 2017. BNP 1145. Trop 49>>53. COVID negative. She was diuresed with lasix IV with good response. She was discharged home with plans to follow-up with Cardiology for further management.   During our visit 08/09/20 the patient was doing better but continued to have SOB and orthopnea. BNP 4251. We resumed diuresis with lasix 20mg  BID.   During our visit on 08/24/20, the patient reported having black stools. On ASA but not AC. We stopped her ASA and referred to GI. CBC with HgB 15.1.  TTE on 08/29/20 with LVEF <20% with global hypokinesis. G1DD, mild MR, small to moderate pericardial effusion. Patient has adamently declined any invasive testing.  Had recurrent ER visit on 06/2021 with decompensated HF in the setting of not taking her lasix. BNP 1277 and CXR with pulmonary edema. She was given IV lasix and recommended for admission but she declined and wanted to go  home.  Re-presented to ER in 06/2022 with volume overload and chest pain. CTA negative for PE. Recommended admission but she left AMA.   Returned to ER in 07/2022 with SOB and chest pain. EF 20%. Again, meds were restarted as she had stopped taking them numerous times.  Was last seen in clinic on 08/2022 by Robin Searing. Was doing well at that time.   Today, the patient overall feels okay. No chest pain, SOB, orthopnea, PND or LE edema. Has run out of her coreg and jardiance but has been taking her lasix, entresto and spironolactone. No dizziness, lightheadedness or syncope.   Past Medical History:  Diagnosis Date   Diverticulitis    HFrEF (heart failure with reduced ejection fraction) (HCC) 07/30/2021   Presumed nonischemic cardiomyopathy  Myoview 4/17 [facility]): EF 28, no ischemia Intolerant of metoprolol/ACE inhibitor due to fatigue Echo 2017: EF 20-25 Echocardiogram 10/01/2020:  EF <20, normal RVSF, trivial pericardial effusion Echocardiogram 08/30/2020: EF <20, global HK, GR 1 DD, normal RVSF, mild MR, AV sclerosis without stenosis, small-moderate pericardial effusion     Past Surgical History:  Procedure Laterality Date   CERVICAL CONE BIOPSY     HAMMER TOE SURGERY     right foot    ORIF PATELLA Left 01/29/2015   Procedure: OPEN REDUCTION INTERNAL (ORIF) FIXATION LEFT PATELLA;  Surgeon: Durene Romans, MD;  Location: [facility];  Service: Orthopedics;  Laterality: Left;    Current Medications:  Current Meds  Medication Sig   Multiple Vitamin (MULTIVITAMIN WITH MINERALS) TABS tablet Take 1 tablet by mouth every morning.   sertraline (ZOLOFT) 25 MG tablet Take 1 tablet (25 mg total) by mouth daily.   [DISCONTINUED] furosemide (LASIX) 20 MG tablet TAKE 1 TABLET(20 MG) BY MOUTH DAILY   [DISCONTINUED] sacubitril-valsartan (ENTRESTO) 24-26 MG Take 1 tablet by mouth 2 (two) times daily.   [DISCONTINUED] spironolactone (ALDACTONE) 25 MG tablet Take 0.5 tablets (12.5 mg total) by mouth daily.      Allergies:   Patient has no known allergies.   Social History   Socioeconomic History   Marital status: Married    Spouse name: Molly Maduro   Number of children: 5   Years of education: Not on file   Highest education level: Not on file  Occupational History   Occupation: Teacher/instructor-retired    Associate Professor: [employer]  Tobacco Use   Smoking status: Former   Smokeless tobacco: Never  Building services engineer Use: Never used  Substance and Sexual Activity   Alcohol use: Yes    Comment: social    Drug use: No   Sexual activity: Not on file  Other Topics Concern   Not on file  Social History Narrative   She is from [place], [place] originally but has lived in [place] for 30 years. She would like to move back. She is married with 5 grown children (3 in [place] and 2 in [place]. She is a retired Retail buyer.   Social Determinants of Health   Financial Resource Strain: Not on file  Food Insecurity: No Food Insecurity (08/15/2022)   Hunger Vital Sign    Worried About Running Out of Food in the Last Year: Never true    Ran Out of Food in the Last Year: Never true  Transportation Needs: No Transportation Needs (08/13/2022)   PRAPARE - Administrator, Civil Service (Medical): No    Lack of Transportation (Non-Medical): No  Physical Activity: Not on file  Stress: Not on file  Social Connections: Not on file     Family History: The patient's family history includes Alcohol abuse in her father; Early death in her mother; Heart failure in her mother; Mental illness in her father; Stroke in her father.  ROS:   Please see the history of present illness.    As per HPI  EKGs/Labs/Other Studies Reviewed:    The following studies were reviewed today: Cardiac Studies & Procedures       ECHOCARDIOGRAM  ECHOCARDIOGRAM COMPLETE 10/27/2022  Narrative ECHOCARDIOGRAM REPORT    Patient Name:   Sabrina Hernandez Date of Exam: 10/27/2022 Medical Rec #:  [id number]         Height:       60.0 in Accession #:    [id number]       Weight:       105.0 lb Date of Birth:  April 25, 1935         BSA:          1.419 m Patient Age:    87 years         BP:           110/56 mmHg Patient Gender: F                HR:           67 bpm. Exam Location:  [address]  Procedure: 2D Echo, Cardiac Doppler and Color Doppler  Indications:  I50.20* Unspecified systolic (congestive) heart failure  History:        Patient has prior history of Echocardiogram examinations, most recent 10/01/2020. Arrythmias:LBBB, Signs/Symptoms:Shortness of Breath; Risk Factors:Hypertension. Right leg edema. Aortic atherosclerosis.  Sonographer:    Cathie Beams RCS Referring Phys: Ames Coupe DICK  IMPRESSIONS   1. Left ventricular ejection fraction, by estimation, is 20 to 25%. The left ventricle has severely decreased function. The left ventricle demonstrates global hypokinesis. The left ventricular internal cavity size was severely dilated. Left ventricular diastolic parameters are consistent with Grade I diastolic dysfunction (impaired relaxation). 2. Right ventricular systolic function is normal. The right ventricular size is normal. 3. Left atrial size was mildly dilated. 4. The mitral valve is abnormal. No evidence of mitral valve regurgitation. Moderate mitral stenosis. 5. The aortic valve is tricuspid. There is mild calcification of the aortic valve. Aortic valve regurgitation is not visualized. Aortic valve sclerosis is present, with no evidence of aortic valve stenosis. 6. The inferior vena cava is normal in size with greater than 50% respiratory variability, suggesting right atrial pressure of 3 mmHg.  FINDINGS Left Ventricle: Left ventricular ejection fraction, by estimation, is 20 to 25%. The left ventricle has severely decreased function. The left ventricle demonstrates global hypokinesis. The left ventricular internal cavity size was severely dilated. There is no left ventricular  hypertrophy. Left ventricular diastolic parameters are consistent with Grade I diastolic dysfunction (impaired relaxation).  Right Ventricle: The right ventricular size is normal. No increase in right ventricular wall thickness. Right ventricular systolic function is normal.  Left Atrium: Left atrial size was mildly dilated.  Right Atrium: Right atrial size was normal in size.  Pericardium: Trivial pericardial effusion is present. The pericardial effusion is posterior to the left ventricle.  Mitral Valve: The mitral valve is abnormal. There is mild thickening of the mitral valve leaflet(s). There is mild calcification of the mitral valve leaflet(s). No evidence of mitral valve regurgitation. Moderate mitral valve stenosis.  Tricuspid Valve: The tricuspid valve is normal in structure. Tricuspid valve regurgitation is mild . No evidence of tricuspid stenosis.  Aortic Valve: The aortic valve is tricuspid. There is mild calcification of the aortic valve. Aortic valve regurgitation is not visualized. Aortic valve sclerosis is present, with no evidence of aortic valve stenosis.  Pulmonic Valve: The pulmonic valve was normal in structure. Pulmonic valve regurgitation is not visualized. No evidence of pulmonic stenosis.  Aorta: The aortic root is normal in size and structure.  Venous: The inferior vena cava is normal in size with greater than 50% respiratory variability, suggesting right atrial pressure of 3 mmHg.  IAS/Shunts: No atrial level shunt detected by color flow Doppler.   LEFT VENTRICLE PLAX 2D LVIDd:         5.90 cm   Diastology LVIDs:         5.40 cm   LV e' medial:   4.90 cm/s LV PW:         1.60 cm   LV E/e' medial: 12.8 LV IVS:        0.70 cm LVOT diam:     2.00 cm LV SV:         47 LV SV Index:   33 LVOT Area:     3.14 cm   RIGHT VENTRICLE RV Basal diam:  2.70 cm RV S prime:     13.50 cm/s TAPSE (M-mode): 1.7 cm RVSP:           21.3 mmHg  LEFT ATRIUM  Index        RIGHT ATRIUM           Index LA diam:        4.00 cm 2.82 cm/m   RA Pressure: 3.00 mmHg LA Vol (A2C):   52.4 ml 36.92 ml/m  RA Area:     9.31 cm LA Vol (A4C):   44.2 ml 31.14 ml/m  RA Volume:   15.40 ml  10.85 ml/m LA Biplane Vol: 51.1 ml 36.00 ml/m AORTIC VALVE             PULMONIC VALVE LVOT Vmax:   70.60 cm/s  PR End Diast Vel: 6.55 msec LVOT Vmean:  46.400 cm/s LVOT VTI:    0.151 m  AORTA Ao Root diam: 3.20 cm Ao Asc diam:  3.30 cm  MITRAL VALVE                TRICUSPID VALVE MV Area (PHT): 6.17 cm     TR Peak grad:   18.3 mmHg MV Decel Time: 123 msec     TR Vmax:        214.00 cm/s MR Peak grad: 85.4 mmHg     Estimated RAP:  3.00 mmHg MR Mean grad: 41.0 mmHg     RVSP:           21.3 mmHg MR Vmax:      462.00 cm/s MR Vmean:     287.0 cm/s    SHUNTS MV E velocity: 62.90 cm/s   Systemic VTI:  0.15 m MV A velocity: 115.00 cm/s  Systemic Diam: 2.00 cm MV E/A ratio:  0.55  Charlton Haws MD Electronically signed by Charlton Haws MD Signature Date/Time: 10/27/2022/5:08:34 PM    Final              EKG:  EKG 08/02/20: NSR, LBBB, lateral q waves  Recent Labs: 08/12/2022: ALT 43; B Natriuretic Peptide 2,514.0 08/13/2022: Hemoglobin 13.7; Platelets 206 09/18/2022: Magnesium 2.3 10/02/2022: BUN 12; Creatinine, Ser 0.96; Potassium 4.7; Sodium 142  Recent Lipid Panel    Component Value Date/Time   CHOL 169 08/14/2022 0110   TRIG 53 08/14/2022 0110   HDL 66 08/14/2022 0110   CHOLHDL 2.6 08/14/2022 0110   VLDL 11 08/14/2022 0110   LDLCALC 92 08/14/2022 0110     Physical Exam:    VS:  BP 102/64   Pulse 84   Ht 5' (1.524 m)   Wt 104 lb 3.2 oz (47.3 kg)   SpO2 95%   BMI 20.35 kg/m     Wt Readings from Last 3 Encounters:  11/19/22 104 lb 3.2 oz (47.3 kg)  10/29/22 103 lb 6.4 oz (46.9 kg)  09/18/22 105 lb (47.6 kg)     GEN:  Well nourished, well developed in no acute distress HEENT: Normal NECK: No JVD; No carotid bruits CARDIAC: RRR, no murmurs,  rubs, gallops RESPIRATORY:  Clear to auscultation without rales, wheezing or rhonchi  ABDOMEN: Soft, non-tender, non-distended MUSCULOSKELETAL:  No edema; No deformity  SKIN: Warm and dry NEUROLOGIC:  Alert and oriented x 3 PSYCHIATRIC:  Normal affect   ASSESSMENT:    1. Chronic HFrEF (heart failure with reduced ejection fraction) (HCC)   2. Primary hypertension   3. Essential hypertension    PLAN:    In order of problems listed above:  #Chronic systolic heart failure: LVEF <20%. Suspected non-ischemic CM with negative myoview in 2017, however, has not had cath. Declines any invasive work-up or intervention at this time. Only  wants to proceed with medical management. Has had multiple ER presentations for acute decompensation in the setting of stopping all GDMT which has remained a barrier to care. She is currently compensated and taking most of her medications. Discussed that if she is running low, she needs to call our office/pharmacy to ensure they are refilled. She will also need to be sure to fill her medications while in [place] for 3 months. She understands and instructions were provided on the AVS. -TTE with LVEF <20%, G1DD -Continue lasix 20mg  daily -Hold coreg due to soft blood pressures -Continue entresto 24/26mg  BID  -Resume jardiance 10mg  daily -Continue spiro 25mg  daily -Declined RHC/LHC or an invasive work-up event if shortens lifespan; wants only medical management  #HTN: Soft today in 100/60s -Continue entresto 24/26mg  BID  -Continue spiro 12.5mg  daily -Holding coreg for now given soft blood pressures (and patient has been off the medication as she ran out)  Discussed importance of taking her medications and letting us know if she is running low as she decompensates quickly off medications.     Medication Adjustments/Labs and Tests Ordered: Current medicines are reviewed at length with the patient today.  Concerns regarding medicines are outlined above.  No  orders of the defined types were placed in this encounter.  Meds ordered this encounter  Medications   furosemide (LASIX) 20 MG tablet    Sig: TAKE 1 TABLET(20 MG) BY MOUTH DAILY    Dispense:  90 tablet    Refill:  3   sacubitril-valsartan (ENTRESTO) 24-26 MG    Sig: Take 1 tablet by mouth 2 (two) times daily.    Dispense:  180 tablet    Refill:  3   spironolactone (ALDACTONE) 25 MG tablet    Sig: Take 0.5 tablets (12.5 mg total) by mouth daily.    Dispense:  45 tablet    Refill:  3   empagliflozin (JARDIANCE) 10 MG TABS tablet    Sig: Take 1 tablet (10 mg total) by mouth daily before breakfast.    Dispense:  90 tablet    Refill:  3    Patient Instructions  Medication Instructions:   Your physician recommends that you continue on your current medications as directed. Please refer to the Current Medication list given to you today.   PLEASE HAVE A [facility] [place] CALL [facility] [place] TO HAVE YOUR CARDIAC MEDICATIONS (SPIRONOLACTONE, LASIX, ENTRESTO, AND JARDIANCE) TRANSFERRED THERE FOR FURTHER REFILLS WHEN NEEDED DURING YOUR STAY  *If you need a refill on your cardiac medications before your next appointment, please call your pharmacy*     Follow-Up:  4 MONTHS WITH AND EXTENDER IN THE OFFICE     Signed, Meriam Sprague, MD  11/19/2022 3:09 PM    [facility]

## 2022-11-19 ENCOUNTER — Ambulatory Visit: Payer: Medicare Other | Attending: Cardiology | Admitting: Cardiology

## 2022-11-19 ENCOUNTER — Encounter: Payer: Self-pay | Admitting: Cardiology

## 2022-11-19 VITALS — BP 102/64 | HR 84 | Ht 60.0 in | Wt 104.2 lb

## 2022-11-19 DIAGNOSIS — I5022 Chronic systolic (congestive) heart failure: Secondary | ICD-10-CM

## 2022-11-19 DIAGNOSIS — I10 Essential (primary) hypertension: Secondary | ICD-10-CM

## 2022-11-19 MED ORDER — SPIRONOLACTONE 25 MG PO TABS: 12.5000 mg | ORAL_TABLET | Freq: Every day | ORAL | 3 refills | Status: DC

## 2022-11-19 MED ORDER — ENTRESTO 24-26 MG PO TABS: 1.0000 | ORAL_TABLET | Freq: Two times a day (BID) | ORAL | 3 refills | Status: DC

## 2022-11-19 MED ORDER — FUROSEMIDE 20 MG PO TABS: ORAL_TABLET | ORAL | 3 refills | Status: DC

## 2022-11-19 MED ORDER — EMPAGLIFLOZIN 10 MG PO TABS: 10.0000 mg | ORAL_TABLET | Freq: Every day | ORAL | 3 refills | Status: DC

## 2022-11-19 NOTE — Patient Instructions (Signed)
Medication Instructions:   Your physician recommends that you continue on your current medications as directed. Please refer to the Current Medication list given to you today.   PLEASE HAVE A [facility] [place] CALL [facility] [place] TO HAVE YOUR CARDIAC MEDICATIONS (SPIRONOLACTONE, LASIX, ENTRESTO, AND JARDIANCE) TRANSFERRED THERE FOR FURTHER REFILLS WHEN NEEDED DURING YOUR STAY  *If you need a refill on your cardiac medications before your next appointment, please call your pharmacy*     Follow-Up:  4 MONTHS WITH AND EXTENDER IN THE OFFICE

## 2022-12-01 DIAGNOSIS — Z23 Encounter for immunization: Secondary | ICD-10-CM | POA: Diagnosis not present

## 2023-03-22 NOTE — Progress Notes (Deleted)
Cardiology Office Note:  .   Date:  03/22/2023  ID:  Sabrina Hernandez, DOB May 27, 1935, MRN [id number] PCP: Mliss Sax, MD  [facility] Providers Cardiologist:  Meriam Sprague, MD { Click to update primary MD,subspecialty MD or APP then REFRESH:1}   Patient Profile: .      PMH Hypertension GERD Chronic HFrEF Presumed NICM Pt has declined further intervention Myoview 09/2015 with [facility]) EF 28, no ischemia Echo 2017 EF 20-25 Echo 09/2020 EF < 20 Left BBB  Initially diagnosed with HFrEF in 2017 with EF 20-25% when she presented with worsening SOB.  Was seen at [facility].  Declined cardiac cath at that time but Myoview was negative for ischemia.  Initially placed on metop/ACE but these were later stopped due to fatigue. No ICD was placed. Prior to admission to [facility] ED 08/02/2020, she had not followed with cardiology. TTE was 2017. No ICD was placed.   Seen in [facility] ED 04/01/2021 with worsening DOE over the prior few months as well as orthopnea.  In ED ECG with NSR, LAE, LBBB, LVH. BNP 1149.  Troponin 49 >> 53.  She was diuresed with IV Lasix with good response and discharged home with plans to follow-up with cardiology.    At follow-up visit 08/09/20, BNP was 4251 and she continued to have SOB and orthopnea. Lasix 20 mg BID was resumed.  At clinic visit 25/2022 she reported black stools.  On ASA but not AC.  Aspirin was stopped was referred to GI. CBC with HgB 15.1.   TTE 08/29/2020 with LVEF < 20% with global hypokinesis, G1 DD, mild MR, small to moderate pericardial effusion.  She adamantly declined invasive testing.  ER 06/2021 with decompensated HF in setting of not taking her Lasix.  BNP 1277 and CXR with pulmonary edema.  IV Lasix was given and admission recommended but she declined and wanted to go home.  Multiple other ER visits with volume overload and chest pain.  In the ER 07/2022 with SOB and chest pain.  EF 20%.  Again she was non-compliant with medication and it  was restarted.  Clinic visit 09/18/22 with Robin Searing, NP, doing well.   Last cardiology clinic visit 11/19/22 with Dr. Shari Prows.  At that time she had ran out of carvedilol and Jardiance but was compliant with Lasix, Entresto, and spironolactone.  Due to soft BP, carvedilol was discontinued.  She was advised to continue Entresto 24-26 twice daily, Jardiance 10 mg daily, spironolactone 12.5 mg daily. She was planning to travel to [place] for a few months.        History of Present Illness: .   Sabrina Hernandez is a *** 88 y.o. female who is here today for follow-up.   ROS: ***       Studies Reviewed: .        *** Risk Assessment/Calculations:   {Does this patient have ATRIAL FIBRILLATION?[phone]} No BP recorded.  {Refresh Note OR Click here to enter BP  :1}***       Physical Exam:   VS:  There were no vitals taken for this visit.   Wt Readings from Last 3 Encounters:  11/19/22 104 lb 3.2 oz (47.3 kg)  10/29/22 103 lb 6.4 oz (46.9 kg)  09/18/22 105 lb (47.6 kg)    GEN: Well nourished, well developed in no acute distress NECK: No JVD; No carotid bruits CARDIAC: ***RRR, no murmurs, rubs, gallops RESPIRATORY:  Clear to auscultation without rales, wheezing or rhonchi  ABDOMEN: Soft,  non-tender, non-distended EXTREMITIES:  No edema; No deformity     ASSESSMENT AND PLAN: .    Chronic HFrEF: Presumed NICM with negative myoview in 2017 but she has declined further work-up of ischemia.  Hypertension: Medication management:    {Are you ordering a CV Procedure (e.g. stress test, cath, DCCV, TEE, etc)?   Press F2        :[id number]}  Dispo: ***  Signed, Eligha Bridegroom, NP-C

## 2023-03-23 ENCOUNTER — Ambulatory Visit: Payer: Medicare Other | Attending: Nurse Practitioner | Admitting: Nurse Practitioner

## 2023-03-24 ENCOUNTER — Encounter: Payer: Self-pay | Admitting: Nurse Practitioner

## 2023-05-06 DIAGNOSIS — Z23 Encounter for immunization: Secondary | ICD-10-CM | POA: Diagnosis not present

## 2023-07-26 IMAGING — DX DG CHEST 2V
2 series · 2 of 2 positions shown · non-contrast
Comparison: 08/02/2020

CLINICAL DATA: Short of breath.  History of CHF

EXAM:
CHEST - 2 VIEW

[chest pa]
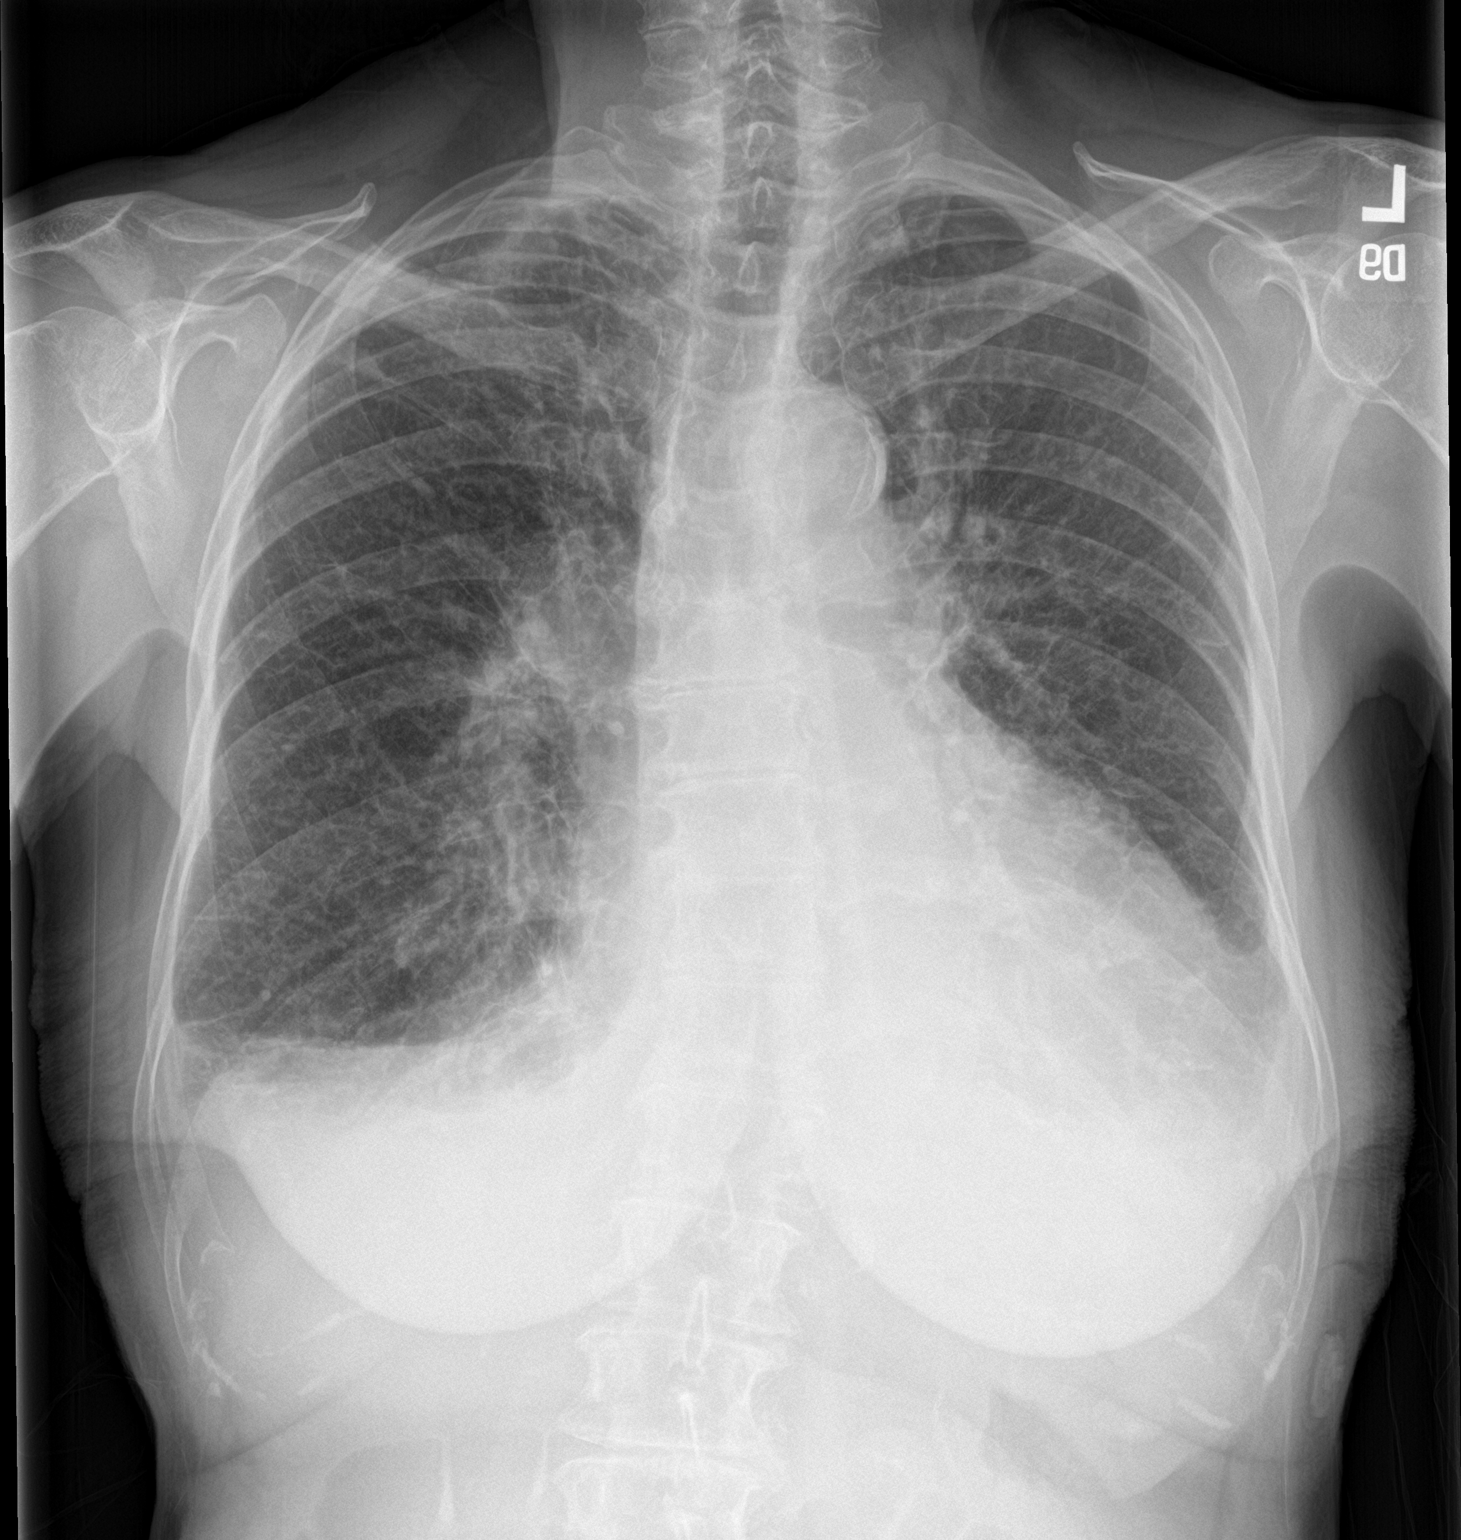

[chest lat]
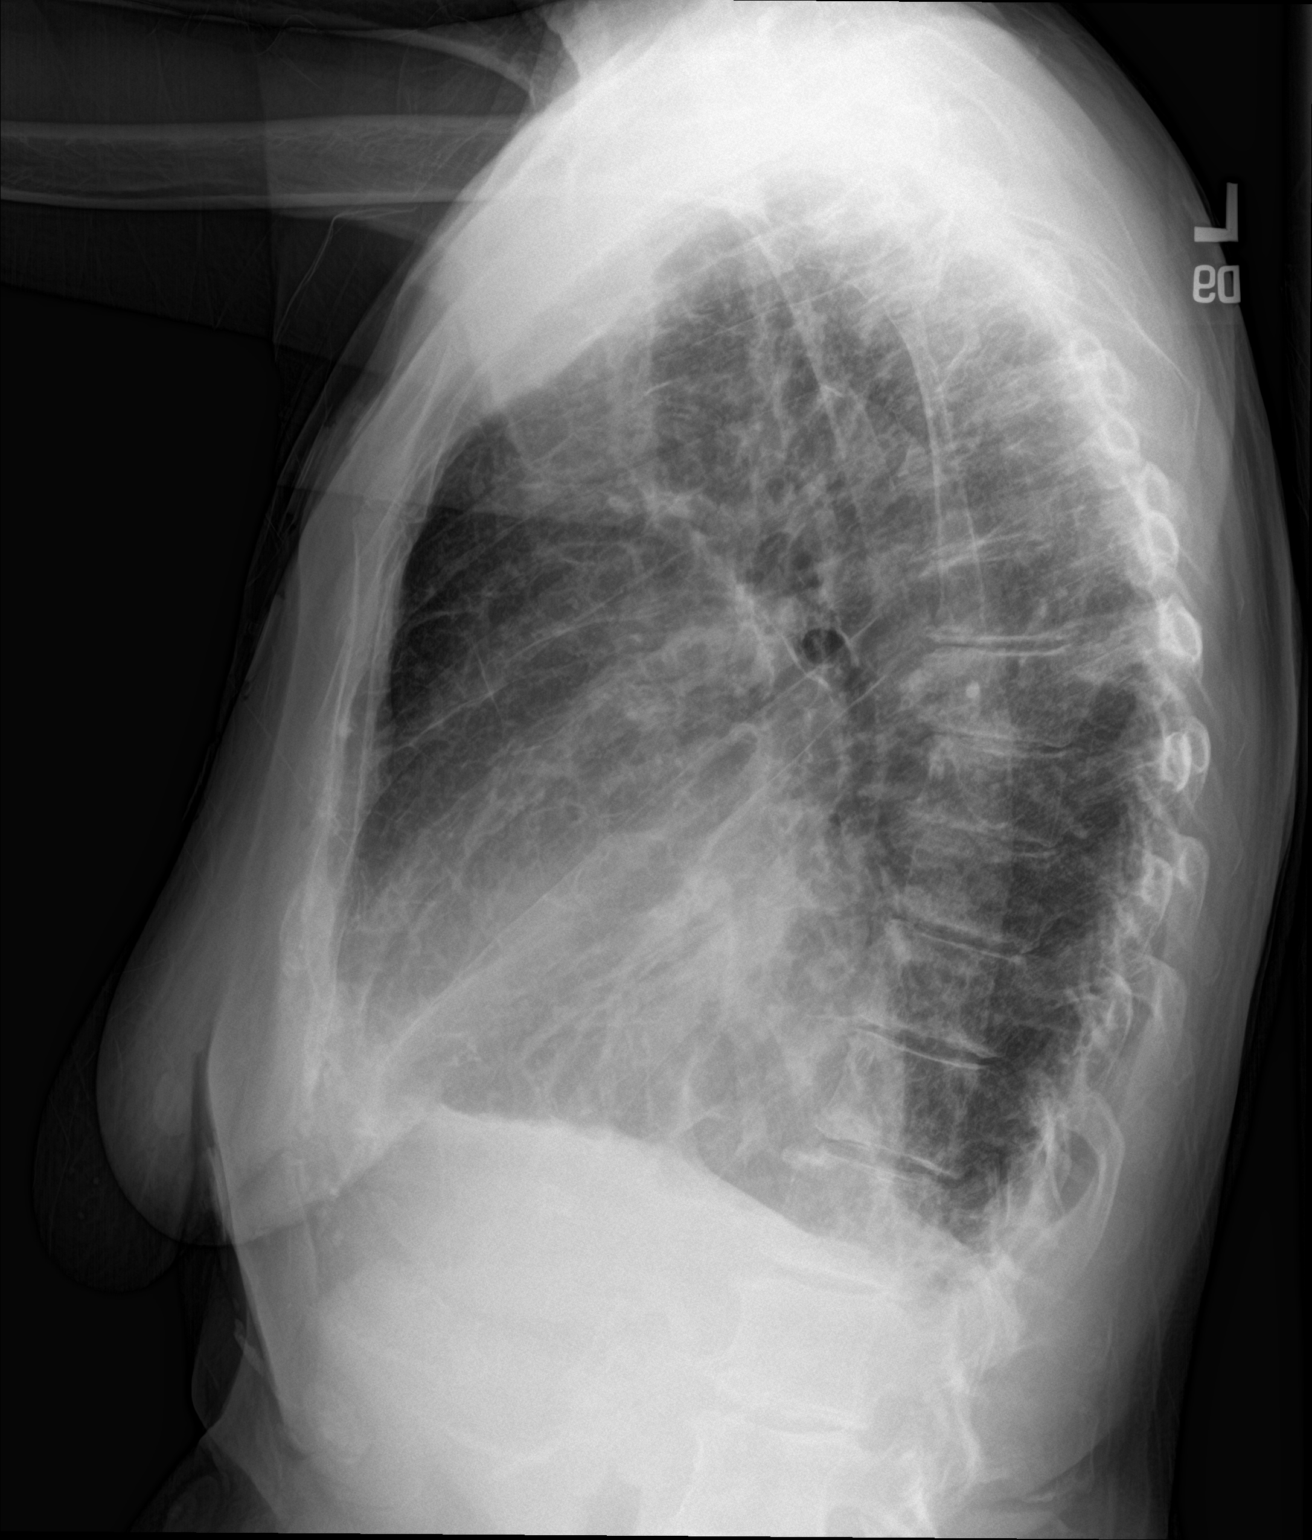

[2 of 2 positions shown; findings below may reference images not displayed]

FINDINGS: Cardiac enlargement. Mild vascular congestion without edema. Small
bilateral effusions.

Mild bibasilar atelectasis. No definite pneumonia. Apical scarring
right greater than left.
IMPRESSION: Cardiac enlargement mild vascular congestion and small effusions.
Negative for edema

Mild bibasilar airspace disease most likely atelectasis.

## 2023-10-19 ENCOUNTER — Telehealth: Payer: Self-pay

## 2023-10-19 NOTE — Telephone Encounter (Signed)
 Copied from CRM [id number]. Topic: General - Other >> Oct 19, 2023 11:12 AM Jethro Morrison wrote: Reason for CRM: THE DAUGHTER [name] [id number] CALLED REQUESTING TO SPEAK WITH DR Tilmon Font OR HIS NURSE STATED SHE HAS SOME CONCERNS ABOUT THE HEALTHCARE OF HER PARENTS Sabrina Hernandez AND Sabrina Hernandez. SHE WOULD LIKE TO SPEAK WITH DR [facility] OR HIS NURSE TO SEE WHAT STEPS TO TAKE WITH GETTING HOME HEALTH OR ASSISTANCE

## 2023-11-02 ENCOUNTER — Emergency Department (HOSPITAL_COMMUNITY)

## 2023-11-02 ENCOUNTER — Other Ambulatory Visit: Payer: Self-pay

## 2023-11-02 ENCOUNTER — Emergency Department (HOSPITAL_COMMUNITY)
Admission: EM | Admit: 2023-11-02 | Discharge: 2023-11-02 | Disposition: A | Discharge status: Home / Self Care | Attending: Emergency Medicine | Admitting: Emergency Medicine

## 2023-11-02 ENCOUNTER — Encounter (HOSPITAL_COMMUNITY): Payer: Self-pay

## 2023-11-02 DIAGNOSIS — I5020 Unspecified systolic (congestive) heart failure: Secondary | ICD-10-CM | POA: Diagnosis not present

## 2023-11-02 DIAGNOSIS — F0390 Unspecified dementia without behavioral disturbance: Secondary | ICD-10-CM | POA: Diagnosis not present

## 2023-11-02 DIAGNOSIS — M549 Dorsalgia, unspecified: Secondary | ICD-10-CM | POA: Insufficient documentation

## 2023-11-02 DIAGNOSIS — K5730 Diverticulosis of large intestine without perforation or abscess without bleeding: Secondary | ICD-10-CM | POA: Diagnosis not present

## 2023-11-02 DIAGNOSIS — M545 Low back pain, unspecified: Secondary | ICD-10-CM | POA: Diagnosis not present

## 2023-11-02 DIAGNOSIS — R109 Unspecified abdominal pain: Secondary | ICD-10-CM | POA: Insufficient documentation

## 2023-11-02 DIAGNOSIS — R079 Chest pain, unspecified: Secondary | ICD-10-CM | POA: Diagnosis not present

## 2023-11-02 DIAGNOSIS — R0602 Shortness of breath: Secondary | ICD-10-CM | POA: Insufficient documentation

## 2023-11-02 DIAGNOSIS — R110 Nausea: Secondary | ICD-10-CM | POA: Insufficient documentation

## 2023-11-02 DIAGNOSIS — R0902 Hypoxemia: Secondary | ICD-10-CM | POA: Diagnosis not present

## 2023-11-02 DIAGNOSIS — K7460 Unspecified cirrhosis of liver: Secondary | ICD-10-CM | POA: Diagnosis not present

## 2023-11-02 DIAGNOSIS — R197 Diarrhea, unspecified: Secondary | ICD-10-CM | POA: Diagnosis not present

## 2023-11-02 DIAGNOSIS — J42 Unspecified chronic bronchitis: Secondary | ICD-10-CM | POA: Diagnosis not present

## 2023-11-02 DIAGNOSIS — R1031 Right lower quadrant pain: Secondary | ICD-10-CM | POA: Diagnosis not present

## 2023-11-02 LAB — COMPREHENSIVE METABOLIC PANEL WITH GFR
ALT: 16 U/L (ref 0–44)
AST: 23 U/L (ref 15–41)
Albumin: 3.5 g/dL (ref 3.5–5.0)
Alkaline Phosphatase: 53 U/L (ref 38–126)
Anion gap: 11 (ref 5–15)
BUN: 15 mg/dL (ref 8–23)
CO2: 24 mmol/L (ref 22–32)
Calcium: 9.1 mg/dL (ref 8.9–10.3)
Chloride: 103 mmol/L (ref 98–111)
Creatinine, Ser: 1.03 mg/dL — ABNORMAL HIGH (ref 0.44–1.00)
GFR, Estimated: 53 mL/min — ABNORMAL LOW (ref >60)
Glucose, Bld: 87 mg/dL (ref 70–99)
Potassium: 4.2 mmol/L (ref 3.5–5.1)
Sodium: 138 mmol/L (ref 135–145)
Total Bilirubin: 1.8 mg/dL — ABNORMAL HIGH (ref 0.0–1.2)
Total Protein: 5.8 g/dL — ABNORMAL LOW (ref 6.5–8.1)

## 2023-11-02 LAB — CBC WITH DIFFERENTIAL/PLATELET
Abs Immature Granulocytes: 0.01 10*3/uL (ref 0.00–0.07)
Basophils Absolute: 0 10*3/uL (ref 0.0–0.1)
Basophils Relative: 1 %
Eosinophils Absolute: 0.1 10*3/uL (ref 0.0–0.5)
Eosinophils Relative: 1 %
HCT: 38.9 % (ref 36.0–46.0)
Hemoglobin: 13 g/dL (ref 12.0–15.0)
Immature Granulocytes: 0 %
Lymphocytes Relative: 28 %
Lymphs Abs: 1.1 10*3/uL (ref 0.7–4.0)
MCH: 31.6 pg (ref 26.0–34.0)
MCHC: 33.4 g/dL (ref 30.0–36.0)
MCV: 94.6 fL (ref 80.0–100.0)
Monocytes Absolute: 0.3 10*3/uL (ref 0.1–1.0)
Monocytes Relative: 8 %
Neutro Abs: 2.4 10*3/uL (ref 1.7–7.7)
Neutrophils Relative %: 62 %
Platelets: 198 10*3/uL (ref 150–400)
RBC: 4.11 MIL/uL (ref 3.87–5.11)
RDW: 12.7 % (ref 11.5–15.5)
WBC: 3.9 10*3/uL — ABNORMAL LOW (ref 4.0–10.5)
nRBC: 0 % (ref 0.0–0.2)

## 2023-11-02 LAB — URINALYSIS, ROUTINE W REFLEX MICROSCOPIC
Bacteria, UA: NONE SEEN
Bilirubin Urine: NEGATIVE
Glucose, UA: NEGATIVE mg/dL
Hgb urine dipstick: NEGATIVE
Ketones, ur: 5 mg/dL — AB
Leukocytes,Ua: NEGATIVE
Nitrite: NEGATIVE
Protein, ur: 30 mg/dL — AB
Specific Gravity, Urine: 1.016 (ref 1.005–1.030)
pH: 6 (ref 5.0–8.0)

## 2023-11-02 LAB — LIPASE, BLOOD: Lipase: 28 U/L (ref 11–51)

## 2023-11-02 LAB — TROPONIN I (HIGH SENSITIVITY)
Troponin I (High Sensitivity): 21 ng/L — ABNORMAL HIGH (ref <18)
Troponin I (High Sensitivity): 22 ng/L — ABNORMAL HIGH (ref <18)

## 2023-11-02 MED ORDER — IOHEXOL 350 MG/ML SOLN
75.0000 mL | Freq: Once | INTRAVENOUS | Status: AC | PRN
  Administered 2023-11-02: 75 mL via INTRAVENOUS

## 2023-11-02 MED ORDER — IBUPROFEN 400 MG PO TABS
400.0000 mg | ORAL_TABLET | Freq: Once | ORAL | Status: AC
  Administered 2023-11-02: 400 mg via ORAL
  Filled 2023-11-02: qty 1

## 2023-11-02 MED ORDER — FENTANYL CITRATE PF 50 MCG/ML IJ SOSY: 50.0000 ug | PREFILLED_SYRINGE | Freq: Once | INTRAMUSCULAR | Status: DC

## 2023-11-02 MED ORDER — ONDANSETRON HCL 4 MG/2ML IJ SOLN
4.0000 mg | Freq: Once | INTRAMUSCULAR | Status: AC
  Administered 2023-11-02: 4 mg via INTRAVENOUS
  Filled 2023-11-02: qty 2

## 2023-11-02 NOTE — ED Triage Notes (Signed)
 Pt BIB [facility] from home d/t sudden onset Rt flank pain that started this morning. She does have cognitive impairments at baseline, pt very emotional & her husband/daughter is at bedside. EMS reports she has intermittent sharp pains in that area with more consistent dull aching pains. Did have some nausea/dry heaves while en route, does not present with nausea upon arrival.

## 2023-11-02 NOTE — ED Notes (Signed)
 Tech attempted to get pt. Hooked to monitor. As tech was getting electrodes placed, pt. Stated, "this is too much, I don't want all of this". Tech asked, "would you like for me to stop?", pt. Stated, yes. Tech said ok. RN aware

## 2023-11-02 NOTE — ED Provider Notes (Signed)
 He is got multiple ones with pain [place] EM[facility]GENCY DEPARTMENT AT [facility] Provider Note   CSN: [id number] Arrival date & time: 11/02/23  0744     History  Chief Complaint  Patient presents with   Right Flank Pain    Sabrina Hernandez is a 89 y.o. female.  HPI Patient presents with right sided abdominal/flank pain.  Began last night.  Does have some dementia.  No dysuria.  Did not eat.  No fevers.  Does feel little short of breath.  Reported to have some nausea previously.   Past Surgical History:  Procedure Laterality Date   CERVICAL CONE BIOPSY     HAMMER TOE SURGERY     right foot    ORIF PATELLA Left 01/29/2015   Procedure: OPEN REDUCTION INTERNAL (ORIF) FIXATION LEFT PATELLA;  Surgeon: Claiborne Crew, MD;  Location: [facility] ORS;  Service: Orthopedics;  Laterality: Left;   Past Medical History:  Diagnosis Date   Diverticulitis    HFrEF (heart failure with reduced ejection fraction) (HCC) 07/30/2021   Presumed nonischemic cardiomyopathy  Myoview 4/17 ([facility]): EF 28, no ischemia Intolerant of metoprolol /ACE inhibitor due to fatigue Echo 2017: EF 20-25 Echocardiogram 10/01/2020:  EF <20, normal RVSF, trivial pericardial effusion Echocardiogram 08/30/2020: EF <20, global HK, GR 1 DD, normal RVSF, mild MR, AV sclerosis without stenosis, small-moderate pericardial effusion     Home Medications Prior to Admission medications   Medication Sig Start Date End Date Taking? Authorizing Provider  furosemide  (LASIX ) 20 MG tablet TAKE 1 TABLET(20 MG) BY MOUTH DAILY 11/19/22  Yes Sonny Dust, MD  Multiple Vitamin (MULTIVITAMIN WITH MINERALS) TABS tablet Take 1 tablet by mouth every morning.   Yes [provider]  sacubitril -valsartan  (ENTRESTO ) 24-26 MG Take 1 tablet by mouth 2 (two) times daily. 11/19/22  Yes Sonny Dust, MD      Allergies    Patient has no known allergies.    Review of Systems   Review of Systems  Physical Exam Updated Vital  Signs BP 120/61 (BP Location: Right Arm)   Pulse 74   Temp 99.2 F (37.3 C) (Oral)   Resp 16   Ht 5' (1.524 m)   Wt 47.6 kg   SpO2 97%   BMI 20.51 kg/m  Physical Exam Vitals and nursing note reviewed.  HENT:     Head: Normocephalic.  Cardiovascular:     Rate and Rhythm: Regular rhythm.  Chest:     Chest wall: No tenderness.  Abdominal:     Tenderness: There is no guarding.  Musculoskeletal:        General: No tenderness.     Cervical back: Neck supple.  Skin:    Findings: No rash.  Neurological:     Mental Status: She is alert. Mental status is at baseline.     ED Results / Procedures / Treatments   Labs (all labs ordered are listed, but only abnormal results are displayed) Labs Reviewed  URINALYSIS, ROUTINE W REFLEX MICROSCOPIC - Abnormal; Notable for the following components:      Result Value   Ketones, ur 5 (*)    Protein, ur 30 (*)    All other components within normal limits  COMPREHENSIVE METABOLIC PANEL WITH GFR - Abnormal; Notable for the following components:   Creatinine, Ser 1.03 (*)    Total Protein 5.8 (*)    Total Bilirubin 1.8 (*)    GFR, Estimated 53 (*)    All other components within normal limits  CBC WITH DIFFERENTIAL/PLATELET - Abnormal; Notable for the following components:   WBC 3.9 (*)    All other components within normal limits  TROPONIN I (HIGH SENSITIVITY) - Abnormal; Notable for the following components:   Troponin I (High Sensitivity) 21 (*)    All other components within normal limits  TROPONIN I (HIGH SENSITIVITY) - Abnormal; Notable for the following components:   Troponin I (High Sensitivity) 22 (*)    All other components within normal limits  LIPASE, BLOOD    EKG None  Radiology CT ABDOMEN PELVIS W CONTRAST Result Date: 11/02/2023 CLINICAL DATA:  Right lower quadrant abdominal pain EXAM: CT ABDOMEN AND PELVIS WITH CONTRAST TECHNIQUE: Multidetector CT imaging of the abdomen and pelvis was performed using the standard  protocol following bolus administration of intravenous contrast. RADIATION DOSE REDUCTION: This exam was performed according to the departmental dose-optimization program which includes automated exposure control, adjustment of the mA and/or kV according to patient size and/or use of iterative reconstruction technique. CONTRAST:  75mL OMNIPAQUE  IOHEXOL  350 MG/ML SOLN COMPARISON:  05/16/2022 FINDINGS: Lower chest: Mitral valve calcification. Descending thoracic aortic atherosclerotic vascular calcification. Mild cardiomegaly. Hepatobiliary: Contracted gallbladder. No biliary dilatation or significant focal parenchymal lesion in liver. Borderline hepatic morphology for cirrhosis. Pancreas: Unremarkable Spleen: Unremarkable Adrenals/Urinary Tract: Unremarkable Stomach/Bowel: Sigmoid colon diverticulosis. Normal appendix. No dilated bowel. Chronic hazy stranding in the left lower quadrant omentum without overt nodularity. Do not see well-defined associated bowel wall thickening Vascular/Lymphatic: Atherosclerosis is present, including aortoiliac atherosclerotic disease. Reproductive: Unremarkable Other: No supplemental non-categorized findings. Musculoskeletal: Dextroconvex lumbar scoliosis with rotary component. Right sacral Tarlov cyst noted. Grade 1 degenerative anterolisthesis at L5-S1. IMPRESSION: 1. No specific abnormality is identified to explain the patient's right lower quadrant pain. The appendix is normal. 2. Sigmoid colon diverticulosis. 3. Chronic hazy stranding in the left lower quadrant omentum without overt nodularity. Although nonspecific this is probably the result of remote inflammation. 4. Borderline hepatic morphology for cirrhosis. 5. Mild cardiomegaly. Mitral valve calcification. 6. Dextroconvex lumbar scoliosis with rotary component. 7. Grade 1 degenerative anterolisthesis at L5-S1. 8.  Aortic Atherosclerosis (ICD10-I70.0). Electronically Signed   By: Freida Jes M.D.   On: 11/02/2023  12:26   DG Chest Portable 1 View Result Date: 11/02/2023 CLINICAL DATA:  Chest pain EXAM: PORTABLE CHEST 1 VIEW COMPARISON:  08/12/2022 FINDINGS: Normal mediastinum and cardiac silhouette. Chronic central bronchitic markings. Normal pulmonary vasculature. No effusion, infiltrate, or pneumothorax. IMPRESSION: 1. No acute cardiopulmonary process. 2. Chronic bronchitic markings. Electronically Signed   By: Deboraha Fallow M.D.   On: 11/02/2023 09:35    Procedures Procedures    Medications Ordered in ED Medications  ondansetron  (ZOFRAN ) injection 4 mg (4 mg Intravenous Given 11/02/23 0856)  ibuprofen (ADVIL) tablet 400 mg (400 mg Oral Given 11/02/23 1108)  iohexol  (OMNIPAQUE ) 350 MG/ML injection 75 mL (75 mLs Intravenous Contrast Given 11/02/23 1202)    ED Course/ Medical Decision Making/ A&P                                 Medical Decision Making Amount and/or Complexity of Data Reviewed Labs: ordered. Radiology: ordered.  Risk Prescription drug management.   Patient with right flank/back pain.  Differential diagnosis along with includes cause such as pneumonia, abdominal pain, shingles.  Reviewing notes and previous visits.  Has had previous CTAs without pulmonary embolism.  Has had previous pneumonias.  Differential diagnosis also includes urinary tract infection.  Will  get urinalysis x-ray and blood work.  Urinalysis reassuring blood work overall reassuring.  CT scan done due to continued pain in the elderly.  No clear cause found.  Appears stable for discharge home.  Discussed with patient and her family.        Final Clinical Impression(s) / ED Diagnoses Final diagnoses:  Flank pain    Rx / DC Orders ED Discharge Orders     None         Mozell Arias, MD 11/02/23 1514

## 2023-11-05 ENCOUNTER — Emergency Department (HOSPITAL_BASED_OUTPATIENT_CLINIC_OR_DEPARTMENT_OTHER)
Admission: EM | Admit: 2023-11-05 | Discharge: 2023-11-05 | Disposition: A | Discharge status: Home / Self Care | Attending: Emergency Medicine | Admitting: Emergency Medicine

## 2023-11-05 ENCOUNTER — Emergency Department (HOSPITAL_BASED_OUTPATIENT_CLINIC_OR_DEPARTMENT_OTHER)

## 2023-11-05 ENCOUNTER — Other Ambulatory Visit: Payer: Self-pay

## 2023-11-05 DIAGNOSIS — R1011 Right upper quadrant pain: Secondary | ICD-10-CM | POA: Insufficient documentation

## 2023-11-05 DIAGNOSIS — M549 Dorsalgia, unspecified: Secondary | ICD-10-CM | POA: Insufficient documentation

## 2023-11-05 DIAGNOSIS — R142 Eructation: Secondary | ICD-10-CM | POA: Diagnosis not present

## 2023-11-05 LAB — COMPREHENSIVE METABOLIC PANEL WITH GFR
ALT: 15 U/L (ref 0–44)
AST: 23 U/L (ref 15–41)
Albumin: 4.1 g/dL (ref 3.5–5.0)
Alkaline Phosphatase: 69 U/L (ref 38–126)
Anion gap: 13 (ref 5–15)
BUN: 19 mg/dL (ref 8–23)
CO2: 24 mmol/L (ref 22–32)
Calcium: 9.6 mg/dL (ref 8.9–10.3)
Chloride: 103 mmol/L (ref 98–111)
Creatinine, Ser: 1.03 mg/dL — ABNORMAL HIGH (ref 0.44–1.00)
GFR, Estimated: 53 mL/min — ABNORMAL LOW (ref >60)
Glucose, Bld: 79 mg/dL (ref 70–99)
Potassium: 4 mmol/L (ref 3.5–5.1)
Sodium: 140 mmol/L (ref 135–145)
Total Bilirubin: 0.9 mg/dL (ref 0.0–1.2)
Total Protein: 6.7 g/dL (ref 6.5–8.1)

## 2023-11-05 LAB — URINALYSIS, ROUTINE W REFLEX MICROSCOPIC
Bilirubin Urine: NEGATIVE
Glucose, UA: NEGATIVE mg/dL
Ketones, ur: 40 mg/dL — AB
Nitrite: NEGATIVE
Protein, ur: NEGATIVE mg/dL
Specific Gravity, Urine: 1.025 (ref 1.005–1.030)
pH: 5.5 (ref 5.0–8.0)

## 2023-11-05 LAB — CBC WITH DIFFERENTIAL/PLATELET
Abs Immature Granulocytes: 0.01 10*3/uL (ref 0.00–0.07)
Basophils Absolute: 0 10*3/uL (ref 0.0–0.1)
Basophils Relative: 1 %
Eosinophils Absolute: 0 10*3/uL (ref 0.0–0.5)
Eosinophils Relative: 1 %
HCT: 35.7 % — ABNORMAL LOW (ref 36.0–46.0)
Hemoglobin: 12.1 g/dL (ref 12.0–15.0)
Immature Granulocytes: 0 %
Lymphocytes Relative: 24 %
Lymphs Abs: 0.8 10*3/uL (ref 0.7–4.0)
MCH: 31.2 pg (ref 26.0–34.0)
MCHC: 33.9 g/dL (ref 30.0–36.0)
MCV: 92 fL (ref 80.0–100.0)
Monocytes Absolute: 0.3 10*3/uL (ref 0.1–1.0)
Monocytes Relative: 9 %
Neutro Abs: 2.4 10*3/uL (ref 1.7–7.7)
Neutrophils Relative %: 65 %
Platelets: 190 10*3/uL (ref 150–400)
RBC: 3.88 MIL/uL (ref 3.87–5.11)
RDW: 12.8 % (ref 11.5–15.5)
WBC: 3.6 10*3/uL — ABNORMAL LOW (ref 4.0–10.5)
nRBC: 0 % (ref 0.0–0.2)

## 2023-11-05 LAB — URINALYSIS, MICROSCOPIC (REFLEX)

## 2023-11-05 LAB — LIPASE, BLOOD: Lipase: 21 U/L (ref 11–51)

## 2023-11-05 MED ORDER — OMEPRAZOLE 20 MG PO CPDR: 20.0000 mg | DELAYED_RELEASE_CAPSULE | Freq: Every day | ORAL | 1 refills | Status: AC

## 2023-11-05 MED ORDER — SIMETHICONE 125 MG PO CAPS: ORAL_CAPSULE | ORAL | 0 refills | Status: AC

## 2023-11-05 MED ORDER — LIDOCAINE VISCOUS HCL 2 % MT SOLN
15.0000 mL | Freq: Once | OROMUCOSAL | Status: AC
  Administered 2023-11-05: 15 mL via ORAL
  Filled 2023-11-05: qty 15

## 2023-11-05 MED ORDER — ALUM & MAG HYDROXIDE-SIMETH 200-200-20 MG/5ML PO SUSP
30.0000 mL | Freq: Once | ORAL | Status: AC
  Administered 2023-11-05: 30 mL via ORAL
  Filled 2023-11-05: qty 30

## 2023-11-05 NOTE — Discharge Instructions (Addendum)

## 2023-11-05 NOTE — ED Triage Notes (Signed)
 Pt POV in wheelchair- c/o mid and lower back pain that radiates to the R.  Seen here 5/5 for same. Denies urinary sx, ShOB, chest pain, paraesthesia.    Denies known injury.

## 2023-11-05 NOTE — ED Provider Notes (Signed)
 [place] EMERGENCY DEPARTMENT AT [facility] Provider Note   CSN: [id number] Arrival date & time: 11/05/23  1122     History  Chief Complaint  Patient presents with   Back Pain    Sabrina Hernandez is a 89 y.o. female who was in the emergency department with a chief complaint of belching, right upper quadrant abdominal pain and right flank pain.  She was seen for similar symptoms 3 days ago and had an extensive workup including labs and CT of the abdomen and pelvis with contrast that showed no acute findings.  She returns today due to her discomfort.  Her husband is also extremely concerned about her belching which seems to be forceful, loud and very frequent.  She reports having intermittent, colicky and aching pain which usually starts in her right upper back area and then migrates around to the front of her abdomen.  It comes and goes and last for several hours at a time.  She denies any nausea or vomiting.  Her husband also reports that she belches uncontrollably.  Sometimes she is not having any issues and then sometimes it is extremely loud.  He reports that she appears uncomfortable but she denies any pain with her belching.  She does not take any acid reducing medicines has not tried any medication for pain and has not seen a GI specialist in the past.  No previous reported history of hiatal hernia.   Back Pain      Home Medications Prior to Admission medications   Medication Sig Start Date End Date Taking? Authorizing Provider  omeprazole  (PRILOSEC) 20 MG capsule Take 1 capsule (20 mg total) by mouth daily. 11/05/23  Yes Tami Barren, PA-C  Simethicone  125 MG CAPS Take 1 tablet (125 mg) 4 times daily after meals and at bedtime. 11/05/23  Yes Natisha Trzcinski, PA-C  furosemide  (LASIX ) 20 MG tablet TAKE 1 TABLET(20 MG) BY MOUTH DAILY 11/19/22   Sonny Dust, MD  Multiple Vitamin (MULTIVITAMIN WITH MINERALS) TABS tablet Take 1 tablet by mouth every morning.     [provider]  sacubitril -valsartan  (ENTRESTO ) 24-26 MG Take 1 tablet by mouth 2 (two) times daily. 11/19/22   Sonny Dust, MD      Allergies    Patient has no known allergies.    Review of Systems   Review of Systems  Musculoskeletal:  Positive for back pain.    Physical Exam Updated Vital Signs BP (!) 152/72 (BP Location: Right Arm)   Pulse 66   Temp 97.7 F (36.5 C) (Oral)   Resp 18   Ht 5' (1.524 m)   Wt 47.6 kg   SpO2 96%   BMI 20.51 kg/m  Physical Exam Vitals and nursing note reviewed.  Constitutional:      General: She is not in acute distress.    Appearance: She is well-developed. She is not diaphoretic.  HENT:     Head: Normocephalic and atraumatic.     Right Ear: External ear normal.     Left Ear: External ear normal.     Nose: Nose normal.     Mouth/Throat:     Mouth: Mucous membranes are moist.  Eyes:     General: No scleral icterus.    Conjunctiva/sclera: Conjunctivae normal.  Cardiovascular:     Rate and Rhythm: Normal rate and regular rhythm.     Heart sounds: Normal heart sounds. No murmur heard.    No friction rub. No gallop.  Pulmonary:  Effort: Pulmonary effort is normal. No respiratory distress.     Breath sounds: Normal breath sounds.  Abdominal:     General: Bowel sounds are normal. There is no distension.     Palpations: Abdomen is soft. There is no mass.     Tenderness: There is no abdominal tenderness. There is no right CVA tenderness, left CVA tenderness or guarding.  Musculoskeletal:     Cervical back: Normal range of motion.  Skin:    General: Skin is warm and dry.  Neurological:     Mental Status: She is alert and oriented to person, place, and time.  Psychiatric:        Behavior: Behavior normal.    ED Results / Procedures / Treatments   Labs (all labs ordered are listed, but only abnormal results are displayed) Labs Reviewed  CBC WITH DIFFERENTIAL/PLATELET - Abnormal; Notable for the following  components:      Result Value   WBC 3.6 (*)    HCT 35.7 (*)    All other components within normal limits  COMPREHENSIVE METABOLIC PANEL WITH GFR - Abnormal; Notable for the following components:   Creatinine, Ser 1.03 (*)    GFR, Estimated 53 (*)    All other components within normal limits  URINALYSIS, ROUTINE W REFLEX MICROSCOPIC - Abnormal; Notable for the following components:   Hgb urine dipstick SMALL (*)    Ketones, ur 40 (*)    Leukocytes,Ua TRACE (*)    All other components within normal limits  URINALYSIS, MICROSCOPIC (REFLEX) - Abnormal; Notable for the following components:   Bacteria, UA RARE (*)    All other components within normal limits  LIPASE, BLOOD    EKG None  Radiology US  Abdomen Limited RUQ (LIVER/GB) Result Date: 11/05/2023 CLINICAL DATA:  Right upper quadrant pain EXAM: ULTRASOUND ABDOMEN LIMITED RIGHT UPPER QUADRANT COMPARISON:  CT 11/02/2023 FINDINGS: Gallbladder: Gallbladder is underdistended. No shadowing stones. No wall thickening or adjacent fluid Common bile duct: Diameter: 5 mm Liver: No focal lesion identified. Within normal limits in parenchymal echogenicity. Portal vein is patent on color Doppler imaging with normal direction of blood flow towards the liver. Other: None. IMPRESSION: No gallstones or ductal dilatation.  Gallbladder is underdistended. Electronically Signed   By: Adrianna Horde M.D.   On: 11/05/2023 14:08    Procedures Procedures    Medications Ordered in ED Medications  alum & mag hydroxide-simeth (MAALOX/MYLANTA) 200-200-20 MG/5ML suspension 30 mL (30 mLs Oral Given 11/05/23 1454)    And  lidocaine  (XYLOCAINE ) 2 % viscous mouth solution 15 mL (15 mLs Oral Given 11/05/23 1454)    ED Course/ Medical Decision Making/ A&P                                 Medical Decision Making  89 year old female with complaint of eructation and right upper quadrant abdominal pain.  The emergent DDX for RUQ pain includes but is not limited to  Glabladder disease, PUD, Acute Hepatitis, Pancreatitis, pyelonephritis, Pneumonia, Lower lobe PE/Infarct, Kidney stone, GERD, retrocecal appendicitis, Fitz-Hugh-Curtis syndrome, AAA, MI, Zoster.  Patient history of mild cognitive decline contributes to complication and evaluation however husband at bedside is able to further her history.  On physical examination she has a benign abdominal exam.  I reviewed and ordered labs.  There are no acute findings and no significant changes from her previous workup just recently. She does not appear to have a urinary tract  infection is not complaining of urinary symptoms.  Ordered a right upper quadrant ultrasound to rule out biliary colic as the underlying issue.  She does not have any evidence of gallbladder wall thickening, pericholecystic fluid accumulation or gallstones.  She has no obvious Murphy sign or tenderness.  She may be experiencing intermittent biliary dyskinesia and I feel that it would be helpful for her to follow-up with a GI specialist.  I treated the patient in the emergency department with GI cocktail including viscous lidocaine  and Maalox.  She has no pain at this time.  She has not had any further significant develop belching here in the emergency department.  Current plan is to discharge with omeprazole  and simethicone .  Given GI referral and PCP follow-up.  Discussed return precautions.  Appropriate for discharge at this time.   Amount and/or Complexity of Data Reviewed Labs: ordered.    Details: Reassuring  Radiology: ordered and independent interpretation performed.  Risk OTC drugs. Prescription drug management.           Final Clinical Impression(s) / ED Diagnoses Final diagnoses:  Colicky RUQ abdominal pain  Eructation    Rx / DC Orders ED Discharge Orders          Ordered    omeprazole  (PRILOSEC) 20 MG capsule  Daily        11/05/23 1604    Simethicone  125 MG CAPS        11/05/23 1604               Tama Fails, PA-C 11/07/23 1939    Lowery Rue, DO 11/10/23 2339

## 2023-11-05 NOTE — ED Notes (Signed)
 I was walking back from my patient's room past this room and noticed the pt was standing in the doorway. I made eye contact with the husband who advised they're "not doing well". At this point I noticed the pt was standing over the trashcan bleeding into it, trying to pull the tape off of her arm, as she had already pulled out her IV. I placed gauze over the bleeding hole. The pt advised she was going to leave now whether or not we wanted her to wait, AMA. I found the pt was going to be up for discharge and got an RN to print her paperwork. She was discharged later.

## 2023-11-05 NOTE — ED Notes (Signed)
 Pt pulled her own IV out.  D/c paperwork given

## 2023-11-12 NOTE — Progress Notes (Unsigned)
 [facility] [address] [place] [address] Dept: [phone] Dept Fax: [phone]    Subjective:   Sabrina Hernandez 1935-06-02 11/13/2023  No chief complaint on file.   HPI:  Sabrina Hernandez is an 89 year old female who presents with her daughter Sabrina Amonett) with concerns of cognitive decline.  She was seen by her PCP Dr. Rendell Carrel on 10/29/2023 for concerns of cognition, alcohol  use, grieving/depression.  It was noted that patient had been started on sertraline  due to dysthymia, but is unsure if she ever started taking the medication.  Drinks on average ***per day.  Patient lives at home with ***.  Does have a home health coming out to house *** times per week.  Daughter lives out of town.  Patient had been referred to neurology by PCP, but referral note on 11/04/2023 states that the daughter declined to schedule the neurology appointment.  She was also referred to [facility] for alcohol  use.    Further review of patient's chart, cognitive decline concerns have been noted by PCP and family as of last year on her office visit appointment on 08/05/2022.     The following portions of the patient's history were reviewed and updated as appropriate: past medical history, past surgical history, family history, social history, allergies, medications, and problem list.   Patient Active Problem List   Diagnosis Date Noted   Grieving 10/29/2022   Abnormal CT of the chest 08/26/2022   Cognitive decline 08/25/2022   Alcohol  use 08/25/2022   Dysthymia 08/25/2022   Acute on chronic systolic congestive heart failure, NYHA class 3 (HCC) 08/13/2022   Non compliance w medication regimen 08/13/2022   Acute on chronic HFrEF (heart failure with reduced ejection fraction) (HCC) 07/17/2022   HFrEF (heart failure with reduced ejection fraction) (HCC) 07/30/2021   Reactive airway disease 08/04/2018   Cough 08/04/2018   History of CHF (congestive  heart failure) 08/04/2018   Dental abscess 11/04/2016   Abnormal liver enzymes 05/05/2016   Leg edema, right 10/30/2015   Microhematuria 10/30/2015   Lip ulcer 06/28/2015   HTN (hypertension) 04/30/2015   Gastroesophageal reflux disease without esophagitis 04/30/2015   Left patella fracture 01/29/2015   Past Medical History:  Diagnosis Date   Diverticulitis    HFrEF (heart failure with reduced ejection fraction) (HCC) 07/30/2021   Presumed nonischemic cardiomyopathy  Myoview 4/17 ([facility]): EF 28, no ischemia Intolerant of metoprolol /ACE inhibitor due to fatigue Echo 2017: EF 20-25 Echocardiogram 10/01/2020:  EF <20, normal RVSF, trivial pericardial effusion Echocardiogram 08/30/2020: EF <20, global HK, GR 1 DD, normal RVSF, mild MR, AV sclerosis without stenosis, small-moderate pericardial effusion    Past Surgical History:  Procedure Laterality Date   CERVICAL CONE BIOPSY     HAMMER TOE SURGERY     right foot    ORIF PATELLA Left 01/29/2015   Procedure: OPEN REDUCTION INTERNAL (ORIF) FIXATION LEFT PATELLA;  Surgeon: Claiborne Crew, MD;  Location: [facility];  Service: Orthopedics;  Laterality: Left;   Family History  Problem Relation Age of Onset   Heart failure Mother    Early death Mother    Alcohol  abuse Father    Mental illness Father    Stroke Father     Current Outpatient Medications:    furosemide  (LASIX ) 20 MG tablet, TAKE 1 TABLET(20 MG) BY MOUTH DAILY, Disp: 90 tablet, Rfl: 3   Multiple Vitamin (MULTIVITAMIN WITH MINERALS) TABS tablet, Take 1 tablet by mouth every morning., Disp: , Rfl:  omeprazole  (PRILOSEC) 20 MG capsule, Take 1 capsule (20 mg total) by mouth daily., Disp: 30 capsule, Rfl: 1   sacubitril -valsartan  (ENTRESTO ) 24-26 MG, Take 1 tablet by mouth 2 (two) times daily., Disp: 180 tablet, Rfl: 3   Simethicone  125 MG CAPS, Take 1 tablet (125 mg) 4 times daily after meals and at bedtime., Disp: 28 capsule, Rfl: 0 No Known Allergies   ROS: A complete ROS was  performed with pertinent positives/negatives noted in the HPI. The remainder of the ROS are negative.    Objective:   There were no vitals filed for this visit.  GENERAL: Well-appearing, in NAD. Well nourished.  SKIN: Pink, warm and dry. No rash, lesion, ulceration, or ecchymoses.  NECK: Trachea midline. Full ROM w/o pain or tenderness. No lymphadenopathy.  RESPIRATORY: Chest wall symmetrical. Respirations even and non-labored. Breath sounds clear to auscultation bilaterally.  CARDIAC: S1, S2 present, regular rate and rhythm. Peripheral pulses 2+ bilaterally.  EXTREMITIES: Without clubbing, cyanosis, or edema.  PSYCH/MENTAL STATUS: Alert, oriented x ***. Cooperative, appropriate mood and affect.         No data to display             No results found for any visits on 11/13/23.  The ASCVD Risk score (Arnett DK, et al., 2019) failed to calculate for the following reasons:   The 2019 ASCVD risk score is only valid for ages 28 to 60     Assessment & Plan:   No orders of the defined types were placed in this encounter.  No images are attached to the encounter or orders placed in the encounter. No orders of the defined types were placed in this encounter.   No follow-ups on file.   Gavin Kast, FNP

## 2023-11-13 ENCOUNTER — Encounter: Payer: Self-pay | Admitting: Internal Medicine

## 2023-11-13 ENCOUNTER — Ambulatory Visit: Admitting: Family Medicine

## 2023-11-13 ENCOUNTER — Ambulatory Visit (INDEPENDENT_AMBULATORY_CARE_PROVIDER_SITE_OTHER): Admitting: Internal Medicine

## 2023-11-13 VITALS — BP 130/78 | HR 75 | Temp 98.4°F | Ht 60.0 in | Wt 108.6 lb

## 2023-11-13 DIAGNOSIS — R2681 Unsteadiness on feet: Secondary | ICD-10-CM | POA: Insufficient documentation

## 2023-11-13 DIAGNOSIS — R4189 Other symptoms and signs involving cognitive functions and awareness: Secondary | ICD-10-CM

## 2023-12-30 ENCOUNTER — Telehealth: Payer: Self-pay | Admitting: Cardiology

## 2023-12-30 MED ORDER — SACUBITRIL-VALSARTAN 24-26 MG PO TABS: 1.0000 | ORAL_TABLET | Freq: Two times a day (BID) | ORAL | 0 refills | Status: AC

## 2023-12-30 MED ORDER — FUROSEMIDE 20 MG PO TABS: ORAL_TABLET | ORAL | 0 refills | Status: AC

## 2023-12-30 NOTE — Telephone Encounter (Signed)
*  STAT* If patient is at the pharmacy, call can be transferred to refill team.   1. Which medications need to be refilled? (please list name of each medication and dose if known)   furosemide  (LASIX ) 20 MG tablet    omeprazole  (PRILOSEC) 20 MG capsule    sacubitril -valsartan  (ENTRESTO ) 24-26 MG    Simethicone  125 MG CAPS     2. Would you like to learn more about the convenience, safety, & potential cost savings by using the [facility]? No   3. Are you open to using the [facility] (Type [facility].)No   4. Which pharmacy/location (including street and city if local pharmacy) is medication to be sent to?  [id number] - [place], [place] - [address] AT [facility] [facility] Phone: [phone]  Fax: [phone]       5. Do they need a 30 day or 90 day supply? 90 day

## 2023-12-30 NOTE — Telephone Encounter (Signed)
 Pt's medications were sent to pt's pharmacy as requested. Confirmation received.

## 2023-12-31 ENCOUNTER — Telehealth: Payer: Self-pay | Admitting: Family Medicine

## 2023-12-31 NOTE — Telephone Encounter (Unsigned)
 Copied from CRM [id number]. Topic: Clinical - Medication Refill >> Dec 31, 2023  8:33 AM Suzen RAMAN wrote: Medication: Zyprexa.... Please contact patient daughter with additional questions: Yaniyah Koors [phone]  Has the patient contacted their pharmacy? Yes  This is the patient's preferred pharmacy:   [facility] [address] [place] [address] [place], [place] [address] [phone]  Is this the correct pharmacy for this prescription? Yes If no, delete pharmacy and type the correct one.   Has the prescription been filled recently? No  Is the patient out of the medication? Yes  Has the patient been seen for an appointment in the last year OR does the patient have an upcoming appointment? Yes  Can we respond through MyChart? No  Agent: Please be advised that Rx refills may take up to 3 business days. We ask that you follow-up with your pharmacy.

## 2024-01-28 DIAGNOSIS — I5020 Unspecified systolic (congestive) heart failure: Secondary | ICD-10-CM | POA: Diagnosis not present

## 2024-01-28 DIAGNOSIS — R5383 Other fatigue: Secondary | ICD-10-CM | POA: Diagnosis not present

## 2024-01-28 DIAGNOSIS — R7309 Other abnormal glucose: Secondary | ICD-10-CM | POA: Diagnosis not present

## 2024-01-28 DIAGNOSIS — Z0000 Encounter for general adult medical examination without abnormal findings: Secondary | ICD-10-CM | POA: Diagnosis not present

## 2024-01-28 DIAGNOSIS — I10 Essential (primary) hypertension: Secondary | ICD-10-CM | POA: Diagnosis not present

## 2024-01-28 DIAGNOSIS — F0390 Unspecified dementia without behavioral disturbance: Secondary | ICD-10-CM | POA: Diagnosis not present

## 2024-01-28 DIAGNOSIS — R2689 Other abnormalities of gait and mobility: Secondary | ICD-10-CM | POA: Diagnosis not present

## 2024-02-01 DIAGNOSIS — Z111 Encounter for screening for respiratory tuberculosis: Secondary | ICD-10-CM | POA: Diagnosis not present

## 2024-02-01 DIAGNOSIS — R030 Elevated blood-pressure reading, without diagnosis of hypertension: Secondary | ICD-10-CM | POA: Diagnosis not present

## 2024-02-23 DIAGNOSIS — I110 Hypertensive heart disease with heart failure: Secondary | ICD-10-CM | POA: Diagnosis not present

## 2024-02-23 DIAGNOSIS — F03918 Unspecified dementia, unspecified severity, with other behavioral disturbance: Secondary | ICD-10-CM | POA: Diagnosis not present

## 2024-02-23 DIAGNOSIS — F0390 Unspecified dementia without behavioral disturbance: Secondary | ICD-10-CM | POA: Diagnosis not present

## 2024-02-23 DIAGNOSIS — F03911 Unspecified dementia, unspecified severity, with agitation: Secondary | ICD-10-CM | POA: Diagnosis not present

## 2024-02-23 DIAGNOSIS — Z634 Disappearance and death of family member: Secondary | ICD-10-CM | POA: Diagnosis not present

## 2024-02-23 DIAGNOSIS — R4182 Altered mental status, unspecified: Secondary | ICD-10-CM | POA: Diagnosis not present

## 2024-02-23 DIAGNOSIS — Z299 Encounter for prophylactic measures, unspecified: Secondary | ICD-10-CM | POA: Diagnosis not present

## 2024-02-23 DIAGNOSIS — Z9183 Wandering in diseases classified elsewhere: Secondary | ICD-10-CM | POA: Diagnosis not present

## 2024-02-23 DIAGNOSIS — K219 Gastro-esophageal reflux disease without esophagitis: Secondary | ICD-10-CM | POA: Diagnosis not present

## 2024-02-23 DIAGNOSIS — Z743 Need for continuous supervision: Secondary | ICD-10-CM | POA: Diagnosis not present

## 2024-02-23 DIAGNOSIS — R918 Other nonspecific abnormal finding of lung field: Secondary | ICD-10-CM | POA: Diagnosis not present

## 2024-02-23 DIAGNOSIS — Z79899 Other long term (current) drug therapy: Secondary | ICD-10-CM | POA: Diagnosis not present

## 2024-02-23 DIAGNOSIS — E876 Hypokalemia: Secondary | ICD-10-CM | POA: Diagnosis not present

## 2024-02-23 DIAGNOSIS — F4321 Adjustment disorder with depressed mood: Secondary | ICD-10-CM | POA: Diagnosis not present

## 2024-02-23 DIAGNOSIS — F4489 Other dissociative and conversion disorders: Secondary | ICD-10-CM | POA: Diagnosis not present

## 2024-02-23 DIAGNOSIS — F0393 Unspecified dementia, unspecified severity, with mood disturbance: Secondary | ICD-10-CM | POA: Diagnosis not present

## 2024-02-23 DIAGNOSIS — R9089 Other abnormal findings on diagnostic imaging of central nervous system: Secondary | ICD-10-CM | POA: Diagnosis not present

## 2024-02-23 DIAGNOSIS — I509 Heart failure, unspecified: Secondary | ICD-10-CM | POA: Diagnosis not present

## 2024-02-23 DIAGNOSIS — Z91199 Patient's noncompliance with other medical treatment and regimen due to unspecified reason: Secondary | ICD-10-CM | POA: Diagnosis not present

## 2024-02-23 DIAGNOSIS — F05 Delirium due to known physiological condition: Secondary | ICD-10-CM | POA: Diagnosis not present

## 2024-02-24 DIAGNOSIS — R918 Other nonspecific abnormal finding of lung field: Secondary | ICD-10-CM | POA: Diagnosis not present

## 2024-03-05 DIAGNOSIS — R451 Restlessness and agitation: Secondary | ICD-10-CM | POA: Diagnosis not present

## 2024-03-05 DIAGNOSIS — F4320 Adjustment disorder, unspecified: Secondary | ICD-10-CM | POA: Diagnosis not present

## 2024-03-05 DIAGNOSIS — R5383 Other fatigue: Secondary | ICD-10-CM | POA: Diagnosis not present

## 2024-03-12 DIAGNOSIS — Z111 Encounter for screening for respiratory tuberculosis: Secondary | ICD-10-CM | POA: Diagnosis not present

## 2024-03-16 DIAGNOSIS — F22 Delusional disorders: Secondary | ICD-10-CM | POA: Diagnosis not present

## 2024-03-16 DIAGNOSIS — F411 Generalized anxiety disorder: Secondary | ICD-10-CM | POA: Diagnosis not present

## 2024-03-16 DIAGNOSIS — F4325 Adjustment disorder with mixed disturbance of emotions and conduct: Secondary | ICD-10-CM | POA: Diagnosis not present

## 2024-03-16 DIAGNOSIS — F333 Major depressive disorder, recurrent, severe with psychotic symptoms: Secondary | ICD-10-CM | POA: Diagnosis not present

## 2024-03-23 DIAGNOSIS — F411 Generalized anxiety disorder: Secondary | ICD-10-CM | POA: Diagnosis not present

## 2024-03-23 DIAGNOSIS — F4325 Adjustment disorder with mixed disturbance of emotions and conduct: Secondary | ICD-10-CM | POA: Diagnosis not present

## 2024-03-23 DIAGNOSIS — F333 Major depressive disorder, recurrent, severe with psychotic symptoms: Secondary | ICD-10-CM | POA: Diagnosis not present

## 2024-03-23 DIAGNOSIS — F22 Delusional disorders: Secondary | ICD-10-CM | POA: Diagnosis not present

## 2024-03-25 DIAGNOSIS — R6889 Other general symptoms and signs: Secondary | ICD-10-CM | POA: Diagnosis not present

## 2024-03-25 DIAGNOSIS — R892 Abnormal level of other drugs, medicaments and biological substances in specimens from other organs, systems and tissues: Secondary | ICD-10-CM | POA: Diagnosis not present

## 2024-03-28 DIAGNOSIS — I5089 Other heart failure: Secondary | ICD-10-CM | POA: Diagnosis not present

## 2024-03-28 DIAGNOSIS — F0280 Dementia in other diseases classified elsewhere without behavioral disturbance: Secondary | ICD-10-CM | POA: Diagnosis not present

## 2024-03-28 DIAGNOSIS — Z7189 Other specified counseling: Secondary | ICD-10-CM | POA: Diagnosis not present

## 2024-03-28 DIAGNOSIS — K219 Gastro-esophageal reflux disease without esophagitis: Secondary | ICD-10-CM | POA: Diagnosis not present

## 2024-03-31 DIAGNOSIS — F411 Generalized anxiety disorder: Secondary | ICD-10-CM | POA: Diagnosis not present

## 2024-03-31 DIAGNOSIS — F4325 Adjustment disorder with mixed disturbance of emotions and conduct: Secondary | ICD-10-CM | POA: Diagnosis not present

## 2024-03-31 DIAGNOSIS — F333 Major depressive disorder, recurrent, severe with psychotic symptoms: Secondary | ICD-10-CM | POA: Diagnosis not present

## 2024-03-31 DIAGNOSIS — F22 Delusional disorders: Secondary | ICD-10-CM | POA: Diagnosis not present

## 2024-04-06 DIAGNOSIS — F4325 Adjustment disorder with mixed disturbance of emotions and conduct: Secondary | ICD-10-CM | POA: Diagnosis not present

## 2024-04-06 DIAGNOSIS — F22 Delusional disorders: Secondary | ICD-10-CM | POA: Diagnosis not present

## 2024-04-06 DIAGNOSIS — F411 Generalized anxiety disorder: Secondary | ICD-10-CM | POA: Diagnosis not present

## 2024-04-06 DIAGNOSIS — F333 Major depressive disorder, recurrent, severe with psychotic symptoms: Secondary | ICD-10-CM | POA: Diagnosis not present

## 2024-04-19 DIAGNOSIS — F411 Generalized anxiety disorder: Secondary | ICD-10-CM | POA: Diagnosis not present

## 2024-04-19 DIAGNOSIS — F4325 Adjustment disorder with mixed disturbance of emotions and conduct: Secondary | ICD-10-CM | POA: Diagnosis not present

## 2024-04-19 DIAGNOSIS — F22 Delusional disorders: Secondary | ICD-10-CM | POA: Diagnosis not present

## 2024-04-19 DIAGNOSIS — F333 Major depressive disorder, recurrent, severe with psychotic symptoms: Secondary | ICD-10-CM | POA: Diagnosis not present

## 2024-05-05 DIAGNOSIS — M542 Cervicalgia: Secondary | ICD-10-CM | POA: Diagnosis not present

## 2024-05-15 DIAGNOSIS — F411 Generalized anxiety disorder: Secondary | ICD-10-CM | POA: Diagnosis not present

## 2024-05-15 DIAGNOSIS — F22 Delusional disorders: Secondary | ICD-10-CM | POA: Diagnosis not present

## 2024-05-15 DIAGNOSIS — F4325 Adjustment disorder with mixed disturbance of emotions and conduct: Secondary | ICD-10-CM | POA: Diagnosis not present

## 2024-05-15 DIAGNOSIS — F333 Major depressive disorder, recurrent, severe with psychotic symptoms: Secondary | ICD-10-CM | POA: Diagnosis not present
